# Patient Record
Sex: Female | Born: 1935
Health system: Southern US, Community
[De-identification: ages and names within clinical notes are randomized; demographics above are authoritative.]

## PROBLEM LIST (undated history)

## (undated) DIAGNOSIS — E079 Disorder of thyroid, unspecified: Secondary | ICD-10-CM

## (undated) DIAGNOSIS — I10 Essential (primary) hypertension: Secondary | ICD-10-CM

## (undated) HISTORY — PX: ABDOMINAL HYSTERECTOMY: SHX81

## (undated) HISTORY — PX: TONSILLECTOMY: SUR1361

## (undated) HISTORY — PX: CHOLECYSTECTOMY: SHX55

---

## 1998-05-02 ENCOUNTER — Emergency Department (HOSPITAL_COMMUNITY): Admission: EM | Admit: 1998-05-02 | Discharge: 1998-05-02 | Payer: Self-pay | Admitting: Emergency Medicine

## 2001-05-24 ENCOUNTER — Inpatient Hospital Stay (HOSPITAL_COMMUNITY): Admission: EM | Admit: 2001-05-24 | Discharge: 2001-05-25 | Payer: Self-pay | Admitting: *Deleted

## 2001-05-24 ENCOUNTER — Encounter: Payer: Self-pay | Admitting: *Deleted

## 2001-05-25 ENCOUNTER — Encounter: Payer: Self-pay | Admitting: Cardiology

## 2002-07-26 ENCOUNTER — Other Ambulatory Visit: Admission: RE | Admit: 2002-07-26 | Discharge: 2002-07-26 | Payer: Self-pay | Admitting: Dermatology

## 2002-07-31 ENCOUNTER — Ambulatory Visit (HOSPITAL_COMMUNITY): Admission: RE | Admit: 2002-07-31 | Discharge: 2002-07-31 | Payer: Self-pay | Admitting: Family Medicine

## 2002-07-31 ENCOUNTER — Encounter: Payer: Self-pay | Admitting: Family Medicine

## 2002-08-08 ENCOUNTER — Encounter: Payer: Self-pay | Admitting: Family Medicine

## 2002-08-08 ENCOUNTER — Ambulatory Visit (HOSPITAL_COMMUNITY): Admission: RE | Admit: 2002-08-08 | Discharge: 2002-08-08 | Payer: Self-pay | Admitting: Family Medicine

## 2002-11-08 ENCOUNTER — Encounter: Payer: Self-pay | Admitting: Family Medicine

## 2002-11-08 ENCOUNTER — Ambulatory Visit (HOSPITAL_COMMUNITY): Admission: RE | Admit: 2002-11-08 | Discharge: 2002-11-08 | Payer: Self-pay | Admitting: Family Medicine

## 2003-08-21 ENCOUNTER — Ambulatory Visit (HOSPITAL_COMMUNITY): Admission: RE | Admit: 2003-08-21 | Discharge: 2003-08-21 | Payer: Self-pay | Admitting: Family Medicine

## 2005-02-06 ENCOUNTER — Ambulatory Visit (HOSPITAL_COMMUNITY): Admission: RE | Admit: 2005-02-06 | Discharge: 2005-02-06 | Payer: Self-pay | Admitting: Orthopedic Surgery

## 2007-02-01 ENCOUNTER — Ambulatory Visit (HOSPITAL_COMMUNITY): Admission: RE | Admit: 2007-02-01 | Discharge: 2007-02-01 | Payer: Self-pay | Admitting: Family Medicine

## 2007-02-15 ENCOUNTER — Ambulatory Visit (HOSPITAL_COMMUNITY): Admission: RE | Admit: 2007-02-15 | Discharge: 2007-02-15 | Payer: Self-pay | Admitting: Family Medicine

## 2008-06-30 ENCOUNTER — Emergency Department (HOSPITAL_COMMUNITY): Admission: EM | Admit: 2008-06-30 | Discharge: 2008-06-30 | Payer: Self-pay | Admitting: Emergency Medicine

## 2009-05-08 ENCOUNTER — Ambulatory Visit (HOSPITAL_COMMUNITY): Admission: RE | Admit: 2009-05-08 | Discharge: 2009-05-08 | Payer: Self-pay | Admitting: Family Medicine

## 2009-05-15 ENCOUNTER — Ambulatory Visit (HOSPITAL_COMMUNITY): Admission: RE | Admit: 2009-05-15 | Discharge: 2009-05-15 | Payer: Self-pay | Admitting: Family Medicine

## 2009-05-17 ENCOUNTER — Encounter (INDEPENDENT_AMBULATORY_CARE_PROVIDER_SITE_OTHER): Payer: Self-pay | Admitting: General Surgery

## 2009-05-17 ENCOUNTER — Ambulatory Visit (HOSPITAL_COMMUNITY): Admission: RE | Admit: 2009-05-17 | Discharge: 2009-05-17 | Payer: Self-pay | Admitting: General Surgery

## 2010-03-04 ENCOUNTER — Ambulatory Visit (HOSPITAL_COMMUNITY): Admission: RE | Admit: 2010-03-04 | Discharge: 2010-03-04 | Payer: Self-pay | Admitting: Internal Medicine

## 2011-01-17 LAB — CBC
HCT: 39.7 % (ref 36.0–46.0)
Hemoglobin: 13.8 g/dL (ref 12.0–15.0)
MCHC: 34.7 g/dL (ref 30.0–36.0)
MCV: 88.7 fL (ref 78.0–100.0)
RBC: 4.47 MIL/uL (ref 3.87–5.11)
RDW: 13.7 % (ref 11.5–15.5)
WBC: 5.6 10*3/uL (ref 4.0–10.5)

## 2011-01-17 LAB — HEPATIC FUNCTION PANEL
ALT: 21 U/L (ref 0–35)
Alkaline Phosphatase: 67 U/L (ref 39–117)

## 2011-01-17 LAB — BASIC METABOLIC PANEL
BUN: 13 mg/dL (ref 6–23)
Calcium: 9.9 mg/dL (ref 8.4–10.5)
Chloride: 101 mEq/L (ref 96–112)
Glucose, Bld: 113 mg/dL — ABNORMAL HIGH (ref 70–99)

## 2011-02-24 NOTE — Op Note (Signed)
NAMESHADE, RIVENBARK               ACCOUNT NO.:  [id number]   MEDICAL RECORD NO.:  [id number]          PATIENT TYPE:  AMB   LOCATION:  DAY                           FACILITY:  [facility]   PHYSICIAN:  Dalia Heading, M.D.  DATE OF BIRTH:  [birth year]   DATE OF PROCEDURE:  05/17/2009  DATE OF DISCHARGE:                               OPERATIVE REPORT   PREOPERATIVE DIAGNOSES:  Cholecystitis and cholelithiasis.   POSTOPERATIVE DIAGNOSIS:  Cholecystitis and cholelithiasis.   PROCEDURE:  Laparoscopic cholecystectomy.   SURGEON:  Dalia Heading, MD   ANESTHESIA:  General endotracheal.   INDICATIONS:  The patient is a 75 year old white female who presents  with biliary colic secondary to cholelithiasis.  The risks and benefits  of the procedure including bleeding, infection, hepatobiliary injury,  the possibly of an open procedure were fully explained to the patient,  gave informed consent.   PROCEDURE NOTE:  The patient was placed in the supine position.  After  induction of general endotracheal anesthesia, the abdomen was prepped  and draped in the usual sterile technique with Betadine.  Surgical site  confirmation was performed.   An infraumbilical incision was made down to the fascia.  A Veress needle  was introduced into the abdominal cavity and confirmation of placement  was done using the saline drop test.  The abdomen was then insufflated  to 16 mmHg pressure.  An 11-mm trocar was introduced into the abdominal  cavity under direct visualization without difficulty.  The patient was  placed in reverse Trendelenburg position.  Additional 11-mm trocar was  placed in the epigastric region, and 5-mm trocar was placed in the right  upper quadrant and right flank regions.  Liver was inspected and noted  to be within normal limits.  The gallbladder was retracted superior and  laterally.  The dissection was begun around the infundibulum of the  gallbladder.  The cystic duct was first  identified.  Its juncture to the  infundibulum fully identified.  EndoClips were placed proximally and  distally on the cystic duct, and the cystic duct was divided.  This was  likewise done in the cystic artery.  The gallbladder was then freed away  from the gallbladder fossa using Bovie electrocautery.  The gallbladder  was delivered through the epigastric trocar site using Endo Catch bag.  The gallbladder fossa was inspected.  No abnormal bleeding or bile  leakage was noted.  Surgicel was placed in the gallbladder fossa.  All  fluid and air were then evacuated from the abdominal cavity prior to the  removal of the trocars.   All wounds were irrigated with normal saline.  All wounds were injected  with 0.5% Sensorcaine.  The infraumbilical fascia was reapproximated  using an 0 Vicryl interrupted suture.  All skin incisions were closed  using staples.  Betadine ointment and dry sterile dressings were  applied.   All tape and needle counts were correct at the end of the procedure.  The patient was extubated in the operating room and went back to  recovery room awake in stable condition.   COMPLICATIONS:  None.   SPECIMEN:  Gallbladder.   BLOOD LOSS:  Minimal.      Dalia Heading, M.D.  Electronically Signed     MAJ/MEDQ  D:  05/17/2009  T:  05/17/2009  Job:  [id number]   cc:   Kirk Ruths, M.D.  Fax: [phone]

## 2011-02-24 NOTE — H&P (Signed)
NAMENECHELLE, Sherry Waller               ACCOUNT NO.:  [id number]   MEDICAL RECORD NO.:  [id number]          PATIENT TYPE:  AMB   LOCATION:  DAY                           FACILITY:  [facility]   PHYSICIAN:  Dalia Heading, M.D.  DATE OF BIRTH:  1935/10/20   DATE OF ADMISSION:  DATE OF DISCHARGE:  LH                              HISTORY & PHYSICAL   CHIEF COMPLAINT:  Cholecystitis, cholelithiasis.   HISTORY OF PRESENT ILLNESS:  The patient is a 75 year old white female  who is referred for evaluation and treatment of biliary colic secondary  to cholelithiasis.  She has been having intermittent right upper  quadrant abdominal pain with radiation to the flank and right shoulder,  nausea, and indigestion for the past few months.  It is made worse with  fatty foods.  No fever, chills, jaundice have been noted.   PAST MEDICAL HISTORY:  Hypertension.   PAST SURGICAL HISTORY:  Hysterectomy.   CURRENT MEDICATIONS:  1. Lopressor 100 mg p.o. daily.  2. Lisinopril 20 mg p.o. daily.   ALLERGIES:  1. CODEINE.  2. SULFA.   REVIEW OF SYSTEMS:  Noncontributory.   PHYSICAL EXAMINATION:  GENERAL:  On physical examination, the patient is  a well-developed, well-nourished white female in no acute distress.  HEENT: Examination reveals no scleral icterus.  LUNGS:  Clear to auscultation with equal breath sounds bilaterally.  HEART:  Examination reveals regular rate and rhythm without S3, S4, or  murmurs.  ABDOMEN:  The abdomen is soft with tenderness on the right upper  quadrant to palpation.  No hepatosplenomegaly, masses, hernias are  identified.   Ultrasound of the gallbladder reveals cholelithiasis with a normal  common bile duct.   IMPRESSION:  Cholecystitis, cholelithiasis.   PLAN:  The patient is scheduled for laparoscopic cholecystectomy on  May 17, 2009.  The risks and benefits of the procedure including  bleeding, infection, hepatobiliary injury, the possibly of an open  procedure were  fully explained to the patient, who gave informed  consent.      Dalia Heading, M.D.  Electronically Signed     MAJ/MEDQ  D:  05/16/2009  T:  05/16/2009  Job:  [id number]   cc:   Kirk Ruths, M.D.  Fax: [phone]

## 2011-02-27 NOTE — Discharge Summary (Signed)
[place]. [facility]  Patient:    Sherry Waller, Sherry Waller Visit Number: [id number] MRN: [id number]          Service Type: Attending:  Alvester Morin, M.D. Dictated by:   Lendell Caprice, M.D. Adm. Date:  05/24/01 Disc. Date: 05/25/01   CC:         Rollene Rotunda, M.D. [facility]  Karleen Hampshire, M.D., [place], [place]   Discharge Summary   DISCHARGE DIAGNOSIS: 1. Atypical chest pain, current hospitalization, likely musculoskeletal in nature. 2. Hypertension - poorly controlled. 3. S/P Hysterectomy. 4. Hyperglycemia Hgb Alc 5.3. 5. Hyperlipidemia LDL 138, HDL 41. (current hospitalization)  DISCHARGE MEDICATIONS: 1. Altace 5 mg p.o. q.d. 2. Lopressor 25 mg 1 p.o. b.i.d. 3. Zantac 150 mg 1 p.o. b.i.d. 4. Aspirin 325 mg 1 p.o. q.d. 5. Tylenol 325 mg 2 tabs p.o. q.4-6h. p.r.n. pain.  FOLLOWUP:  The patient will follow up with her primary care physician, Dr. Karleen Hampshire, M.D. [place] on [place] 20 at 10 a.m. She was also given the number to our outpatient internal medicine clinic, [phone] and told to call that clinic if she should have any problems.  In the meantime, the patients glucose will need to be followed in an outpatient setting.  Her hemoglobin A1c was 5.3.  PROCEDURES AND DIAGNOSTIC STUDIES: 1. On May 24, 2001, a chest x-ray was performed which showed no acute    process, no acute active disease. 2. Cardiolite stress test was also performed on May 25, 2001, which showed    an ejection fraction of 67% and no evidence of ischemia.  There was some    evidence of attenuation secondary to breast tissue.  This was a normal    study per the cardiologist.  The exercise stress test was clinically and    electrically negative.  CONSULTANTS:  Dr. Antoine Poche in cardiology.  HISTORY OF PRESENT ILLNESS:  The patient is a 75 year old white female with a history of hypertension uncontrolled.  She presents with right-sided chest pain that  radiates to her back.  She rates it as a 6 out of 10 and describes it as sudden onset.  She has no associated diaphoresis, nausea, vomiting, jaw pain, or palpitations.  She first noticed her back pain last week.  On the night of May 22, 2001, she had chest pain as well and states that she could not sleep because of this pain.  She states that the pain is reproducible if she presses on her chest and she feels that she could stretch it out if she could.  She denies any shortness of breath or dyspnea on exertion.  She has never had any pain like this before in her past.  Her current risk factors include history of high cholesterol and uncontrolled hypertension.  ADMISSION LABORATORY DATA:  CBC, white blood count 6.0, hemoglobin 13.8 and hematocrit 42.3, MCV 84.2, RDW 11.8, platelets 239, CK 128, CK-MB 0.6, troponin less than 0.01, second and third sets of enzymes were also negative. PT 12.3, INR 0.9, PTT 29.  Lipid profile, cholesterol 204, triglycerides 125, HDL 41, LDL 138, D-dimer test less than 0.22, hepatic functions normal, total bili 0.5, direct bili 0, indirect 0.5, alk phos 93, SGOT 19, SGPT 13, total protein 6.4, albumin 3.5, hemoglobin A1c 5.3.  HOSPITAL COURSE: #1 - CARDIAC/CHEST PAIN:  The patient was evaluated in the emergency room and admitted to the medicine service for acute workup of chest pain and rule out of acute myocardial infarction.  Enzymes were all negative x 3 and her EKG showed no changes and was normal.  She was started on a heparin and nitroglycerin drip.  She was also given Morphine for pain and baby aspirin. The patient was seen and evaluated by cardiology and a Cardiolite study was order for May 25, 2001.  D-dimer was obtained, however, the suspicion for a pulmonary embolus was very low.  This was negative.  A lipid profile was also checked and the following LDL of 138 was obtained.  No medication was recommended at this time.  The patient agreed to  management with diet and exercise in the outpatient setting.  The results of the Cardiolite stress test were normal.  Her chest pain was likely musculoskeletal in nature.  She was given Tylenol p.r.n. for pain and instructed to follow up in the outpatient setting with her family physician.  #2 - UNCONTROLLED HYPERTENSION:  The patient states that in the past she has been on Plendil, prescribed by her physician, but she has actually never filled this medication.  She was instructed on the importance of taking a medication for her uncontrolled blood pressure.  She was started on Lopressor 25 mg p.o. b.i.d. during her hospitalization.  Her blood pressures ranged in the 130s to the 170s during her stay.  She was discharged on this medication and also started on Altace 5 mg p.o. q.d. for her hypertension.  This will need to be followed in the outpatient setting by Dr. Regino Schultze in [place]. He will need to check her electrolytes and renal function after staring the ACE inhibitor.  #3 - HYPERGLYCEMIA:  Patient has no history of diabetes and denies any polyuria or polydipsia or blurry vision.  Hemoglobin A1c was encouraging at the value of 5.3.  This will need to be followed in the outpatient setting as well.  #4 - HYPERLIPIDEMIA:  The patients LDL should be followed in the outpatient setting.  She will begin to watch her diet and eat low cholesterol foods.  She also states that she will continue to exercise daily.  This will need to be followed in the outpatient setting.  The patient was also started on aspirin 325 mg p.o. q.d.  We also prescribed Zantac 150 mg p.o. b.i.d. for probable dyspepsia.  DISCHARGE LABORATORY DATA:  White blood count 7.0, hemoglobin 13.1, hematocrit 37.9, MCV 83.7, RDW 12.1, platelet count 239, PTT 76. Dictated by:   Lendell Caprice, M.D. Attending:  Alvester Morin, M.D. DD:  05/25/01 TD:  05/25/01  Job: [id number]

## 2011-04-20 ENCOUNTER — Other Ambulatory Visit: Payer: Self-pay

## 2011-04-20 ENCOUNTER — Emergency Department (HOSPITAL_COMMUNITY): Payer: Medicare Other

## 2011-04-20 ENCOUNTER — Emergency Department (HOSPITAL_COMMUNITY)
Admission: EM | Admit: 2011-04-20 | Discharge: 2011-04-20 | Disposition: A | Discharge status: Home / Self Care | Payer: Medicare Other | Attending: Emergency Medicine | Admitting: Emergency Medicine

## 2011-04-20 DIAGNOSIS — T6391XA Toxic effect of contact with unspecified venomous animal, accidental (unintentional), initial encounter: Secondary | ICD-10-CM | POA: Insufficient documentation

## 2011-04-20 DIAGNOSIS — T63461A Toxic effect of venom of wasps, accidental (unintentional), initial encounter: Secondary | ICD-10-CM | POA: Insufficient documentation

## 2011-04-20 DIAGNOSIS — I10 Essential (primary) hypertension: Secondary | ICD-10-CM | POA: Insufficient documentation

## 2011-04-20 DIAGNOSIS — IMO0001 Reserved for inherently not codable concepts without codable children: Secondary | ICD-10-CM

## 2011-04-20 HISTORY — DX: Essential (primary) hypertension: I10

## 2011-04-20 MED ORDER — SODIUM CHLORIDE 0.9 % IV SOLN
INTRAVENOUS | Status: DC
  Administered 2011-04-20 (×2): via INTRAVENOUS

## 2011-04-20 MED ORDER — DEXAMETHASONE SODIUM PHOSPHATE 10 MG/ML IJ SOLN
10.0000 mg | Freq: Once | INTRAMUSCULAR | Status: DC
  Filled 2011-04-20: qty 1

## 2011-04-20 MED ORDER — DEXAMETHASONE SODIUM PHOSPHATE 10 MG/ML IJ SOLN: 10.0000 mg | Freq: Once | INTRAMUSCULAR | Status: DC

## 2011-04-20 MED ORDER — DIPHENHYDRAMINE HCL 25 MG PO CAPS: 25.0000 mg | ORAL_CAPSULE | Freq: Four times a day (QID) | ORAL | Status: AC | PRN

## 2011-04-20 MED ORDER — DEXAMETHASONE SODIUM PHOSPHATE 4 MG/ML IJ SOLN
10.0000 mg | Freq: Once | INTRAMUSCULAR | Status: AC
  Administered 2011-04-20: 10 mg via INTRAVENOUS
  Filled 2011-04-20: qty 3

## 2011-04-20 MED ORDER — PREDNISONE 10 MG PO TABS: ORAL_TABLET | ORAL | Status: DC

## 2011-04-20 MED ORDER — DIPHENHYDRAMINE HCL 50 MG/ML IJ SOLN
50.0000 mg | Freq: Once | INTRAMUSCULAR | Status: AC
  Administered 2011-04-20: 50 mg via INTRAVENOUS
  Filled 2011-04-20: qty 1

## 2011-04-20 MED ORDER — LORAZEPAM 1 MG PO TABS
1.0000 mg | ORAL_TABLET | Freq: Once | ORAL | Status: AC
  Administered 2011-04-20: 1 mg via ORAL
  Filled 2011-04-20: qty 1

## 2011-04-20 MED ORDER — FAMOTIDINE IN NACL 20-0.9 MG/50ML-% IV SOLN
20.0000 mg | Freq: Once | INTRAVENOUS | Status: AC
  Administered 2011-04-20: 20 mg via INTRAVENOUS
  Filled 2011-04-20: qty 50

## 2011-04-20 NOTE — ED Provider Notes (Addendum)
History     Chief Complaint  Patient presents with  . Insect Bite   HPI Comments: Patient was stung by a yellow jacket on her right index finger and hand about 30 minutes prior to arrival. She reports redness and swelling to hand and as generalized itching.  She also describes feeling a "knot" in her mid sternal area,  Describing it feels like she swallowed a piece of apple that "got stuck".  She denies shortness of breath,  Denies diaphoresis, no wheezing.  She reports having a history of "allergic reactions" to bee stings.  She denies history of anaphylaxis.  Patient is a 75 y.o. female presenting with allergic reaction. The history is provided by the patient.  Allergic Reaction The primary symptoms do not include wheezing, shortness of breath or cough. The current episode started less than 1 hour ago. The problem has not changed since onset. The onset of the reaction was associated with insect bite/sting.    Past Medical History  Diagnosis Date  . Hypertension     Past Surgical History  Procedure Date  . Cholecystectomy   . Abdominal hysterectomy     History reviewed. No pertinent family history.  History  Substance Use Topics  . Smoking status: Never Smoker   . Smokeless tobacco: Not on file  . Alcohol Use: No    OB History    Grav Para Term Preterm Abortions TAB SAB Ect Mult Living                  Review of Systems  Constitutional: Negative.   HENT: Negative for facial swelling.   Eyes: Negative.   Respiratory: Negative for apnea, cough, choking, shortness of breath, wheezing and stridor.   Gastrointestinal: Negative.   Genitourinary: Negative.   Neurological: Negative.   Hematological: Negative.   Psychiatric/Behavioral: Negative.     Physical Exam  BP 196/76  Pulse 88  Temp(Src) 97.6 F (36.4 C) (Oral)  Resp 24  SpO2 94%  Physical Exam  Constitutional: She is oriented to person, place, and time. She appears well-developed and well-nourished.    HENT:  Head: Normocephalic and atraumatic.  Neck: Normal range of motion. Neck supple.  Cardiovascular: Normal rate, regular rhythm, normal heart sounds and intact distal pulses.   Pulmonary/Chest: Effort normal and breath sounds normal.  Abdominal: Bowel sounds are normal.  Musculoskeletal: She exhibits edema and tenderness.       Right wrist: She exhibits tenderness and swelling.  Neurological: She is alert and oriented to person, place, and time.  Skin: Skin is warm and dry.  Psychiatric: Her mood appears anxious.    ED Course  Procedures  MDM Patient was given IV fluids,  Decadron 10 mg IV,  Benadryl 50 mg IV with no further sx and resolution of itching.  She was anxious, was given ativan 1 mg,  bp initially elevated,  Improved at dc.    Date: 04/20/2011  Rate: 79  Rhythm: normal sinus rhythm  QRS Axis: normal  Intervals: normal  ST/T Wave abnormalities: normal  Conduction Disutrbances:none  Narrative Interpretation:   Old EKG Reviewed: unchanged    Candis Musa, PA 04/20/11 1812  Candis Musa, PA 04/20/11 1830  Medical screening examination/treatment/procedure(s) were performed by non-physician practitioner and as supervising physician I was immediately available for consultation/collaboration.   Sunnie Nielsen, MD 05/21/11 [phone]

## 2011-04-20 NOTE — ED Notes (Signed)
Patient resting quietly in bed. Patient moved to room 6. Airway patent. Respirations even and nonlabored. No acute distress noted. No verbalized requests given.

## 2011-04-20 NOTE — ED Notes (Signed)
Pt presents with right hand swelling and redness, chest tightness, and SOB, itching "all over". Pt was stung by bee and pt allergic to bees.

## 2011-04-21 ENCOUNTER — Other Ambulatory Visit (HOSPITAL_COMMUNITY): Payer: Self-pay | Admitting: Family Medicine

## 2011-04-21 DIAGNOSIS — Z139 Encounter for screening, unspecified: Secondary | ICD-10-CM

## 2011-04-23 ENCOUNTER — Ambulatory Visit (HOSPITAL_COMMUNITY)
Admission: RE | Admit: 2011-04-23 | Discharge: 2011-04-23 | Disposition: A | Discharge status: Home / Self Care | Payer: Medicare Other | Source: Ambulatory Visit | Attending: Family Medicine | Admitting: Family Medicine

## 2011-04-23 DIAGNOSIS — Z1231 Encounter for screening mammogram for malignant neoplasm of breast: Secondary | ICD-10-CM | POA: Insufficient documentation

## 2011-04-23 DIAGNOSIS — Z139 Encounter for screening, unspecified: Secondary | ICD-10-CM

## 2011-05-27 ENCOUNTER — Encounter (INDEPENDENT_AMBULATORY_CARE_PROVIDER_SITE_OTHER): Payer: Self-pay | Admitting: *Deleted

## 2012-07-25 ENCOUNTER — Ambulatory Visit
Admission: RE | Admit: 2012-07-25 | Discharge: 2012-07-25 | Disposition: A | Discharge status: Home / Self Care | Payer: Medicare Other | Source: Ambulatory Visit | Attending: Chiropractor | Admitting: Chiropractor

## 2012-07-25 ENCOUNTER — Other Ambulatory Visit: Payer: Self-pay | Admitting: Chiropractic Medicine

## 2012-07-25 DIAGNOSIS — R52 Pain, unspecified: Secondary | ICD-10-CM

## 2013-04-19 ENCOUNTER — Other Ambulatory Visit (HOSPITAL_COMMUNITY): Payer: Self-pay | Admitting: Family Medicine

## 2013-04-19 DIAGNOSIS — Z139 Encounter for screening, unspecified: Secondary | ICD-10-CM

## 2013-04-24 ENCOUNTER — Ambulatory Visit (HOSPITAL_COMMUNITY)
Admission: RE | Admit: 2013-04-24 | Discharge: 2013-04-24 | Disposition: A | Discharge status: Home / Self Care | Payer: Medicare Other | Source: Ambulatory Visit | Attending: Family Medicine | Admitting: Family Medicine

## 2013-04-24 DIAGNOSIS — Z1231 Encounter for screening mammogram for malignant neoplasm of breast: Secondary | ICD-10-CM | POA: Insufficient documentation

## 2013-04-24 DIAGNOSIS — Z139 Encounter for screening, unspecified: Secondary | ICD-10-CM

## 2013-08-16 ENCOUNTER — Encounter (HOSPITAL_COMMUNITY): Payer: Self-pay | Admitting: Emergency Medicine

## 2013-08-16 ENCOUNTER — Emergency Department (HOSPITAL_COMMUNITY)
Admission: EM | Admit: 2013-08-16 | Discharge: 2013-08-16 | Disposition: A | Discharge status: Home / Self Care | Payer: Medicare Other | Attending: Emergency Medicine | Admitting: Emergency Medicine

## 2013-08-16 DIAGNOSIS — E079 Disorder of thyroid, unspecified: Secondary | ICD-10-CM | POA: Insufficient documentation

## 2013-08-16 DIAGNOSIS — Z79899 Other long term (current) drug therapy: Secondary | ICD-10-CM | POA: Insufficient documentation

## 2013-08-16 DIAGNOSIS — I10 Essential (primary) hypertension: Secondary | ICD-10-CM | POA: Insufficient documentation

## 2013-08-16 HISTORY — DX: Disorder of thyroid, unspecified: E07.9

## 2013-08-16 LAB — CBC WITH DIFFERENTIAL/PLATELET
Basophils Relative: 1 % (ref 0–1)
Eosinophils Absolute: 0.2 10*3/uL (ref 0.0–0.7)
Eosinophils Relative: 3 % (ref 0–5)
HCT: 40.8 % (ref 36.0–46.0)
Lymphs Abs: 1.4 10*3/uL (ref 0.7–4.0)
MCH: 29.8 pg (ref 26.0–34.0)
MCHC: 34.1 g/dL (ref 30.0–36.0)
MCV: 87.6 fL (ref 78.0–100.0)
Monocytes Relative: 9 % (ref 3–12)
Platelets: 239 10*3/uL (ref 150–400)
RBC: 4.66 MIL/uL (ref 3.87–5.11)
RDW: 12.8 % (ref 11.5–15.5)

## 2013-08-16 LAB — BASIC METABOLIC PANEL
BUN: 20 mg/dL (ref 6–23)
Calcium: 10.2 mg/dL (ref 8.4–10.5)
GFR calc non Af Amer: 45 mL/min — ABNORMAL LOW (ref >90)
Glucose, Bld: 126 mg/dL — ABNORMAL HIGH (ref 70–99)
Sodium: 136 mEq/L (ref 135–145)

## 2013-08-16 NOTE — ED Notes (Signed)
Pt reports that her potassium has been high in the past and she thinks may be related.

## 2013-08-16 NOTE — ED Provider Notes (Signed)
CSN: [id number]     Arrival date & time 08/16/13  1211 History   First MD Initiated Contact with Patient 08/16/13 1511    Scribed for No att. providers found, the patient was seen in room APA01/APA01. This chart was scribed by Lewanda Rife, ED scribe. Patient's care was started at 3:15 PM  Chief Complaint  Patient presents with  . Hypertension   (Consider location/radiation/quality/duration/timing/severity/associated sxs/prior Treatment) The history is provided by the patient and medical records. No language interpreter was used.   HPI Comments: Sherry Waller is a 77 y.o. female who presents to the Emergency Department with PMHx of HTN complaining of constant asymptomatic elevated blood pressure onset 13 days since annual physical with PCP 08/04/13. Reports systolic blood pressure has ranged from 160-180s since annual physical. Pt admits she did not check blood pressure regularly until appointment with PCP. Denies associated fever, chest pain, headaches, and shortness of breath. Denies any aggravating factors. Reports taking HTN medications as prescribed, but admits to having an extra dose of medications no relief of symptoms.  Past Medical History  Diagnosis Date  . Hypertension   . Thyroid disease    Past Surgical History  Procedure Laterality Date  . Cholecystectomy    . Abdominal hysterectomy     No family history on file. History  Substance Use Topics  . Smoking status: Never Smoker   . Smokeless tobacco: Not on file  . Alcohol Use: No   OB History   Grav Para Term Preterm Abortions TAB SAB Ect Mult Living                 Review of Systems  Constitutional: Negative for fever.  Respiratory: Negative for shortness of breath.   Cardiovascular: Negative for chest pain.  Neurological: Negative for weakness and headaches.  Psychiatric/Behavioral: Negative for confusion.  All other systems reviewed and are negative.   A complete 10 system review of systems was  obtained and all systems are negative except as noted in the HPI and PMHx.    Allergies  Sulfa antibiotics  Home Medications   Current Outpatient Rx  Name  Route  Sig  Dispense  Refill  . amLODipine-olmesartan (AZOR) 10-40 MG per tablet   Oral   Take 1 tablet by mouth daily.         Marland Kitchen levothyroxine (SYNTHROID, LEVOTHROID) 50 MCG tablet   Oral   Take 50 mcg by mouth daily before breakfast.         . metoprolol-hydrochlorothiazide (LOPRESSOR HCT) 100-25 MG per tablet   Oral   Take 1 tablet by mouth daily.          BP 187/68  Pulse 71  Temp(Src) 98.2 F (36.8 C) (Oral)  Resp 17  Ht 5\' 5"  (1.651 m)  Wt 165 lb (74.844 kg)  BMI 27.46 kg/m2  SpO2 98% Physical Exam  Nursing note and vitals reviewed. Constitutional: She is oriented to person, place, and time. She appears well-developed and well-nourished. No distress.  Awake, alert, nontoxic appearance with baseline speech for patient.  HENT:  Head: Normocephalic and atraumatic.  Mouth/Throat: No oropharyngeal exudate.  Eyes: EOM are normal. Pupils are equal, round, and reactive to light. Right eye exhibits no discharge. Left eye exhibits no discharge.  Neck: Neck supple. No tracheal deviation present.  Cardiovascular: Normal rate and regular rhythm.   No murmur heard. Pulmonary/Chest: Effort normal and breath sounds normal. No stridor. No respiratory distress. She has no wheezes. She has no rales. She  exhibits no tenderness.  Abdominal: Soft. Bowel sounds are normal. She exhibits no mass. There is no tenderness. There is no rebound.  Musculoskeletal: Normal range of motion. She exhibits no tenderness.  Baseline ROM, moves extremities with no obvious new focal weakness.  Lymphadenopathy:    She has no cervical adenopathy.  Neurological: She is alert and oriented to person, place, and time.  Awake, alert, cooperative and aware of situation; motor strength 5/5 bilaterally; sensation normal to light touch bilaterally;  peripheral visual fields full to confrontation; no facial asymmetry; tongue midline; major cranial nerves appear intact; no pronator drift, normal finger to nose bilaterally, baseline gait without new ataxia.  Skin: Skin is warm and dry. No rash noted.  Psychiatric: She has a normal mood and affect. Her behavior is normal.    ED Course  Procedures (including critical care time) 3:25 PM consult with PCP Dr. Phillips Odor to discuss pt's concerns about her blood pressure and possible change in dosage. Dr. Phillips Odor states he will f/u with pt tomorrow.  3:25 PM Nursing Notes Reviewed/ Care Coordinated  Reviewed Interpretation of Laboratory Data incorporated into ED treatment Discussed results and treatment plan with pt. Pt demonstrates understanding and agrees with plan.  Results for orders placed during the hospital encounter of 08/16/13  CBC WITH DIFFERENTIAL      Result Value Range   WBC 7.2  4.0 - 10.5 K/uL   RBC 4.66  3.87 - 5.11 MIL/uL   Hemoglobin 13.9  12.0 - 15.0 g/dL   HCT 04.5  40.9 - 81.1 %   MCV 87.6  78.0 - 100.0 fL   MCH 29.8  26.0 - 34.0 pg   MCHC 34.1  30.0 - 36.0 g/dL   RDW 91.4  78.2 - 95.6 %   Platelets 239  150 - 400 K/uL   Neutrophils Relative % 69  43 - 77 %   Neutro Abs 4.9  1.7 - 7.7 K/uL   Lymphocytes Relative 19  12 - 46 %   Lymphs Abs 1.4  0.7 - 4.0 K/uL   Monocytes Relative 9  3 - 12 %   Monocytes Absolute 0.6  0.1 - 1.0 K/uL   Eosinophils Relative 3  0 - 5 %   Eosinophils Absolute 0.2  0.0 - 0.7 K/uL   Basophils Relative 1  0 - 1 %   Basophils Absolute 0.1  0.0 - 0.1 K/uL  BASIC METABOLIC PANEL      Result Value Range   Sodium 136  135 - 145 mEq/L   Potassium 4.7  3.5 - 5.1 mEq/L   Chloride 99  96 - 112 mEq/L   CO2 28  19 - 32 mEq/L   Glucose, Bld 126 (*) 70 - 99 mg/dL   BUN 20  6 - 23 mg/dL   Creatinine, Ser 2.13 (*) 0.50 - 1.10 mg/dL   Calcium 08.6  8.4 - 57.8 mg/dL   GFR calc non Af Amer 45 (*) >90 mL/min   GFR calc Af Amer 53 (*) >90 mL/min     Imaging Review No results found.  EKG Interpretation   None       MDM   1. Hypertension    I doubt any other EMC precluding discharge at this time including, but not necessarily limited to the following:HTN crisis.  I personally performed the services described in this documentation, which was scribed in my presence. The recorded information has been reviewed and is accurate.    Hurman Horn, MD  08/22/13 2129 

## 2013-08-16 NOTE — ED Notes (Signed)
Pt reports her bp being high at home today. She thinks is has been high since oct. 24th.   Her machine would not read at home this am. She took extra dose of her bp med at home prior to coming to the ed.

## 2013-08-16 NOTE — ED Notes (Signed)
Discharge instructions reviewed with pt, questions answered. Pt verbalized understanding.  

## 2014-01-24 ENCOUNTER — Other Ambulatory Visit (HOSPITAL_COMMUNITY): Payer: Self-pay | Admitting: Family Medicine

## 2014-01-24 DIAGNOSIS — Z139 Encounter for screening, unspecified: Secondary | ICD-10-CM

## 2014-02-02 ENCOUNTER — Ambulatory Visit (HOSPITAL_COMMUNITY)
Admission: RE | Admit: 2014-02-02 | Discharge: 2014-02-02 | Disposition: A | Discharge status: Home / Self Care | Payer: Medicare Other | Source: Ambulatory Visit | Attending: Family Medicine | Admitting: Family Medicine

## 2014-02-02 DIAGNOSIS — Z139 Encounter for screening, unspecified: Secondary | ICD-10-CM

## 2014-11-29 ENCOUNTER — Other Ambulatory Visit (HOSPITAL_COMMUNITY): Payer: Self-pay | Admitting: Family Medicine

## 2014-11-29 DIAGNOSIS — Z1231 Encounter for screening mammogram for malignant neoplasm of breast: Secondary | ICD-10-CM

## 2014-12-05 ENCOUNTER — Ambulatory Visit (HOSPITAL_COMMUNITY)
Admission: RE | Admit: 2014-12-05 | Discharge: 2014-12-05 | Disposition: A | Discharge status: Home / Self Care | Payer: Medicare Other | Source: Ambulatory Visit | Attending: Family Medicine | Admitting: Family Medicine

## 2014-12-05 DIAGNOSIS — Z1231 Encounter for screening mammogram for malignant neoplasm of breast: Secondary | ICD-10-CM

## 2015-04-18 ENCOUNTER — Ambulatory Visit (INDEPENDENT_AMBULATORY_CARE_PROVIDER_SITE_OTHER): Payer: Medicare Other | Admitting: Podiatry

## 2015-04-18 ENCOUNTER — Encounter: Payer: Self-pay | Admitting: Podiatry

## 2015-04-18 ENCOUNTER — Ambulatory Visit (INDEPENDENT_AMBULATORY_CARE_PROVIDER_SITE_OTHER): Payer: Medicare Other

## 2015-04-18 VITALS — BP 172/66 | HR 70 | Resp 12

## 2015-04-18 DIAGNOSIS — M779 Enthesopathy, unspecified: Secondary | ICD-10-CM | POA: Diagnosis not present

## 2015-04-18 DIAGNOSIS — R52 Pain, unspecified: Secondary | ICD-10-CM | POA: Diagnosis not present

## 2015-04-18 DIAGNOSIS — G629 Polyneuropathy, unspecified: Secondary | ICD-10-CM | POA: Diagnosis not present

## 2015-04-18 NOTE — Progress Notes (Signed)
   Subjective:    Patient ID: Sherry Waller, female    DOB: [birth year], 79 y.o.   MRN: [id number]  HPI Pt stated b/l feet having numbness feeling and weakness on the ankle for 4 years. Feet are getting worse when sitting/standing. Tried ice but no help.    Review of Systems  Musculoskeletal: Positive for gait problem.  All other systems reviewed and are negative.      Objective:   Physical Exam        Assessment & Plan:

## 2015-04-18 NOTE — Progress Notes (Signed)
Subjective:     Patient ID: Sherry Waller, female   DOB: April 08, 1936, 79 y.o.   MRN: [id number]  HPI patient presents stating I have had numbness in my forefeet for about 4 years and I did go to a chiropractor who was not able to help me and it seems to be getting worse but I do not have at this time any balance issues   Review of Systems  All other systems reviewed and are negative.      Objective:   Physical Exam  Constitutional: She is oriented to person, place, and time.  Cardiovascular: Intact distal pulses.   Musculoskeletal: Normal range of motion.  Neurological: She is oriented to person, place, and time.  Skin: Skin is warm and dry.  Nursing note and vitals reviewed.  I noted neurological sensation to be severely diminished with diminished sharp Dole vibratory bilateral and sharp dull testing. Patient's noted to have moderate cavus foot type has good digital perfusion and is well oriented 3     Assessment:     Idiopathic neuropathy with failure so far to respond to conservative care    Plan:     H&P and x-rays reviewed and I do not recommend aggressive treatment as she only has numbness and no burning no tingling or balance issues. May require balance braces at one point in the future if she starts to lose her balance and I instructed her on proprioception exercises

## 2016-02-17 DIAGNOSIS — M13879 Other specified arthritis, unspecified ankle and foot: Secondary | ICD-10-CM | POA: Diagnosis not present

## 2016-02-17 DIAGNOSIS — G64 Other disorders of peripheral nervous system: Secondary | ICD-10-CM | POA: Diagnosis not present

## 2016-02-17 DIAGNOSIS — M25571 Pain in right ankle and joints of right foot: Secondary | ICD-10-CM | POA: Diagnosis not present

## 2016-02-26 DIAGNOSIS — M9902 Segmental and somatic dysfunction of thoracic region: Secondary | ICD-10-CM | POA: Diagnosis not present

## 2016-02-26 DIAGNOSIS — M5441 Lumbago with sciatica, right side: Secondary | ICD-10-CM | POA: Diagnosis not present

## 2016-02-26 DIAGNOSIS — M9905 Segmental and somatic dysfunction of pelvic region: Secondary | ICD-10-CM | POA: Diagnosis not present

## 2016-02-26 DIAGNOSIS — M9903 Segmental and somatic dysfunction of lumbar region: Secondary | ICD-10-CM | POA: Diagnosis not present

## 2016-03-02 DIAGNOSIS — M9903 Segmental and somatic dysfunction of lumbar region: Secondary | ICD-10-CM | POA: Diagnosis not present

## 2016-03-02 DIAGNOSIS — M9905 Segmental and somatic dysfunction of pelvic region: Secondary | ICD-10-CM | POA: Diagnosis not present

## 2016-03-02 DIAGNOSIS — M9902 Segmental and somatic dysfunction of thoracic region: Secondary | ICD-10-CM | POA: Diagnosis not present

## 2016-03-02 DIAGNOSIS — M5441 Lumbago with sciatica, right side: Secondary | ICD-10-CM | POA: Diagnosis not present

## 2016-03-10 DIAGNOSIS — M5441 Lumbago with sciatica, right side: Secondary | ICD-10-CM | POA: Diagnosis not present

## 2016-03-10 DIAGNOSIS — M9902 Segmental and somatic dysfunction of thoracic region: Secondary | ICD-10-CM | POA: Diagnosis not present

## 2016-03-10 DIAGNOSIS — M9903 Segmental and somatic dysfunction of lumbar region: Secondary | ICD-10-CM | POA: Diagnosis not present

## 2016-03-10 DIAGNOSIS — M9905 Segmental and somatic dysfunction of pelvic region: Secondary | ICD-10-CM | POA: Diagnosis not present

## 2016-03-18 DIAGNOSIS — L255 Unspecified contact dermatitis due to plants, except food: Secondary | ICD-10-CM | POA: Diagnosis not present

## 2016-03-18 DIAGNOSIS — Z1389 Encounter for screening for other disorder: Secondary | ICD-10-CM | POA: Diagnosis not present

## 2016-03-18 DIAGNOSIS — L299 Pruritus, unspecified: Secondary | ICD-10-CM | POA: Diagnosis not present

## 2016-03-18 DIAGNOSIS — E663 Overweight: Secondary | ICD-10-CM | POA: Diagnosis not present

## 2016-03-18 DIAGNOSIS — Z6827 Body mass index (BMI) 27.0-27.9, adult: Secondary | ICD-10-CM | POA: Diagnosis not present

## 2016-06-12 DIAGNOSIS — Z1389 Encounter for screening for other disorder: Secondary | ICD-10-CM | POA: Diagnosis not present

## 2016-06-12 DIAGNOSIS — R7309 Other abnormal glucose: Secondary | ICD-10-CM | POA: Diagnosis not present

## 2016-06-12 DIAGNOSIS — E039 Hypothyroidism, unspecified: Secondary | ICD-10-CM | POA: Diagnosis not present

## 2016-06-12 DIAGNOSIS — R200 Anesthesia of skin: Secondary | ICD-10-CM | POA: Diagnosis not present

## 2016-06-12 DIAGNOSIS — I10 Essential (primary) hypertension: Secondary | ICD-10-CM | POA: Diagnosis not present

## 2016-06-12 DIAGNOSIS — E782 Mixed hyperlipidemia: Secondary | ICD-10-CM | POA: Diagnosis not present

## 2016-06-12 DIAGNOSIS — Z6827 Body mass index (BMI) 27.0-27.9, adult: Secondary | ICD-10-CM | POA: Diagnosis not present

## 2016-07-09 ENCOUNTER — Ambulatory Visit (INDEPENDENT_AMBULATORY_CARE_PROVIDER_SITE_OTHER): Payer: Medicare HMO | Admitting: Neurology

## 2016-07-09 ENCOUNTER — Encounter: Payer: Self-pay | Admitting: Neurology

## 2016-07-09 VITALS — BP 170/72 | HR 72 | Resp 16 | Ht 65.0 in | Wt 161.0 lb

## 2016-07-09 DIAGNOSIS — M79604 Pain in right leg: Secondary | ICD-10-CM

## 2016-07-09 DIAGNOSIS — M79605 Pain in left leg: Secondary | ICD-10-CM

## 2016-07-09 DIAGNOSIS — G629 Polyneuropathy, unspecified: Secondary | ICD-10-CM

## 2016-07-09 MED ORDER — GABAPENTIN 100 MG PO CAPS: ORAL_CAPSULE | ORAL | 5 refills | Status: DC

## 2016-07-09 NOTE — Progress Notes (Signed)
Subjective:    Patient ID: Sherry Waller is a 80 y.o. female.  HPI     Star Age, MD, PhD [facility] [address] [place], [place] [address]  Dear Dr. Hilma Favors,   I saw your patient, Sherry Waller, upon your kind request in my neurologic clinic today for initial consultation of her neuropathy. The patient is accompanied by her husband today today. As you know, Sherry Waller is a 80 year old right-handed woman with an underlying medical history of hypertension, hyperlipidemia, elevated blood sugar levels and hypothyroidism, who reports numbness and intermittent pain in both distal lower extremities for the past 5 years. Patient has had a borderline A1c for years, currently 6.1, per patient.  Symptoms started inBoth feet, in the forefoot area and toes areas with numbness and gradually over the course of years she felt the numbness affected her up to the ankles the mid shin area and now possibly up to the knees. She does recall that her mom had nerve related issues and reported numbness in her feet and legs but typically no pain. Patient denies any restless leg symptoms and pain is usually intermittent but when it does hurt it hurts really badly including sharp stabbing and knifelike pain. This can last up to 3 days at a time. She does not have pain every day thankfully. She tries to stay active and likes to walk. She denies low back pain and major joint pain. 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 She does not drink alcohol and drinks caffeine very limited, nonsmoker, never a heavy drinker. She has 8 siblings, 2 passed away, no sibling with neuropathy as far as she can tell.  Her weight has been stable typically. No history of Lyme disease. No history of toxin exposure as far she can recall.  I reviewed your office note from 06/12/2016, which you kindly included. She had blood work in your office at the time  including CBC with differential, CMP, A1c, lipid panel, TSH, vitamin B12 and vitamin D, with results pending, we will request test results from your office. She had seen a chiropractor in the past. Of note, she had acupuncture recently for the pain and she felt it helped. She has seen a podiatrist recently in July as well.  Her Past Medical History Is Significant For: Past Medical History:  Diagnosis Date  . Hypertension   . Thyroid disease     Her Past Surgical History Is Significant For: Past Surgical History:  Procedure Laterality Date  . ABDOMINAL HYSTERECTOMY    . CHOLECYSTECTOMY    . TONSILLECTOMY      Her Family History Is Significant For: Family History  Problem Relation Age of Onset  . Brain cancer Father   . Ovarian cancer Sister   . Brain cancer Sister     Her Social History Is Significant For: Social History   Social History  . Marital status: Married    Spouse name: N/A  . Number of children: 3  . Years of education: 12+   Social History Main Topics  . Smoking status: Never Smoker  . Smokeless tobacco: Never Used  . Alcohol use No  . Drug use: No  . Sexual activity: Yes    Birth control/ protection: Surgical   Other Topics Concern  . None   Social History Narrative   Drinks 1 cup of coffee a day     Her Allergies Are:  Allergies  Allergen Reactions  . Fenofibrate Other (See Comments)    Aches  . Statins Itching  . Sulfa Antibiotics Other (See Comments)  :   Her Current Medications Are:  Outpatient Encounter Prescriptions as of 07/09/2016  Medication Sig  . Alpha-Lipoic Acid 100 MG CAPS Take by mouth.  Marland Kitchen amLODipine-olmesartan (AZOR) 10-40 MG per tablet Take 1 tablet by mouth daily.  . Fish Oil OIL by Does not apply route.  Marland Kitchen ibuprofen (ADVIL,MOTRIN) 200 MG tablet Take 200 mg by mouth as needed.  Marland Kitchen levothyroxine (SYNTHROID, LEVOTHROID) 50 MCG tablet Take 50 mcg by mouth daily before breakfast.  . metoprolol-hydrochlorothiazide (LOPRESSOR  HCT) 100-25 MG per tablet Take 1 tablet by mouth daily.  . traMADol-acetaminophen (ULTRACET) 37.5-325 MG tablet    No facility-administered encounter medications on file as of 07/09/2016.   :   Review of Systems:  Out of a complete 14 point review of systems, all are reviewed and negative with the exception of these symptoms as listed below:  Review of Systems  Neurological:       Patient reports that she has neuropathy in both of her feet and legs. She has numbness up to her knees. Sherry Waller states that this has been going on for about 5 years.     Objective:  Neurologic Exam  Physical Exam Physical Examination:   Vitals:   07/09/16 1310  BP: (!) 170/72  Pulse: 72  Resp: 16    General Examination: The patient is a very pleasant 80 y.o. female in no acute distress. She appears well-developed and well-nourished and well groomed.   HEENT: Normocephalic, atraumatic, pupils are equal, round and reactive to light and accommodation. Funduscopic exam is normal with sharp disc margins noted. Extraocular tracking is good without limitation to gaze excursion or nystagmus noted. Normal smooth pursuit is noted. Hearing is grossly intact. Tympanic membranes are clear bilaterally. Face is symmetric with normal facial animation and normal facial sensation. Speech is clear with no dysarthria noted. There is no hypophonia. There is no lip, neck/head, jaw or voice tremor. Neck is supple with full range of passive and active motion. There are no carotid bruits on auscultation. Oropharynx exam reveals: mild mouth dryness, adequate dental hygiene  with full dentures on top and her own teeth on the bottom. Tongue protrudes centrally and palate elevates symmetrically.   Chest: Clear to auscultation without wheezing, rhonchi or crackles noted.  Heart: S1+S2+0, regular and normal without murmurs, rubs or gallops noted.   Abdomen: Soft, non-tender and non-distended with normal bowel sounds appreciated on  auscultation.  Extremities: There is no pitting edema in the distal lower extremities bilaterally. Pedal pulses are intact.  Skin: Warm and dry without trophic changes noted. There are no varicose veins, but she has prominent spider veins.  Musculoskeletal: exam reveals no obvious joint deformities, tenderness or joint swelling or erythema.   Neurologically:  Mental status: The patient is awake, alert and oriented in all 4 spheres. Her immediate and remote memory, attention, language skills and fund of knowledge are appropriate. There is no evidence of aphasia, agnosia, apraxia or anomia. Speech is clear with normal prosody and enunciation. Thought process is linear. Mood is normal and affect is normal.  Cranial nerves II - XII are as described above under HEENT exam. In addition: shoulder shrug is normal with equal shoulder height noted. Motor exam: Normal bulk, strength and tone is noted. There is no drift, tremor or rebound. Romberg is negative. Reflexes are 1-2+ in the  upper extremities, 1+ in the knees and absent in the ankles. Babinski: Toes are equivocal bilaterally. Fine motor skills and coordination: intact with normal finger taps, normal hand movements, normal rapid alternating patting, normal foot taps and normal foot agility.  Cerebellar testing: No dysmetria or intention tremor on finger to nose testing. Heel to shin is unremarkable bilaterally. There is no truncal or gait ataxia.  Sensory exam: intact to light touch, pinprick, vibration, temperature sense in the upperextremities but decrease to all modalities up to the knees bilaterally.  Gait, station and balance: She stands easily. No veering to one side is noted. No leaning to one side is noted. Posture is age-appropriate and stance is narrow based. Gait shows normal stride length and normal pace. No problems turning are noted. Tandem walk is slightly challenging for her, but good for age.  Assessment and Plan:   In summary, Sherry Waller is a very pleasant 80 y.o.-year old female  with an underlying medical history of hypertension, hyperlipidemia, elevated blood sugar levelsWith borderline A1c, and hypothyroidism, whose  history and physical exam are in keeping with peripheral neuropathy. Etiology could be in her case prediabetes her borderline diabetes. She had several blood tests in your office and we will request test results, we may add additional testing down the road. Patient is not very keen on any invasive testing at this time. Nevertheless, I suggested we proceed with EMG and nerve conduction study to further delineate her underlying problem. I talked to the patient and her husband at length about her symptoms, my findings and the diagnosis of peripheral neuropathy, I also pointed out to them that sometimes be do not find an etiology for this. We may consider additional blood work down the [place], skin biopsy, and lumbar puncture, however, she would like to avoid any procedures at this time but is agreeable to proceeding with EMG and nerve conduction testing. We will call her with her test results. She does not have pain every day which is fortunate. Nevertheless, she does have intermittent pain in this can be stabbing and quite severe. For this, I suggested as needed use of low-dose gabapentin 100 mg strength up to 3 pills a day. She was advised about potential side effects and limitations and expectations of the medication and provided a new prescription for this and written instructions. I would like to see her back in about 3 months, sooner as needed. We will be in touch as to her test results. I answered all their questions today and the patient and her husband were in agreement.  Thank you very much for allowing me to participate in the care of this nice patient. If I can be of any further assistance to you please do not hesitate to call me at [phone].  Sincerely,   Star Age, MD, PhD

## 2016-07-09 NOTE — Patient Instructions (Addendum)
You may have a condition called peripheral neuropathy, i. e. nerve damage. There is no specific treatment for most neuropathies. The most common cause for neuropathy is diabetes in this country, in which case, tight glucose control is key. Other causes include thyroid disease, and some vitamin deficiencies. Certain medications such as chemotherapy agents and other chemicals or toxins including alcohol can cause neuropathy. There are some genetic conditions or hereditary neuropathies. Typically patients will report a family history of neuropathy in those conditions. There are cases associated with cancers and autoimmune conditions. Most neuropathies are progressive unless a root cause can be found and treated. For most neuropathies there is no actual cure or reversing of symptoms. Painful neuropathy can be difficult to treat symptomatically, but there are some medications available to ease the symptoms. Electrophysiologic testing with nerve conduction velocity studies and EMG (muscle testing) do not always pick up neuropathies that affect the smallest fibers. Other common tests include different type of blood work, and rarely, spinal fluid testing, and sometimes we do nerve and muscle biopsy.  We will start as needed Neurontin (gabapentin) 100 mg strength: Take 1 pill up to 3 times a day as needed. The most common side effects reported are sedation or sleepiness. Rare side effects include balance problems, confusion.

## 2016-07-13 DIAGNOSIS — H5200 Hypermetropia, unspecified eye: Secondary | ICD-10-CM | POA: Diagnosis not present

## 2016-07-13 DIAGNOSIS — Z0100 Encounter for examination of eyes and vision without abnormal findings: Secondary | ICD-10-CM | POA: Diagnosis not present

## 2016-08-13 ENCOUNTER — Encounter (INDEPENDENT_AMBULATORY_CARE_PROVIDER_SITE_OTHER): Payer: Self-pay | Admitting: Diagnostic Neuroimaging

## 2016-08-13 ENCOUNTER — Ambulatory Visit (INDEPENDENT_AMBULATORY_CARE_PROVIDER_SITE_OTHER): Payer: Medicare HMO | Admitting: Diagnostic Neuroimaging

## 2016-08-13 DIAGNOSIS — G629 Polyneuropathy, unspecified: Secondary | ICD-10-CM | POA: Diagnosis not present

## 2016-08-13 DIAGNOSIS — Z0289 Encounter for other administrative examinations: Secondary | ICD-10-CM

## 2016-08-13 DIAGNOSIS — M79605 Pain in left leg: Secondary | ICD-10-CM

## 2016-08-13 DIAGNOSIS — M79604 Pain in right leg: Secondary | ICD-10-CM

## 2016-08-13 NOTE — Procedures (Signed)
   [facility]  NCS (NERVE CONDUCTION STUDY) WITH EMG (ELECTROMYOGRAPHY) REPORT   STUDY DATE: 08/13/16 PATIENT NAME: Sherry Waller DOB: December 20, 1935 MRN: [id number]  ORDERING CLINICIAN: Star Age, MD PhD   TECHNOLOGIST: Laretta Alstrom  ELECTROMYOGRAPHER: Earlean Polka. Isak Sotomayor, MD  CLINICAL INFORMATION: 80 year old female with bilateral foot numbness and pain for past 5 years. Patient denies low back pain or radicular symptoms.   FINDINGS: NERVE CONDUCTION STUDY: Bilateral peroneal and tibial motor responses and F wave latencies are normal. Bilateral H reflex responses are normal. Bilateral peroneal sensory responses are normal.   NEEDLE ELECTROMYOGRAPHY: Needle examination of right lower extremity vastus medialis, tibialis anterior, gastrocnemius muscles is normal.   IMPRESSION:  This is a normal study. No electrodiagnostic evidence of large fiber neuropathy at this time.    INTERPRETING PHYSICIAN:  Penni Bombard, MD Certified in Neurology, Neurophysiology and Neuroimaging  [facility] [address], [place], [place] [address] [phone]

## 2016-08-13 NOTE — Progress Notes (Signed)
Please call and advise the patient that the recent EMG and nerve conduction velocity test, which is the electrical nerve and muscle test we we performed, was reported as within normal limits. We checked for abnormal electrical discharges in the muscles or nerves and the report suggested normal findings. No further action is required on this test at this time. Please remind patient to keep any upcoming appointments or tests and to call us with any interim questions, concerns, problems or updates. Thanks,  Klaire Court, MD, PhD 

## 2016-08-17 ENCOUNTER — Telehealth: Payer: Self-pay

## 2016-08-17 NOTE — Telephone Encounter (Signed)
I spoke to patient and she is aware of results and recommendations.  

## 2016-08-17 NOTE — Telephone Encounter (Signed)
-----   Message from Star Age, MD sent at 08/13/2016  6:11 PM EDT ----- Please call and advise the patient that the recent EMG and nerve conduction velocity test, which is the electrical nerve and muscle test we we performed, was reported as within normal limits. We checked for abnormal electrical discharges in the muscles or nerves and the report suggested normal findings. No further action is required on this test at this time. Please remind patient to keep any upcoming appointments or tests and to call us with any interim questions, concerns, problems or updates. Thanks,  Star Age, MD, PhD

## 2016-08-26 ENCOUNTER — Encounter: Payer: Medicare HMO | Admitting: Neurology

## 2016-10-15 DIAGNOSIS — D3709 Neoplasm of uncertain behavior of other specified sites of the oral cavity: Secondary | ICD-10-CM | POA: Diagnosis not present

## 2016-10-22 ENCOUNTER — Ambulatory Visit: Payer: Self-pay | Admitting: Adult Health

## 2016-10-22 ENCOUNTER — Telehealth: Payer: Self-pay

## 2016-10-22 ENCOUNTER — Ambulatory Visit: Payer: Medicare HMO | Admitting: Neurology

## 2016-10-22 DIAGNOSIS — K091 Developmental (nonodontogenic) cysts of oral region: Secondary | ICD-10-CM | POA: Diagnosis not present

## 2016-10-22 NOTE — Telephone Encounter (Signed)
I called pt, spoke to pt's husband, Marshell Levan, per DPR. Dr. Rexene Alberts is not feeling well and will not be here for pt's 1:30pm appt. I offered pt an appt with the NP at 2:30pm, which pt's husband accepted. Pt's husband verbalized understanding to arrive at 2:15pm for a 2:30pm appt.

## 2017-05-13 ENCOUNTER — Other Ambulatory Visit (HOSPITAL_COMMUNITY): Payer: Self-pay | Admitting: Family Medicine

## 2017-05-13 ENCOUNTER — Ambulatory Visit (HOSPITAL_COMMUNITY)
Admission: RE | Admit: 2017-05-13 | Discharge: 2017-05-13 | Disposition: A | Discharge status: Home / Self Care | Payer: Medicare HMO | Source: Ambulatory Visit | Attending: Family Medicine | Admitting: Family Medicine

## 2017-05-13 DIAGNOSIS — E663 Overweight: Secondary | ICD-10-CM | POA: Diagnosis not present

## 2017-05-13 DIAGNOSIS — I10 Essential (primary) hypertension: Secondary | ICD-10-CM | POA: Diagnosis not present

## 2017-05-13 DIAGNOSIS — Z0001 Encounter for general adult medical examination with abnormal findings: Secondary | ICD-10-CM | POA: Diagnosis not present

## 2017-05-13 DIAGNOSIS — Z6826 Body mass index (BMI) 26.0-26.9, adult: Secondary | ICD-10-CM | POA: Diagnosis not present

## 2017-05-13 DIAGNOSIS — N183 Chronic kidney disease, stage 3 (moderate): Secondary | ICD-10-CM | POA: Diagnosis not present

## 2017-05-13 DIAGNOSIS — E039 Hypothyroidism, unspecified: Secondary | ICD-10-CM | POA: Diagnosis not present

## 2017-05-13 DIAGNOSIS — Z1231 Encounter for screening mammogram for malignant neoplasm of breast: Secondary | ICD-10-CM | POA: Insufficient documentation

## 2017-05-13 DIAGNOSIS — E782 Mixed hyperlipidemia: Secondary | ICD-10-CM | POA: Diagnosis not present

## 2017-05-13 DIAGNOSIS — Z1389 Encounter for screening for other disorder: Secondary | ICD-10-CM | POA: Diagnosis not present

## 2017-05-13 DIAGNOSIS — G64 Other disorders of peripheral nervous system: Secondary | ICD-10-CM | POA: Diagnosis not present

## 2017-05-13 DIAGNOSIS — R7309 Other abnormal glucose: Secondary | ICD-10-CM | POA: Diagnosis not present

## 2017-07-21 DIAGNOSIS — M109 Gout, unspecified: Secondary | ICD-10-CM | POA: Diagnosis not present

## 2017-07-21 DIAGNOSIS — M5412 Radiculopathy, cervical region: Secondary | ICD-10-CM | POA: Diagnosis not present

## 2017-07-21 DIAGNOSIS — M1000 Idiopathic gout, unspecified site: Secondary | ICD-10-CM | POA: Diagnosis not present

## 2017-07-28 DIAGNOSIS — M9903 Segmental and somatic dysfunction of lumbar region: Secondary | ICD-10-CM | POA: Diagnosis not present

## 2017-07-28 DIAGNOSIS — M9902 Segmental and somatic dysfunction of thoracic region: Secondary | ICD-10-CM | POA: Diagnosis not present

## 2017-07-28 DIAGNOSIS — M546 Pain in thoracic spine: Secondary | ICD-10-CM | POA: Diagnosis not present

## 2017-07-28 DIAGNOSIS — I10 Essential (primary) hypertension: Secondary | ICD-10-CM | POA: Diagnosis not present

## 2017-07-28 DIAGNOSIS — M5412 Radiculopathy, cervical region: Secondary | ICD-10-CM | POA: Diagnosis not present

## 2017-07-28 DIAGNOSIS — M1000 Idiopathic gout, unspecified site: Secondary | ICD-10-CM | POA: Diagnosis not present

## 2017-07-28 DIAGNOSIS — M9901 Segmental and somatic dysfunction of cervical region: Secondary | ICD-10-CM | POA: Diagnosis not present

## 2017-07-28 DIAGNOSIS — M5442 Lumbago with sciatica, left side: Secondary | ICD-10-CM | POA: Diagnosis not present

## 2017-07-28 DIAGNOSIS — M9905 Segmental and somatic dysfunction of pelvic region: Secondary | ICD-10-CM | POA: Diagnosis not present

## 2017-07-28 DIAGNOSIS — M9906 Segmental and somatic dysfunction of lower extremity: Secondary | ICD-10-CM | POA: Diagnosis not present

## 2017-08-02 DIAGNOSIS — M9903 Segmental and somatic dysfunction of lumbar region: Secondary | ICD-10-CM | POA: Diagnosis not present

## 2017-08-02 DIAGNOSIS — M5442 Lumbago with sciatica, left side: Secondary | ICD-10-CM | POA: Diagnosis not present

## 2017-08-02 DIAGNOSIS — M9905 Segmental and somatic dysfunction of pelvic region: Secondary | ICD-10-CM | POA: Diagnosis not present

## 2017-08-02 DIAGNOSIS — M9906 Segmental and somatic dysfunction of lower extremity: Secondary | ICD-10-CM | POA: Diagnosis not present

## 2017-08-02 DIAGNOSIS — M9902 Segmental and somatic dysfunction of thoracic region: Secondary | ICD-10-CM | POA: Diagnosis not present

## 2017-08-02 DIAGNOSIS — M546 Pain in thoracic spine: Secondary | ICD-10-CM | POA: Diagnosis not present

## 2017-08-12 DIAGNOSIS — M546 Pain in thoracic spine: Secondary | ICD-10-CM | POA: Diagnosis not present

## 2017-08-12 DIAGNOSIS — M9901 Segmental and somatic dysfunction of cervical region: Secondary | ICD-10-CM | POA: Diagnosis not present

## 2017-08-12 DIAGNOSIS — M9902 Segmental and somatic dysfunction of thoracic region: Secondary | ICD-10-CM | POA: Diagnosis not present

## 2017-08-12 DIAGNOSIS — M9903 Segmental and somatic dysfunction of lumbar region: Secondary | ICD-10-CM | POA: Diagnosis not present

## 2017-08-12 DIAGNOSIS — M9905 Segmental and somatic dysfunction of pelvic region: Secondary | ICD-10-CM | POA: Diagnosis not present

## 2017-08-12 DIAGNOSIS — M9906 Segmental and somatic dysfunction of lower extremity: Secondary | ICD-10-CM | POA: Diagnosis not present

## 2017-08-12 DIAGNOSIS — M5442 Lumbago with sciatica, left side: Secondary | ICD-10-CM | POA: Diagnosis not present

## 2017-08-12 DIAGNOSIS — M5412 Radiculopathy, cervical region: Secondary | ICD-10-CM | POA: Diagnosis not present

## 2017-11-16 DIAGNOSIS — Z6826 Body mass index (BMI) 26.0-26.9, adult: Secondary | ICD-10-CM | POA: Diagnosis not present

## 2017-11-16 DIAGNOSIS — E782 Mixed hyperlipidemia: Secondary | ICD-10-CM | POA: Diagnosis not present

## 2017-11-16 DIAGNOSIS — Z1389 Encounter for screening for other disorder: Secondary | ICD-10-CM | POA: Diagnosis not present

## 2017-11-16 DIAGNOSIS — G609 Hereditary and idiopathic neuropathy, unspecified: Secondary | ICD-10-CM | POA: Diagnosis not present

## 2017-11-16 DIAGNOSIS — E063 Autoimmune thyroiditis: Secondary | ICD-10-CM | POA: Diagnosis not present

## 2017-11-16 DIAGNOSIS — E663 Overweight: Secondary | ICD-10-CM | POA: Diagnosis not present

## 2017-11-16 DIAGNOSIS — M1991 Primary osteoarthritis, unspecified site: Secondary | ICD-10-CM | POA: Diagnosis not present

## 2017-11-16 DIAGNOSIS — M2550 Pain in unspecified joint: Secondary | ICD-10-CM | POA: Diagnosis not present

## 2017-11-16 DIAGNOSIS — N183 Chronic kidney disease, stage 3 (moderate): Secondary | ICD-10-CM | POA: Diagnosis not present

## 2018-05-09 DIAGNOSIS — E663 Overweight: Secondary | ICD-10-CM | POA: Diagnosis not present

## 2018-05-09 DIAGNOSIS — I10 Essential (primary) hypertension: Secondary | ICD-10-CM | POA: Diagnosis not present

## 2018-05-09 DIAGNOSIS — Z1389 Encounter for screening for other disorder: Secondary | ICD-10-CM | POA: Diagnosis not present

## 2018-05-09 DIAGNOSIS — Z6826 Body mass index (BMI) 26.0-26.9, adult: Secondary | ICD-10-CM | POA: Diagnosis not present

## 2018-05-16 DIAGNOSIS — Z6826 Body mass index (BMI) 26.0-26.9, adult: Secondary | ICD-10-CM | POA: Diagnosis not present

## 2018-05-16 DIAGNOSIS — E663 Overweight: Secondary | ICD-10-CM | POA: Diagnosis not present

## 2018-05-16 DIAGNOSIS — R7309 Other abnormal glucose: Secondary | ICD-10-CM | POA: Diagnosis not present

## 2018-05-16 DIAGNOSIS — G64 Other disorders of peripheral nervous system: Secondary | ICD-10-CM | POA: Diagnosis not present

## 2018-05-16 DIAGNOSIS — Z0001 Encounter for general adult medical examination with abnormal findings: Secondary | ICD-10-CM | POA: Diagnosis not present

## 2018-05-16 DIAGNOSIS — Z1389 Encounter for screening for other disorder: Secondary | ICD-10-CM | POA: Diagnosis not present

## 2018-05-16 DIAGNOSIS — I10 Essential (primary) hypertension: Secondary | ICD-10-CM | POA: Diagnosis not present

## 2018-05-16 DIAGNOSIS — E782 Mixed hyperlipidemia: Secondary | ICD-10-CM | POA: Diagnosis not present

## 2018-05-16 DIAGNOSIS — E039 Hypothyroidism, unspecified: Secondary | ICD-10-CM | POA: Diagnosis not present

## 2018-05-16 DIAGNOSIS — Z13220 Encounter for screening for lipoid disorders: Secondary | ICD-10-CM | POA: Diagnosis not present

## 2018-05-18 ENCOUNTER — Other Ambulatory Visit (HOSPITAL_COMMUNITY): Payer: Self-pay | Admitting: Family Medicine

## 2018-05-18 DIAGNOSIS — Z1231 Encounter for screening mammogram for malignant neoplasm of breast: Secondary | ICD-10-CM

## 2018-05-23 ENCOUNTER — Emergency Department (HOSPITAL_COMMUNITY)
Admission: EM | Admit: 2018-05-23 | Discharge: 2018-05-23 | Disposition: A | Discharge status: Home / Self Care | Payer: Medicare HMO | Attending: Emergency Medicine | Admitting: Emergency Medicine

## 2018-05-23 ENCOUNTER — Encounter (HOSPITAL_COMMUNITY): Payer: Self-pay | Admitting: Emergency Medicine

## 2018-05-23 DIAGNOSIS — Z79899 Other long term (current) drug therapy: Secondary | ICD-10-CM | POA: Insufficient documentation

## 2018-05-23 DIAGNOSIS — R030 Elevated blood-pressure reading, without diagnosis of hypertension: Secondary | ICD-10-CM | POA: Diagnosis present

## 2018-05-23 DIAGNOSIS — F419 Anxiety disorder, unspecified: Secondary | ICD-10-CM | POA: Insufficient documentation

## 2018-05-23 DIAGNOSIS — I10 Essential (primary) hypertension: Secondary | ICD-10-CM

## 2018-05-23 DIAGNOSIS — R69 Illness, unspecified: Secondary | ICD-10-CM | POA: Diagnosis not present

## 2018-05-23 LAB — BASIC METABOLIC PANEL
Anion gap: 11 (ref 5–15)
BUN: 12 mg/dL (ref 8–23)
CO2: 24 mmol/L (ref 22–32)
Calcium: 9.8 mg/dL (ref 8.9–10.3)
Chloride: 101 mmol/L (ref 98–111)
Creatinine, Ser: 1.15 mg/dL — ABNORMAL HIGH (ref 0.44–1.00)
GFR calc Af Amer: 51 mL/min — ABNORMAL LOW (ref >60)
GFR calc non Af Amer: 44 mL/min — ABNORMAL LOW (ref >60)
Glucose, Bld: 126 mg/dL — ABNORMAL HIGH (ref 70–99)
Potassium: 4.3 mmol/L (ref 3.5–5.1)
Sodium: 136 mmol/L (ref 135–145)

## 2018-05-23 LAB — CBC WITH DIFFERENTIAL/PLATELET
Abs Immature Granulocytes: 0 10*3/uL (ref 0.0–0.1)
Basophils Absolute: 0.1 10*3/uL (ref 0.0–0.1)
Basophils Relative: 1 %
Eosinophils Absolute: 0.2 10*3/uL (ref 0.0–0.7)
Eosinophils Relative: 4 %
HCT: 43 % (ref 36.0–46.0)
Hemoglobin: 13.9 g/dL (ref 12.0–15.0)
Immature Granulocytes: 0 %
Lymphocytes Relative: 22 %
Lymphs Abs: 1.2 10*3/uL (ref 0.7–4.0)
MCH: 29.1 pg (ref 26.0–34.0)
MCHC: 32.3 g/dL (ref 30.0–36.0)
MCV: 90.1 fL (ref 78.0–100.0)
Monocytes Absolute: 0.6 10*3/uL (ref 0.1–1.0)
Monocytes Relative: 10 %
Neutro Abs: 3.4 10*3/uL (ref 1.7–7.7)
Neutrophils Relative %: 63 %
Platelets: 214 10*3/uL (ref 150–400)
RBC: 4.77 MIL/uL (ref 3.87–5.11)
RDW: 12.6 % (ref 11.5–15.5)
WBC: 5.4 10*3/uL (ref 4.0–10.5)

## 2018-05-23 LAB — URINALYSIS, ROUTINE W REFLEX MICROSCOPIC
Bilirubin Urine: NEGATIVE
Glucose, UA: NEGATIVE mg/dL
Hgb urine dipstick: NEGATIVE
Ketones, ur: NEGATIVE mg/dL
Leukocytes, UA: NEGATIVE
Nitrite: NEGATIVE
Protein, ur: NEGATIVE mg/dL
Specific Gravity, Urine: 1.004 — ABNORMAL LOW (ref 1.005–1.030)
pH: 7 (ref 5.0–8.0)

## 2018-05-23 LAB — TSH: TSH: 2.183 u[IU]/mL (ref 0.350–4.500)

## 2018-05-23 MED ORDER — LORAZEPAM 0.5 MG PO TABS: 1.0000 mg | ORAL_TABLET | Freq: Three times a day (TID) | ORAL | 0 refills | Status: DC | PRN

## 2018-05-23 NOTE — ED Triage Notes (Signed)
Patient complains of hypertension x3 weeks. States she has gone to PCP recently and changed antihypertensive prescription but systolic still over 890. Patient denies headache, dizziness, or any other complaint. States she feels okay, but is concerned that her blood pressure continues to measure so high. Patient alert, oriented, and ambulating indecently with steady gait.

## 2018-05-23 NOTE — ED Provider Notes (Signed)
[facility]PART[facility] Provider Note   CSN: [id number] Arrival date & time: 05/23/18  7782     History   Chief Complaint Chief Complaint  Patient presents with  . Hypertension    HPI Sherry Waller is a 82 y.o. female.  HPI Patient presents to the emergency department with recent elevation of her blood pressure over the last 3 weeks.  The patient states that she saw her doctor twice who initially adjusted some medications and this did not seem to help to stop this medication started new medications for the patient.  The patient states that nothing seems to make the condition better or worse.  She states she has been under a lot of stress and has lots of anxiety.  Patient states that she did not take any other medications prior to arrival.  The patient denies chest pain, shortness of breath, headache,blurred vision, neck pain, fever, cough, weakness, numbness, dizziness, anorexia, edema, abdominal pain, nausea, vomiting, diarrhea, rash, back pain, dysuria, hematemesis, bloody stool, near syncope, or syncope. Past Medical History:  Diagnosis Date  . Hypertension   . Thyroid disease     There are no active problems to display for this patient.   Past Surgical History:  Procedure Laterality Date  . ABDOMINAL HYSTERECTOMY    . CHOLECYSTECTOMY    . TONSILLECTOMY       OB History   None      Home Medications    Prior to Admission medications   Medication Sig Start Date End Date Taking? Authorizing Provider  Alpha-Lipoic Acid 100 MG CAPS Take by mouth.    [provider]  amLODipine-olmesartan (AZOR) 10-40 MG per tablet Take 1 tablet by mouth daily.    [provider]  Fish Oil OIL by Does not apply route.    [provider]  gabapentin (NEURONTIN) 100 MG capsule 1 pill up to 3 times a day as needed for nerve pain. 07/09/16   Star Age, MD  ibuprofen (ADVIL,MOTRIN) 200 MG tablet Take 200 mg by mouth as needed.     [provider]  levothyroxine (SYNTHROID, LEVOTHROID) 50 MCG tablet Take 50 mcg by mouth daily before breakfast.    [provider]  metoprolol-hydrochlorothiazide (LOPRESSOR HCT) 100-25 MG per tablet Take 1 tablet by mouth daily.    [provider]  traMADol-acetaminophen Caroline Sauger) 37.5-325 MG tablet  06/12/16   [provider]    Family History Family History  Problem Relation Age of Onset  . Brain cancer Father   . Ovarian cancer Sister   . Brain cancer Sister     Social History Social History   Tobacco Use  . Smoking status: Never Smoker  . Smokeless tobacco: Never Used  Substance Use Topics  . Alcohol use: No  . Drug use: No     Allergies   Fenofibrate; Statins; and Sulfa antibiotics   Review of Systems Review of Systems All other systems negative except as documented in the HPI. All pertinent positives and negatives as reviewed in the HPI.  Physical Exam Updated Vital Signs BP (!) 157/66   Pulse (!) 58   Temp 98.2 F (36.8 C) (Oral)   Resp 14   SpO2 96%   Physical Exam  Constitutional: She is oriented to person, place, and time. She appears well-developed and well-nourished. No distress.  HENT:  Head: Normocephalic and atraumatic.  Mouth/Throat: Oropharynx is clear and moist.  Eyes: Pupils are equal, round, and reactive to light.  Neck: Normal range of motion. Neck supple.  Cardiovascular: Normal rate, regular rhythm and normal heart sounds. Exam reveals no gallop and no friction rub.  No murmur heard. Pulmonary/Chest: Effort normal and breath sounds normal. No respiratory distress. She has no wheezes.  Abdominal: Soft. Bowel sounds are normal. She exhibits no distension. There is no tenderness.  Neurological: She is alert and oriented to person, place, and time. She exhibits normal muscle tone. Coordination normal.  Skin: Skin is warm and dry. Capillary refill takes less than 2 seconds. No rash noted. No erythema.    Psychiatric: She has a normal mood and affect. Her behavior is normal.  Nursing note and vitals reviewed.    ED Treatments / Results  Labs (all labs ordered are listed, but only abnormal results are displayed) Labs Reviewed  BASIC METABOLIC PANEL - Abnormal; Notable for the following components:      Result Value   Glucose, Bld 126 (*)    Creatinine, Ser 1.15 (*)    GFR calc non Af Amer 44 (*)    GFR calc Af Amer 51 (*)    All other components within normal limits  URINALYSIS, ROUTINE W REFLEX MICROSCOPIC - Abnormal; Notable for the following components:   Color, Urine STRAW (*)    Specific Gravity, Urine 1.004 (*)    All other components within normal limits  CBC WITH DIFFERENTIAL/PLATELET  TSH    EKG EKG Interpretation  Date/Time:  Monday May 23 2018 08:32:53 EDT Ventricular Rate:  57 PR Interval:    QRS Duration: 87 QT Interval:  448 QTC Calculation: 437 R Axis:   0 Text Interpretation:  Sinus rhythm Prolonged PR interval Anterior infarct, old similar to prior 7/02 Confirmed by Aletta Edouard [phone]) on 05/23/2018 9:03:17 AM   Radiology No results found.  Procedures Procedures (including critical care time)  Medications Ordered in ED Medications - No data to display   Initial Impression / Assessment and Plan / ED Course  I have reviewed the triage vital signs and the nursing notes.  Pertinent labs & imaging results that were available during my care of the patient were reviewed by me and considered in my medical decision making (see chart for details).  Clinical Course as of May 23 1106  Mon May 24, 4367  3622 82 year old female here with elevated blood pressure over the past 3 or so weeks.  She seen her PCP for it had her med adjusted and then changed and she still is having elevated blood pressures.  She says is causing some pressure in the back of her neck but no significant symptoms.  She attributes this to her stress level being high with a lot of  family issues.  Her work appears been unremarkable and ultimately I think we refer her back to her PCP for continued management of this.  Consider starting on a little bit of anxiety medicine.   [MB]    Clinical Course User Index [MB] Hayden Rasmussen, MD    Patient has no signs of hypertensive emergency or crisis.  Patient will be referred back to her primary doctor.  She did request something for anxiety she has been under a lot of stress over the last 2 weeks due to some family issues.  Patient is advised to follow-up with her doctor and return here as needed.  Patient agrees the plan and all questions were answered.  Final Clinical Impressions(s) / ED Diagnoses   Final diagnoses:  None    ED  Discharge Orders    None       Dalia Heading, Hershal Coria 05/23/18 1630    Hayden Rasmussen, MD 05/23/18 [phone]

## 2018-05-23 NOTE — Discharge Instructions (Signed)
Return here as needed. Follow up with your doctor as soon as possible.

## 2018-06-16 DIAGNOSIS — I10 Essential (primary) hypertension: Secondary | ICD-10-CM | POA: Diagnosis not present

## 2018-06-16 DIAGNOSIS — Z1389 Encounter for screening for other disorder: Secondary | ICD-10-CM | POA: Diagnosis not present

## 2018-06-16 DIAGNOSIS — E663 Overweight: Secondary | ICD-10-CM | POA: Diagnosis not present

## 2018-06-16 DIAGNOSIS — Z6826 Body mass index (BMI) 26.0-26.9, adult: Secondary | ICD-10-CM | POA: Diagnosis not present

## 2018-07-08 DIAGNOSIS — R69 Illness, unspecified: Secondary | ICD-10-CM | POA: Diagnosis not present

## 2018-08-08 ENCOUNTER — Other Ambulatory Visit: Payer: Self-pay | Admitting: Neurology

## 2018-08-08 DIAGNOSIS — G629 Polyneuropathy, unspecified: Secondary | ICD-10-CM

## 2018-08-08 DIAGNOSIS — M79604 Pain in right leg: Secondary | ICD-10-CM

## 2018-08-08 DIAGNOSIS — M79605 Pain in left leg: Secondary | ICD-10-CM

## 2018-08-19 DIAGNOSIS — M9905 Segmental and somatic dysfunction of pelvic region: Secondary | ICD-10-CM | POA: Diagnosis not present

## 2018-08-19 DIAGNOSIS — M546 Pain in thoracic spine: Secondary | ICD-10-CM | POA: Diagnosis not present

## 2018-08-19 DIAGNOSIS — M9903 Segmental and somatic dysfunction of lumbar region: Secondary | ICD-10-CM | POA: Diagnosis not present

## 2018-08-19 DIAGNOSIS — M9906 Segmental and somatic dysfunction of lower extremity: Secondary | ICD-10-CM | POA: Diagnosis not present

## 2018-08-19 DIAGNOSIS — M9902 Segmental and somatic dysfunction of thoracic region: Secondary | ICD-10-CM | POA: Diagnosis not present

## 2018-08-19 DIAGNOSIS — M5442 Lumbago with sciatica, left side: Secondary | ICD-10-CM | POA: Diagnosis not present

## 2018-08-19 DIAGNOSIS — I10 Essential (primary) hypertension: Secondary | ICD-10-CM | POA: Diagnosis not present

## 2018-08-22 DIAGNOSIS — M9905 Segmental and somatic dysfunction of pelvic region: Secondary | ICD-10-CM | POA: Diagnosis not present

## 2018-08-22 DIAGNOSIS — M9903 Segmental and somatic dysfunction of lumbar region: Secondary | ICD-10-CM | POA: Diagnosis not present

## 2018-08-22 DIAGNOSIS — M5442 Lumbago with sciatica, left side: Secondary | ICD-10-CM | POA: Diagnosis not present

## 2018-08-22 DIAGNOSIS — M9902 Segmental and somatic dysfunction of thoracic region: Secondary | ICD-10-CM | POA: Diagnosis not present

## 2018-08-22 DIAGNOSIS — M546 Pain in thoracic spine: Secondary | ICD-10-CM | POA: Diagnosis not present

## 2018-08-22 DIAGNOSIS — M9906 Segmental and somatic dysfunction of lower extremity: Secondary | ICD-10-CM | POA: Diagnosis not present

## 2018-09-06 ENCOUNTER — Other Ambulatory Visit (HOSPITAL_COMMUNITY): Payer: Self-pay | Admitting: Family Medicine

## 2018-09-06 DIAGNOSIS — Z6826 Body mass index (BMI) 26.0-26.9, adult: Secondary | ICD-10-CM | POA: Diagnosis not present

## 2018-09-06 DIAGNOSIS — E663 Overweight: Secondary | ICD-10-CM | POA: Diagnosis not present

## 2018-09-06 DIAGNOSIS — I10 Essential (primary) hypertension: Secondary | ICD-10-CM | POA: Diagnosis not present

## 2018-09-20 ENCOUNTER — Ambulatory Visit (HOSPITAL_COMMUNITY)
Admission: RE | Admit: 2018-09-20 | Discharge: 2018-09-20 | Disposition: A | Discharge status: Home / Self Care | Payer: Medicare HMO | Source: Ambulatory Visit | Attending: Family Medicine | Admitting: Family Medicine

## 2018-09-20 ENCOUNTER — Encounter (HOSPITAL_COMMUNITY): Payer: Self-pay

## 2018-09-20 DIAGNOSIS — I10 Essential (primary) hypertension: Secondary | ICD-10-CM | POA: Insufficient documentation

## 2018-09-30 ENCOUNTER — Ambulatory Visit (HOSPITAL_COMMUNITY)
Admission: RE | Admit: 2018-09-30 | Discharge: 2018-09-30 | Disposition: A | Discharge status: Home / Self Care | Payer: Medicare HMO | Source: Ambulatory Visit | Attending: Family Medicine | Admitting: Family Medicine

## 2018-09-30 DIAGNOSIS — N1330 Unspecified hydronephrosis: Secondary | ICD-10-CM | POA: Insufficient documentation

## 2018-09-30 DIAGNOSIS — I10 Essential (primary) hypertension: Secondary | ICD-10-CM | POA: Insufficient documentation

## 2018-09-30 DIAGNOSIS — I701 Atherosclerosis of renal artery: Secondary | ICD-10-CM | POA: Diagnosis not present

## 2018-10-07 DIAGNOSIS — I739 Peripheral vascular disease, unspecified: Secondary | ICD-10-CM | POA: Diagnosis not present

## 2018-10-07 DIAGNOSIS — E663 Overweight: Secondary | ICD-10-CM | POA: Diagnosis not present

## 2018-10-07 DIAGNOSIS — I701 Atherosclerosis of renal artery: Secondary | ICD-10-CM | POA: Diagnosis not present

## 2018-10-07 DIAGNOSIS — Z1389 Encounter for screening for other disorder: Secondary | ICD-10-CM | POA: Diagnosis not present

## 2018-10-07 DIAGNOSIS — Z6827 Body mass index (BMI) 27.0-27.9, adult: Secondary | ICD-10-CM | POA: Diagnosis not present

## 2018-10-10 ENCOUNTER — Other Ambulatory Visit (HOSPITAL_COMMUNITY): Payer: Self-pay | Admitting: Family Medicine

## 2018-10-10 DIAGNOSIS — I739 Peripheral vascular disease, unspecified: Secondary | ICD-10-CM

## 2018-10-10 DIAGNOSIS — I701 Atherosclerosis of renal artery: Secondary | ICD-10-CM

## 2018-10-17 ENCOUNTER — Ambulatory Visit (HOSPITAL_COMMUNITY)
Admission: RE | Admit: 2018-10-17 | Discharge: 2018-10-17 | Disposition: A | Discharge status: Home / Self Care | Payer: Medicare HMO | Source: Ambulatory Visit | Attending: Family Medicine | Admitting: Family Medicine

## 2018-10-17 DIAGNOSIS — R200 Anesthesia of skin: Secondary | ICD-10-CM | POA: Diagnosis not present

## 2018-10-17 DIAGNOSIS — I739 Peripheral vascular disease, unspecified: Secondary | ICD-10-CM | POA: Insufficient documentation

## 2018-10-17 DIAGNOSIS — I701 Atherosclerosis of renal artery: Secondary | ICD-10-CM | POA: Insufficient documentation

## 2018-10-24 ENCOUNTER — Encounter: Payer: Self-pay | Admitting: Vascular Surgery

## 2018-10-24 ENCOUNTER — Ambulatory Visit: Payer: Medicare HMO | Admitting: Vascular Surgery

## 2018-10-24 VITALS — BP 175/59 | HR 50 | Temp 97.1°F | Resp 18 | Ht 65.0 in | Wt 154.2 lb

## 2018-10-24 DIAGNOSIS — I739 Peripheral vascular disease, unspecified: Secondary | ICD-10-CM

## 2018-10-24 DIAGNOSIS — I10 Essential (primary) hypertension: Secondary | ICD-10-CM

## 2018-10-24 NOTE — Progress Notes (Signed)
Vascular and Vein Specialist of [facility]  Patient name: Sherry Waller MRN: [id number] DOB: June 05, 1936 Sex: female   Seen today in our [place] office  REASON FOR CONSULT: Discuss ultrasound results of mesenteric and renal artery stenosis.  Also evaluation of lower extremity symptoms to rule out peripheral vascular disease  HPI: Sherry Waller is a 83 y.o. female, who is here today for discussion of recent duplex.  She has had increasingly difficult to control hypertension.  She underwent noninvasive ultrasound suggesting celiac superior mesenteric artery occlusive disease.  Also left renal artery occlusion.  She specifically denies any symptoms of mesenteric ischemia.  No past postprandial pain or weight loss.  Also is concern regarding potential peripheral vascular disease for her lower extremity symptoms of tingling and numbness.  She has no history of tissue loss.  Numbness and tingling and heavy sensation in both feet.  Also numbness sensation across her buttocks and groin area.  This is not specifically related to exercise.  Has tried gabapentin with some relief.  Past Medical History:  Diagnosis Date  . Hypertension   . Thyroid disease     Family History  Problem Relation Age of Onset  . Brain cancer Father   . Ovarian cancer Sister   . Brain cancer Sister     SOCIAL HISTORY: Social History   Socioeconomic History  . Marital status: Married    Spouse name: Not on file  . Number of children: 3  . Years of education: 12+  . Highest education level: Not on file  Occupational History  . Not on file  Social Needs  . Financial resource strain: Not on file  . Food insecurity:    Worry: Not on file    Inability: Not on file  . Transportation needs:    Medical: Not on file    Non-medical: Not on file  Tobacco Use  . Smoking status: Never Smoker  . Smokeless tobacco: Never Used  Substance and Sexual Activity  . Alcohol use: No   . Drug use: No  . Sexual activity: Yes    Birth control/protection: Surgical  Lifestyle  . Physical activity:    Days per week: Not on file    Minutes per session: Not on file  . Stress: Not on file  Relationships  . Social connections:    Talks on phone: Not on file    Gets together: Not on file    Attends religious service: Not on file    Active member of club or organization: Not on file    Attends meetings of clubs or organizations: Not on file    Relationship status: Not on file  . Intimate partner violence:    Fear of current or ex partner: Not on file    Emotionally abused: Not on file    Physically abused: Not on file    Forced sexual activity: Not on file  Other Topics Concern  . Not on file  Social History Narrative   Drinks 1 cup of coffee a day     Allergies  Allergen Reactions  . Fenofibrate Other (See Comments)    Aches  . Statins Itching  . Sulfa Antibiotics Other (See Comments)    Current Outpatient Medications  Medication Sig Dispense Refill  . Alpha-Lipoic Acid 100 MG CAPS Take by mouth.    Marland Kitchen amLODipine-olmesartan (AZOR) 10-40 MG per tablet Take 1 tablet by mouth daily.    . cloNIDine (CATAPRES - DOSED IN MG/24 HR) 0.1  mg/24hr patch Place 0.1 mg onto the skin once a week.    . diltiazem (TIAZAC) 360 MG 24 hr capsule Take 360 mg by mouth daily.    Marland Kitchen escitalopram (LEXAPRO) 10 MG tablet Take 10 mg by mouth daily. Take 1/2 a tablet by mouth once a day    . Fish Oil OIL by Does not apply route.    Marland Kitchen ibuprofen (ADVIL,MOTRIN) 200 MG tablet Take 200 mg by mouth as needed.    Marland Kitchen levothyroxine (SYNTHROID, LEVOTHROID) 50 MCG tablet Take 50 mcg by mouth daily before breakfast.    . LORazepam (ATIVAN) 0.5 MG tablet Take 2 tablets (1 mg total) by mouth every 8 (eight) hours as needed for anxiety. 12 tablet 0  . metoprolol-hydrochlorothiazide (LOPRESSOR HCT) 100-25 MG per tablet Take 1 tablet by mouth daily.     No current facility-administered medications for this  visit.     REVIEW OF SYSTEMS:  [X]  denotes positive finding, [ ]  denotes negative finding Cardiac  Comments:  Chest pain or chest pressure:    Shortness of breath upon exertion:    Short of breath when lying flat:    Irregular heart rhythm:        Vascular    Pain in calf, thigh, or hip brought on by ambulation:    Pain in feet at night that wakes you up from your sleep:     Blood clot in your veins:    Leg swelling:         Pulmonary    Oxygen at home:    Productive cough:     Wheezing:         Neurologic    Sudden weakness in arms or legs:     Sudden numbness in arms or legs:  x   Sudden onset of difficulty speaking or slurred speech:    Temporary loss of vision in one eye:     Problems with dizziness:         Gastrointestinal    Blood in stool:     Vomited blood:         Genitourinary    Burning when urinating:     Blood in urine:        Psychiatric    Major depression:         Hematologic    Bleeding problems:    Problems with blood clotting too easily:        Skin    Rashes or ulcers:        Constitutional    Fever or chills:      PHYSICAL EXAM: Vitals:   10/24/18 1131  BP: (!) 175/59  Pulse: (!) 50  Resp: 18  Temp: (!) 97.1 F (36.2 C)  TempSrc: Temporal  Weight: 154 lb 3.2 oz (69.9 kg)  Height: 5\' 5"  (1.651 m)    GENERAL: The patient is a well-nourished female, in no acute distress. The vital signs are documented above. CARDIOVASCULAR: Carotid arteries without bruits bilaterally.  2+ radial and 2+ dorsalis pedis pulses bilaterally. PULMONARY: There is good air exchange  ABDOMEN: Soft and non-tender.  No abdominal bruit noted MUSCULOSKELETAL: There are no major deformities or cyanosis. NEUROLOGIC: No focal weakness or paresthesias are detected. SKIN: There are no ulcers or rashes noted. PSYCHIATRIC: The patient has a normal affect.  DATA:  I reviewed her duplex on 10/01/2018.  This shows probable occlusion of her left renal artery with a  sclerotic nonfunctional left kidney.  She does have  elevated velocities in her superior mesenteric and celiac artery suggesting stenosis.  Her right renal artery does not have markedly elevated velocities  Review a CT scan from 2011 in our system.  The images do show stenosis of her celiac and superior mesenteric artery at that time.  Did have equal kidney size in 2011  Discussed noninvasive studies from 10/17/2018 lower extremities.  This shows essentially normal ankle arm index bilaterally at 0.91.  She has normal waveforms.  MEDICAL ISSUES: Long discussion with the patient regarding these multiple different areas of narrowing.  She is asymptomatic from her celiac and Spear mesenteric artery stenosis.  I did discuss symptoms of mesenteric ischemia and she knows to report immediately should this occur.  I explained that her left kidney is nonfunctional and she has no significant stenosis in her right renal artery and therefore would not recommend any further evaluation or treatment  Lower extremity symptoms, I explained that she has essentially normal flow and would not have arterial insufficiency as a cause of this.  She was reassured with this discussion will see [place] again on an as needed basis   Rosetta Posner, MD [facility] Vascular and Vein Specialists of [facility] Tel [phone] Pager [phone]

## 2018-10-25 ENCOUNTER — Encounter: Payer: Self-pay | Admitting: Cardiovascular Disease

## 2018-10-25 ENCOUNTER — Ambulatory Visit: Payer: Medicare HMO | Admitting: Cardiovascular Disease

## 2018-10-25 VITALS — BP 150/60 | HR 48 | Ht 65.0 in | Wt 153.0 lb

## 2018-10-25 DIAGNOSIS — I10 Essential (primary) hypertension: Secondary | ICD-10-CM | POA: Diagnosis not present

## 2018-10-25 DIAGNOSIS — N183 Chronic kidney disease, stage 3 unspecified: Secondary | ICD-10-CM

## 2018-10-25 DIAGNOSIS — I739 Peripheral vascular disease, unspecified: Secondary | ICD-10-CM | POA: Diagnosis not present

## 2018-10-25 MED ORDER — HYDRALAZINE HCL 25 MG PO TABS: 25.0000 mg | ORAL_TABLET | Freq: Three times a day (TID) | ORAL | 3 refills | Status: DC

## 2018-10-25 NOTE — Progress Notes (Signed)
CARDIOLOGY CONSULT NOTE  Patient ID: Sherry Waller MRN: [id number] DOB/AGE: [birth year] 83 y.o.  Admit date: (Not on file) Primary Physician: Sherry Sites, MD Referring Physician: Sharilyn Sites, MD  Reason for Consultation: Accelerated hypertension  HPI: Sherry Waller is a 83 y.o. female who is being seen today for the evaluation of accelerated hypertension at the request of Sherry Sites, MD.   She was evaluated in the ED for hypertension on 05/23/2018 when blood pressure was 157/66.  I reviewed PCP notes which indicate blood pressure was 210/80 at office visit on 09/06/2018.  She was started on Catapres patch 0.1 mg daily which has been increased to 0.2 mg daily since that time.  She saw vascular surgery as an outpatient yesterday and blood pressure was 175/59.  I reviewed labs dated 05/23/2018 which showed BUN 12 and creatinine 1.15.  CBC and TSH were normal.  I reviewed renal artery Dopplers performed on 10/01/2018 which showed an occluded left renal artery with 1 to 59% stenosis of the right renal artery.  There was 79 9% stenosis in the celiac artery and superior mesenteric artery.  I personally reviewed the ECG performed on 05/23/2018 which showed sinus bradycardia with late R wave transition.  The patient denies any symptoms of chest pain, palpitations, shortness of breath, lightheadedness, dizziness, leg swelling, orthopnea, PND, and syncope.  Her primary complaints relate to bilateral tingling and numbness of both legs and feet primarily from the calves down.  She has had numbness of the buttock and bilateral groin region as well.  She denies headaches and visual disturbances.  Social history: Married with 3 adult sons, all of whom are Customer service manager.  Her husband has a history of CABG.  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  Family history: Mother died at age 63.  Father died in his 76s of a brain tumor.  Her sister died of  a brain tumor as well.  A brother died of pulmonary embolism after knee surgery.   Allergies  Allergen Reactions  . Fenofibrate Other (See Comments)    Aches  . Statins Itching  . Sulfa Antibiotics Other (See Comments)    Current Outpatient Medications  Medication Sig Dispense Refill  . Alpha-Lipoic Acid 100 MG CAPS Take by mouth.    . cloNIDine (CATAPRES - DOSED IN MG/24 HR) 0.2 mg/24hr patch Place 0.2 mg onto the skin once a week.    . diltiazem (TIAZAC) 360 MG 24 hr capsule Take 360 mg by mouth daily.    Marland Kitchen escitalopram (LEXAPRO) 10 MG tablet Take 10 mg by mouth daily. Take 1/2 a tablet by mouth once a day    . Fish Oil OIL by Does not apply route.    Marland Kitchen ibuprofen (ADVIL,MOTRIN) 200 MG tablet Take 200 mg by mouth as needed.    Marland Kitchen levothyroxine (SYNTHROID, LEVOTHROID) 50 MCG tablet Take 50 mcg by mouth daily before breakfast.    . metoprolol-hydrochlorothiazide (LOPRESSOR HCT) 100-25 MG per tablet Take 1 tablet by mouth daily. Takes 1/2 tablet BID    . olmesartan (BENICAR) 40 MG tablet Take 40 mg by mouth daily.     No current facility-administered medications for this visit.     Past Medical History:  Diagnosis Date  . Hypertension   . Thyroid disease     Past Surgical History:  Procedure Laterality Date  . ABDOMINAL HYSTERECTOMY    . CHOLECYSTECTOMY    .  TONSILLECTOMY      Social History   Socioeconomic History  . Marital status: Married    Spouse name: Not on file  . Number of children: 3  . Years of education: 12+  . Highest education level: Not on file  Occupational History  . Not on file  Social Needs  . Financial resource strain: Not on file  . Food insecurity:    Worry: Not on file    Inability: Not on file  . Transportation needs:    Medical: Not on file    Non-medical: Not on file  Tobacco Use  . Smoking status: Never Smoker  . Smokeless tobacco: Never Used  Substance and Sexual Activity  . Alcohol use: No  . Drug use: No  . Sexual activity: Yes     Birth control/protection: Surgical  Lifestyle  . Physical activity:    Days per week: Not on file    Minutes per session: Not on file  . Stress: Not on file  Relationships  . Social connections:    Talks on phone: Not on file    Gets together: Not on file    Attends religious service: Not on file    Active member of club or organization: Not on file    Attends meetings of clubs or organizations: Not on file    Relationship status: Not on file  . Intimate partner violence:    Fear of current or ex partner: Not on file    Emotionally abused: Not on file    Physically abused: Not on file    Forced sexual activity: Not on file  Other Topics Concern  . Not on file  Social History Narrative   Drinks 1 cup of coffee a day      Current Meds  Medication Sig  . Alpha-Lipoic Acid 100 MG CAPS Take by mouth.  . cloNIDine (CATAPRES - DOSED IN MG/24 HR) 0.2 mg/24hr patch Place 0.2 mg onto the skin once a week.  . diltiazem (TIAZAC) 360 MG 24 hr capsule Take 360 mg by mouth daily.  Marland Kitchen escitalopram (LEXAPRO) 10 MG tablet Take 10 mg by mouth daily. Take 1/2 a tablet by mouth once a day  . Fish Oil OIL by Does not apply route.  Marland Kitchen ibuprofen (ADVIL,MOTRIN) 200 MG tablet Take 200 mg by mouth as needed.  Marland Kitchen levothyroxine (SYNTHROID, LEVOTHROID) 50 MCG tablet Take 50 mcg by mouth daily before breakfast.  . metoprolol-hydrochlorothiazide (LOPRESSOR HCT) 100-25 MG per tablet Take 1 tablet by mouth daily. Takes 1/2 tablet BID  . olmesartan (BENICAR) 40 MG tablet Take 40 mg by mouth daily.      Review of systems complete and found to be negative unless listed above in HPI    Physical exam Blood pressure (!) 148/70, pulse (!) 48, height 5\' 5"  (1.651 m), weight 153 lb (69.4 kg), SpO2 97 %. General: NAD, appears younger than stated age. Neck: No JVD, no thyromegaly or thyroid nodule.  Lungs: Clear to auscultation bilaterally with normal respiratory effort. CV: Nondisplaced PMI.  Bradycardic,  regular rhythm, normal S1/S2, no S3/S4, no murmur.  No peripheral edema.  No carotid bruit.    Abdomen: Soft, nontender, no distention.  Skin: Intact without lesions or rashes.  Neurologic: Alert and oriented x 3.  Psych: Normal affect. Extremities: No clubbing or cyanosis.  HEENT: Normal.   ECG: Most recent ECG reviewed.   Labs: Lab Results  Component Value Date/Time   K 4.3 05/23/2018 08:23 AM  BUN 12 05/23/2018 08:23 AM   CREATININE 1.15 (H) 05/23/2018 08:23 AM   ALT 21 05/16/2009 11:16 AM   TSH 2.183 05/23/2018 08:23 AM   HGB 13.9 05/23/2018 08:23 AM     Lipids: No results found for: LDLCALC, LDLDIRECT, CHOL, TRIG, HDL      ASSESSMENT AND PLAN:  1.  Accelerated hypertension: She is on multiple antihypertensive agents as detailed above which include clonidine, long-acting diltiazem, metoprolol, hydrochlorothiazide, and olmesartan.  Blood pressure is elevated today.  She denies anginal symptoms.  I suspect left renal artery occlusion is contributing to this.  She was evaluated by vascular surgery.  No interventions planned.  I will add hydralazine 25 mg 3 times daily.  I may consider spironolactone in the future.  2.  Peripheral vascular disease: Occluded left renal artery with mild stenosis of the right renal artery and 70 to 99% stenosis of the celiac and superior mesenteric arteries.  She was evaluated by vascular surgery.  No interventions planned.  3.  Chronic kidney disease stage III: BUN 12 and creatinine 1.15 on 05/23/2018.  She has a nonfunctional left kidney.   Disposition: Follow up in 3 months   Signed: Kate Sable, M.D., F.A.C.C.  10/25/2018, 1:43 PM

## 2018-10-25 NOTE — Patient Instructions (Signed)
Medication Instructions:  Start Hydralazine 25 mg three times a day If you need a refill on your cardiac medications before your next appointment, please call your pharmacy.   Lab work: None today If you have labs (blood work) drawn today and your tests are completely normal, you will receive your results only by: Marland Kitchen MyChart Message (if you have MyChart) OR . A paper copy in the mail If you have any lab test that is abnormal or we need to change your treatment, we will call you to review the results.  Testing/Procedures: None today  Follow-Up: At [facility], you and your health needs are our priority.  As part of our continuing mission to provide you with exceptional heart care, we have created designated Provider Care Teams.  These Care Teams include your primary Cardiologist (physician) and Advanced Practice Providers (APPs -  Physician Assistants and Nurse Practitioners) who all work together to provide you with the care you need, when you need it. You will need a follow up appointment in 4 months.  Please call our office 2 months in advance to schedule this appointment.  You may see Kate Sable, MD or one of the following Advanced Practice Providers on your designated Care Team:   Bernerd Pho, PA-C [facility]) . Ermalinda Barrios, PA-C ([place])  Any Other Special Instructions Will Be Listed Below (If Applicable). None

## 2018-11-11 DIAGNOSIS — E7849 Other hyperlipidemia: Secondary | ICD-10-CM | POA: Diagnosis not present

## 2018-11-11 DIAGNOSIS — E663 Overweight: Secondary | ICD-10-CM | POA: Diagnosis not present

## 2018-11-11 DIAGNOSIS — I10 Essential (primary) hypertension: Secondary | ICD-10-CM | POA: Diagnosis not present

## 2018-11-11 DIAGNOSIS — Z6825 Body mass index (BMI) 25.0-25.9, adult: Secondary | ICD-10-CM | POA: Diagnosis not present

## 2019-02-07 ENCOUNTER — Telehealth: Payer: Medicare HMO | Admitting: Cardiovascular Disease

## 2019-04-18 DIAGNOSIS — Z1389 Encounter for screening for other disorder: Secondary | ICD-10-CM | POA: Diagnosis not present

## 2019-04-18 DIAGNOSIS — R7309 Other abnormal glucose: Secondary | ICD-10-CM | POA: Diagnosis not present

## 2019-04-18 DIAGNOSIS — I10 Essential (primary) hypertension: Secondary | ICD-10-CM | POA: Diagnosis not present

## 2019-04-18 DIAGNOSIS — E663 Overweight: Secondary | ICD-10-CM | POA: Diagnosis not present

## 2019-04-18 DIAGNOSIS — E7849 Other hyperlipidemia: Secondary | ICD-10-CM | POA: Diagnosis not present

## 2019-04-18 DIAGNOSIS — E785 Hyperlipidemia, unspecified: Secondary | ICD-10-CM | POA: Diagnosis not present

## 2019-04-18 DIAGNOSIS — I739 Peripheral vascular disease, unspecified: Secondary | ICD-10-CM | POA: Diagnosis not present

## 2019-04-18 DIAGNOSIS — E039 Hypothyroidism, unspecified: Secondary | ICD-10-CM | POA: Diagnosis not present

## 2019-04-18 DIAGNOSIS — Z6826 Body mass index (BMI) 26.0-26.9, adult: Secondary | ICD-10-CM | POA: Diagnosis not present

## 2019-05-02 ENCOUNTER — Ambulatory Visit (INDEPENDENT_AMBULATORY_CARE_PROVIDER_SITE_OTHER): Payer: Medicare HMO

## 2019-05-02 ENCOUNTER — Ambulatory Visit (INDEPENDENT_AMBULATORY_CARE_PROVIDER_SITE_OTHER): Payer: Medicare HMO | Admitting: Orthopedic Surgery

## 2019-05-02 ENCOUNTER — Encounter: Payer: Self-pay | Admitting: Orthopedic Surgery

## 2019-05-02 DIAGNOSIS — M545 Low back pain, unspecified: Secondary | ICD-10-CM

## 2019-05-02 DIAGNOSIS — R197 Diarrhea, unspecified: Secondary | ICD-10-CM | POA: Diagnosis not present

## 2019-05-02 DIAGNOSIS — R58 Hemorrhage, not elsewhere classified: Secondary | ICD-10-CM | POA: Diagnosis not present

## 2019-05-02 DIAGNOSIS — M79671 Pain in right foot: Secondary | ICD-10-CM

## 2019-05-02 DIAGNOSIS — M25571 Pain in right ankle and joints of right foot: Secondary | ICD-10-CM | POA: Diagnosis not present

## 2019-05-02 DIAGNOSIS — R29810 Facial weakness: Secondary | ICD-10-CM | POA: Diagnosis not present

## 2019-05-02 DIAGNOSIS — Z743 Need for continuous supervision: Secondary | ICD-10-CM | POA: Diagnosis not present

## 2019-05-02 MED ORDER — PREDNISONE 10 MG PO TABS: 10.0000 mg | ORAL_TABLET | Freq: Every day | ORAL | 1 refills | Status: DC

## 2019-05-02 NOTE — Progress Notes (Signed)
Office Visit Note   Patient: Sherry Waller           Date of Birth: 1936-05-01           MRN: [id number] Visit Date: 05/02/2019              Requested by: Sharilyn Sites, [address],  [place] [address] PCP: Sharilyn Sites, MD  Chief Complaint  Patient presents with  . Right Foot - Pain  . Left Foot - Pain      HPI: Patient is an 83 year old woman who presents with an 8-year history of numbness over the lateral aspect of the right ankle which she states radiates proximally.  Patient states that after prolonged walking she has numbness pain around both hips that radiates distally.  Assessment & Plan: Visit Diagnoses:  1. Pain in right ankle and joints of right foot   2. Pain in right foot   3. Low back pain, unspecified back pain laterality, unspecified chronicity, unspecified whether sciatica present     Plan: Plan: With her advanced degenerative disc disease of her lumbar spine we will start her on 10 mg of prednisone with breakfast follow-up in 2 weeks.  Discussed that if we are showing progress with the oral prednisone we could continue this treatment otherwise would recommend proceeding with an MRI scan for the possibility of a epidural steroid injection.  Follow-Up Instructions: Return in about 2 weeks (around 05/16/2019).   Ortho Exam  Patient is alert, oriented, no adenopathy, well-dressed, normal affect, normal respiratory effort. Examination of both lower extremities patient has no focal motor weakness.  She has a negative straight leg raise bilaterally.  Her maximum area of pain is over the peroneal tendons however these are nontender to palpation.  Hip flexion knee flexion extension ankle dorsiflexion plantarflexion and EHL strength is essentially equal.  Imaging: Xr Ankle 2 Views Right  Result Date: 05/02/2019 2 view radiographs of the right ankle shows a congruent mortise no osteochondral defects.  Xr Foot 2 Views Right  Result Date: 05/02/2019  2 view radiographs of the right foot shows a cavovarus foot with a plantar spur on the calcaneus without fractures.  Xr Lumbar Spine 2-3 Views  Result Date: 05/02/2019 2 view radiographs of the lumbar spine shows calcification of the aorta but no aneurysm.  In the lateral view patient has essentially complete collapse of all the joint spaces with large osteophytic bone spurs on the PA view she has a degenerative scoliosis with complete collapse at L4-5 with osteophytic bone spurs throughout the lumbar spine and  degenerative scoliosis.  No images are attached to the encounter.  Labs: No results found for: HGBA1C, ESRSEDRATE, CRP, LABURIC, REPTSTATUS, GRAMSTAIN, CULT, LABORGA   Lab Results  Component Value Date   ALBUMIN 4.2 05/16/2009    No results found for: MG No results found for: VD25OH  No results found for: PREALBUMIN CBC EXTENDED Latest Ref Rng & Units 05/23/2018 08/16/2013 05/16/2009  WBC 4.0 - 10.5 K/uL 5.4 7.2 5.6  RBC 3.87 - 5.11 MIL/uL 4.77 4.66 4.47  HGB 12.0 - 15.0 g/dL 13.9 13.9 13.8  HCT 36.0 - 46.0 % 43.0 40.8 39.7  PLT 150 - 400 K/uL 214 239 227  NEUTROABS 1.7 - 7.7 K/uL 3.4 4.9 -  LYMPHSABS 0.7 - 4.0 K/uL 1.2 1.4 -     There is no height or weight on file to calculate BMI.  Orders:  Orders Placed This Encounter  Procedures  . XR  Foot 2 Views Right  . XR Ankle 2 Views Right  . XR Lumbar Spine 2-3 Views   No orders of the defined types were placed in this encounter.    Procedures: No procedures performed  Clinical Data: No additional findings.  ROS:  All other systems negative, except as noted in the HPI. Review of Systems  Objective: Vital Signs: There were no vitals taken for this visit.  Specialty Comments:  No specialty comments available.  PMFS History: There are no active problems to display for this patient.  Past Medical History:  Diagnosis Date  . Hypertension   . Thyroid disease     Family History  Problem Relation Age of  Onset  . Brain cancer Father   . Ovarian cancer Sister   . Brain cancer Sister     Past Surgical History:  Procedure Laterality Date  . ABDOMINAL HYSTERECTOMY    . CHOLECYSTECTOMY    . TONSILLECTOMY     Social History   Occupational History  . Not on file  Tobacco Use  . Smoking status: Never Smoker  . Smokeless tobacco: Never Used  Substance and Sexual Activity  . Alcohol use: No  . Drug use: No  . Sexual activity: Yes    Birth control/protection: Surgical

## 2019-05-03 DIAGNOSIS — N183 Chronic kidney disease, stage 3 (moderate): Secondary | ICD-10-CM | POA: Diagnosis not present

## 2019-05-16 ENCOUNTER — Ambulatory Visit (INDEPENDENT_AMBULATORY_CARE_PROVIDER_SITE_OTHER): Payer: Medicare HMO | Admitting: Orthopedic Surgery

## 2019-05-16 ENCOUNTER — Encounter: Payer: Self-pay | Admitting: Orthopedic Surgery

## 2019-05-16 DIAGNOSIS — M5416 Radiculopathy, lumbar region: Secondary | ICD-10-CM | POA: Diagnosis not present

## 2019-05-16 NOTE — Progress Notes (Signed)
Office Visit Note   Patient: Sherry Waller           Date of Birth: [birth year]           MRN: [id number] Visit Date: 05/16/2019              Requested by: Sharilyn Sites, [employer] [place],  [place] [address] PCP: Sharilyn Sites, MD  No chief complaint on file.     HPI: Patient is an 83 year old woman who presents in follow-up for numbness over her bilateral feet this radiates across the dorsum of her foot and up to the lateral aspect of bilateral ankles.  Right worse than left.  Also complains of some plantar numbness.  She states that after prolonged walking and standing the numbness and pain worsens.  She also can have some shooting pain into her hips and groins that radiates distally after prolonged standing.    She has been having difficulty with this for the last 8 years.  States that she has been told by a neurologist that she does not have neuropathy in her feet however she has no feeling in the bottoms of her feet often feels unstable especially on uneven terrain   Completed a course of prednisone for the last 10 days states this did improve her symptoms in her lower extremities has not not had any issues with pain in her groin hips or dorsums of her feet since starting the prednisone.  Would like to hold off on epidural steroid injection at this time.  Assessment & Plan: Visit Diagnoses:  1. Radiculopathy, lumbar region     Plan: As we had good relief with the prednisone discussed that this could be used again in the future for recurrence of symptoms.  However we will proceed with MRI to better characterize her degenerative disc disease     Follow-Up Instructions: Return if symptoms worsen or fail to improve.   Ortho Exam  Patient is alert, oriented, no adenopathy, well-dressed, normal affect, normal respiratory effort.  Examination of both lower extremities patient has no focal motor weakness.  She has a negative straight leg raise bilaterally.  Her  maximum area of pain is over the peroneal tendons however these are nontender to palpation.  Hip flexion knee flexion extension ankle dorsiflexion plantarflexion and EHL strength is essentially equal.  Imaging: No results found. No images are attached to the encounter.  Labs: No results found for: HGBA1C, ESRSEDRATE, CRP, LABURIC, REPTSTATUS, GRAMSTAIN, CULT, LABORGA   Lab Results  Component Value Date   ALBUMIN 4.2 05/16/2009    No results found for: MG No results found for: VD25OH  No results found for: PREALBUMIN CBC EXTENDED Latest Ref Rng & Units 05/23/2018 08/16/2013 05/16/2009  WBC 4.0 - 10.5 K/uL 5.4 7.2 5.6  RBC 3.87 - 5.11 MIL/uL 4.77 4.66 4.47  HGB 12.0 - 15.0 g/dL 13.9 13.9 13.8  HCT 36.0 - 46.0 % 43.0 40.8 39.7  PLT 150 - 400 K/uL 214 239 227  NEUTROABS 1.7 - 7.7 K/uL 3.4 4.9 -  LYMPHSABS 0.7 - 4.0 K/uL 1.2 1.4 -     There is no height or weight on file to calculate BMI.  Orders:  Orders Placed This Encounter  Procedures  . MR LUMBAR SPINE WO CONTRAST   No orders of the defined types were placed in this encounter.    Procedures: No procedures performed  Clinical Data: No additional findings.  ROS:  All other systems negative, except as noted in  the HPI. Review of Systems  Constitutional: Negative for chills and fever.  Musculoskeletal: Positive for arthralgias and myalgias. Negative for back pain.  Neurological: Positive for numbness. Negative for weakness.    Objective: Vital Signs: There were no vitals taken for this visit.  Specialty Comments:  No specialty comments available.  PMFS History: There are no active problems to display for this patient.  Past Medical History:  Diagnosis Date  . Hypertension   . Thyroid disease     Family History  Problem Relation Age of Onset  . Brain cancer Father   . Ovarian cancer Sister   . Brain cancer Sister     Past Surgical History:  Procedure Laterality Date  . ABDOMINAL HYSTERECTOMY    .  CHOLECYSTECTOMY    . TONSILLECTOMY     Social History   Occupational History  . Not on file  Tobacco Use  . Smoking status: Never Smoker  . Smokeless tobacco: Never Used  Substance and Sexual Activity  . Alcohol use: No  . Drug use: No  . Sexual activity: Yes    Birth control/protection: Surgical

## 2019-05-26 ENCOUNTER — Ambulatory Visit (HOSPITAL_COMMUNITY)
Admission: RE | Admit: 2019-05-26 | Discharge: 2019-05-26 | Disposition: A | Discharge status: Home / Self Care | Payer: Medicare HMO | Source: Ambulatory Visit | Attending: Family | Admitting: Family

## 2019-05-26 ENCOUNTER — Other Ambulatory Visit: Payer: Self-pay

## 2019-05-26 DIAGNOSIS — M5126 Other intervertebral disc displacement, lumbar region: Secondary | ICD-10-CM | POA: Diagnosis not present

## 2019-05-26 DIAGNOSIS — M48061 Spinal stenosis, lumbar region without neurogenic claudication: Secondary | ICD-10-CM | POA: Diagnosis not present

## 2019-05-26 DIAGNOSIS — M5416 Radiculopathy, lumbar region: Secondary | ICD-10-CM | POA: Insufficient documentation

## 2019-05-26 DIAGNOSIS — M4316 Spondylolisthesis, lumbar region: Secondary | ICD-10-CM | POA: Diagnosis not present

## 2019-06-20 ENCOUNTER — Telehealth: Payer: Self-pay | Admitting: Orthopedic Surgery

## 2019-06-20 NOTE — Telephone Encounter (Signed)
Called patient got recording mailbox have not been set up yet. Patient need to schedule an MRI review with Dr Sharol Given

## 2019-06-26 ENCOUNTER — Telehealth: Payer: Self-pay | Admitting: Orthopedic Surgery

## 2019-06-26 NOTE — Telephone Encounter (Signed)
Pt had an MRI L spine on 05/26/19 and is calling for the results. Can you please advise.

## 2019-06-26 NOTE — Telephone Encounter (Signed)
Patient called. Says she would like to know the results of her MRI. Says she does not have a computer to look at it on her My Chart. Her call back number is [phone]. Thanks

## 2019-08-07 DIAGNOSIS — R69 Illness, unspecified: Secondary | ICD-10-CM | POA: Diagnosis not present

## 2019-08-25 DIAGNOSIS — Z0001 Encounter for general adult medical examination with abnormal findings: Secondary | ICD-10-CM | POA: Diagnosis not present

## 2019-08-25 DIAGNOSIS — M6530 Trigger finger, unspecified finger: Secondary | ICD-10-CM | POA: Diagnosis not present

## 2019-08-25 DIAGNOSIS — Z681 Body mass index (BMI) 19 or less, adult: Secondary | ICD-10-CM | POA: Diagnosis not present

## 2019-11-26 ENCOUNTER — Ambulatory Visit: Payer: Medicare HMO | Attending: Internal Medicine

## 2019-11-26 DIAGNOSIS — Z23 Encounter for immunization: Secondary | ICD-10-CM | POA: Insufficient documentation

## 2019-11-26 NOTE — Progress Notes (Signed)
   Covid-19 Vaccination Clinic  Name:  ALYSABETH HIRSCH    MRN: [id number] DOB: [birth year]  11/26/2019  Ms. Huschka was observed post Covid-19 immunization for 15 minutes without incidence. She was provided with Vaccine Information Sheet and instruction to access the V-Safe system.   Ms. Perrotti was instructed to call 911 with any severe reactions post vaccine: Marland Kitchen Difficulty breathing  . Swelling of your face and throat  . A fast heartbeat  . A bad rash all over your body  . Dizziness and weakness    Immunizations Administered    Name Date Dose VIS Date Route   Pfizer COVID-19 Vaccine 11/26/2019  2:47 PM 0.3 mL 09/22/2019 Intramuscular   Manufacturer: [place]   Lot: [id number]   [place]: [id number]

## 2019-12-19 ENCOUNTER — Ambulatory Visit: Payer: Medicare HMO | Attending: Internal Medicine

## 2019-12-19 DIAGNOSIS — Z23 Encounter for immunization: Secondary | ICD-10-CM

## 2019-12-19 NOTE — Progress Notes (Signed)
   Covid-19 Vaccination Clinic  Name:  Sherry Waller    MRN: [id number] DOB: 1936-01-27  12/19/2019  [name] was observed post Covid-19 immunization for 15 minutes without incident. She was provided with Vaccine Information Sheet and instruction to access the V-Safe system.   [name] was instructed to call 911 with any severe reactions post vaccine: Marland Kitchen Difficulty breathing  . Swelling of face and throat  . A fast heartbeat  . A bad rash all over body  . Dizziness and weakness   Immunizations Administered    Name Date Dose VIS Date Route   Pfizer COVID-19 Vaccine 12/19/2019  1:22 PM 0.3 mL 09/22/2019 Intramuscular   Manufacturer: [place]   Lot: [id number]   [place]: [id number]

## 2019-12-20 ENCOUNTER — Ambulatory Visit: Payer: Medicare HMO

## 2020-05-30 ENCOUNTER — Ambulatory Visit: Payer: Medicare HMO | Admitting: Orthopedic Surgery

## 2020-05-30 ENCOUNTER — Encounter: Payer: Self-pay | Admitting: Orthopedic Surgery

## 2020-05-30 VITALS — Ht 65.0 in | Wt 153.0 lb

## 2020-05-30 DIAGNOSIS — M5416 Radiculopathy, lumbar region: Secondary | ICD-10-CM

## 2020-05-30 NOTE — Progress Notes (Signed)
Office Visit Note   Patient: Sherry Waller           Date of Birth: [birth year]           MRN: [id number] Visit Date: 05/30/2020              Requested by: Sharilyn Sites, [address],  [place] [address] PCP: Sharilyn Sites, MD  Chief Complaint  Patient presents with  . Lower Back - Pain  . Right Foot - Pain  . Left Foot - Pain      HPI: Patient is an 84 year old woman who presents in follow-up status post MRI scan of the lumbar spine.  Patient states she has pain in the back essentially all the time with pain radiating from the right buttocks down to the right foot.  She states she cannot stand for prolonged periods of time difficulty with activities she states it is getting worse.  Assessment & Plan: Visit Diagnoses:  1. Radiculopathy, lumbar region     Plan: We will set up an appoint with Dr. Ernestina Patches for transforaminal injection most likely at L4-5 on the right.  Follow-Up Instructions: Return if symptoms worsen or fail to improve.   Ortho Exam  Patient is alert, oriented, no adenopathy, well-dressed, normal affect, normal respiratory effort. Examination patient has a positive straight leg raise on the right no focal motor weakness.  Review of the MRI scan shows progressive degenerative disc disease and facet hypertrophy at L4-5 resulting in stenosis of the right foramen.  Imaging: No results found. No images are attached to the encounter.  Labs: No results found for: HGBA1C, ESRSEDRATE, CRP, LABURIC, REPTSTATUS, GRAMSTAIN, CULT, LABORGA   Lab Results  Component Value Date   ALBUMIN 4.2 05/16/2009    No results found for: MG No results found for: VD25OH  No results found for: PREALBUMIN CBC EXTENDED Latest Ref Rng & Units 05/23/2018 08/16/2013 05/16/2009  WBC 4.0 - 10.5 K/uL 5.4 7.2 5.6  RBC 3.87 - 5.11 MIL/uL 4.77 4.66 4.47  HGB 12.0 - 15.0 g/dL 13.9 13.9 13.8  HCT 36 - 46 % 43.0 40.8 39.7  PLT 150 - 400 K/uL 214 239 227  NEUTROABS 1.7 - 7.7  K/uL 3.4 4.9 -  LYMPHSABS 0.7 - 4.0 K/uL 1.2 1.4 -     Body mass index is 25.46 kg/m.  Orders:  Orders Placed This Encounter  Procedures  . Ambulatory referral to Physical Medicine Rehab   No orders of the defined types were placed in this encounter.    Procedures: No procedures performed  Clinical Data: No additional findings.  ROS:  All other systems negative, except as noted in the HPI. Review of Systems  Objective: Vital Signs: Ht 5\' 5"  (1.651 m)   Wt 153 lb (69.4 kg)   BMI 25.46 kg/m   Specialty Comments:  No specialty comments available.  PMFS History: There are no problems to display for this patient.  Past Medical History:  Diagnosis Date  . Hypertension   . Thyroid disease     Family History  Problem Relation Age of Onset  . Brain cancer Father   . Ovarian cancer Sister   . Brain cancer Sister     Past Surgical History:  Procedure Laterality Date  . ABDOMINAL HYSTERECTOMY    . CHOLECYSTECTOMY    . TONSILLECTOMY     Social History   Occupational History  . Not on file  Tobacco Use  . Smoking status: Never Smoker  .  Smokeless tobacco: Never Used  Vaping Use  . Vaping Use: Never used  Substance and Sexual Activity  . Alcohol use: No  . Drug use: No  . Sexual activity: Yes    Birth control/protection: Surgical

## 2020-06-04 ENCOUNTER — Telehealth: Payer: Self-pay | Admitting: Orthopedic Surgery

## 2020-06-04 ENCOUNTER — Other Ambulatory Visit: Payer: Self-pay | Admitting: Orthopedic Surgery

## 2020-06-04 MED ORDER — PREDNISONE 10 MG PO TABS
10.0000 mg | ORAL_TABLET | Freq: Every day | ORAL | 1 refills | Status: DC
Start: 2020-06-04 — End: 2023-10-03

## 2020-06-04 NOTE — Telephone Encounter (Signed)
Prednisone rx reordered

## 2020-06-04 NOTE — Telephone Encounter (Signed)
Pts son Marya Amsler called stating they were supposed to get a F/U call after the pts last visit on 05/30/20 but never heard anything back and would like to speak with autumn.   [phone] * If you call the pts number the dad may pick up but he isn't complicit.

## 2020-06-04 NOTE — Telephone Encounter (Signed)
Pt is being referred for ESI with [facility] and son wants to know if she can have something for the discomfort while pending auth for procedure like a pred taper?

## 2020-06-05 NOTE — Telephone Encounter (Signed)
Made in error

## 2020-06-05 NOTE — Telephone Encounter (Signed)
I called and sw pt and she states that it is ok to speak with her son Marya Amsler. Advised that the appt that she is waiting for is with [facility] for ESI and right now it is pending approval from insurance. We did call in rx for pred and pt states that she is aware and has picked that up. She iwll just await sch with [facility] and call with any other questions.

## 2020-06-07 ENCOUNTER — Telehealth: Payer: Self-pay | Admitting: Physical Medicine and Rehabilitation

## 2020-06-07 NOTE — Telephone Encounter (Signed)
Patient's son Marya Amsler called requesting a call back about update of approval for patient's injections. Marya Amsler also has a couple of question for [employer] about Dr. Romona Curls soonest appt. Greg phone number is [phone].

## 2020-06-07 NOTE — Telephone Encounter (Signed)
Returned call and advised that this is still pending. He will call the insurance company.

## 2020-06-07 NOTE — Telephone Encounter (Signed)
Patient's son Marya Amsler returned call asked for a call back concerning his mother. The number to contact Marya Amsler is [phone]

## 2020-06-10 NOTE — Telephone Encounter (Signed)
Needs to advise on status of auth.

## 2020-06-12 ENCOUNTER — Telehealth: Payer: Self-pay

## 2020-06-12 NOTE — Telephone Encounter (Signed)
Denial was send to Dr. Sharol Given.

## 2020-06-12 NOTE — Telephone Encounter (Signed)
Patient son called ,Sherry Waller  Patient son stated Dr.Duda told them to make a appointment with Dr.Newton for am injection he is still waiting for a call back for appointment to be made.  Call [phone]

## 2020-06-12 NOTE — Telephone Encounter (Signed)
Called patient's son to advise that this was denied by insurance and we are awaiting a peer to peer.

## 2020-06-12 NOTE — Telephone Encounter (Signed)
Pt denial has been send to Dr Sharol Given.

## 2020-06-14 ENCOUNTER — Other Ambulatory Visit: Payer: Self-pay

## 2020-06-14 ENCOUNTER — Telehealth: Payer: Self-pay

## 2020-06-14 DIAGNOSIS — M5416 Radiculopathy, lumbar region: Secondary | ICD-10-CM

## 2020-06-14 NOTE — Telephone Encounter (Signed)
Call pt to advise insurance denied ESI and must have 6 weeks of PT before we can try to obtain auth again.

## 2020-06-19 NOTE — Telephone Encounter (Signed)
I called pt to advise of the denial of her ESI by insurance and the referral for physical therapy. She states that at the moment she is having other medical issues and she does not want to participate in therapy at this time but will call us if that changes. Just an [place]

## 2020-07-09 ENCOUNTER — Other Ambulatory Visit (HOSPITAL_COMMUNITY): Payer: Self-pay | Admitting: Nephrology

## 2020-07-09 ENCOUNTER — Other Ambulatory Visit: Payer: Self-pay | Admitting: Nephrology

## 2020-07-09 DIAGNOSIS — R809 Proteinuria, unspecified: Secondary | ICD-10-CM

## 2020-07-09 DIAGNOSIS — N170 Acute kidney failure with tubular necrosis: Secondary | ICD-10-CM

## 2020-07-19 ENCOUNTER — Other Ambulatory Visit: Payer: Self-pay

## 2020-07-19 ENCOUNTER — Ambulatory Visit (HOSPITAL_COMMUNITY)
Admission: RE | Admit: 2020-07-19 | Discharge: 2020-07-19 | Disposition: A | Discharge status: Home / Self Care | Payer: Medicare HMO | Source: Ambulatory Visit | Attending: Nephrology | Admitting: Nephrology

## 2020-07-19 DIAGNOSIS — R809 Proteinuria, unspecified: Secondary | ICD-10-CM | POA: Diagnosis not present

## 2020-07-19 DIAGNOSIS — N170 Acute kidney failure with tubular necrosis: Secondary | ICD-10-CM | POA: Diagnosis present

## 2022-08-11 IMAGING — US US RENAL
1 series · 14 of 25 positions shown · non-contrast
Comparison: None.

CLINICAL DATA: 84-year-old female with acute renal failure and
peritoneal area.

EXAM:
RENAL / URINARY TRACT ULTRASOUND COMPLETE

[Series 1: us renal · 14 of 43 slices shown]
[im 1/43]
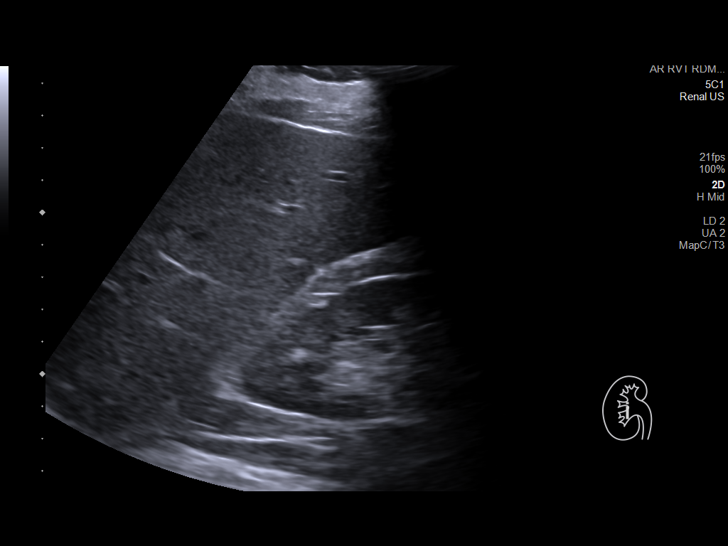
[im 4/43]
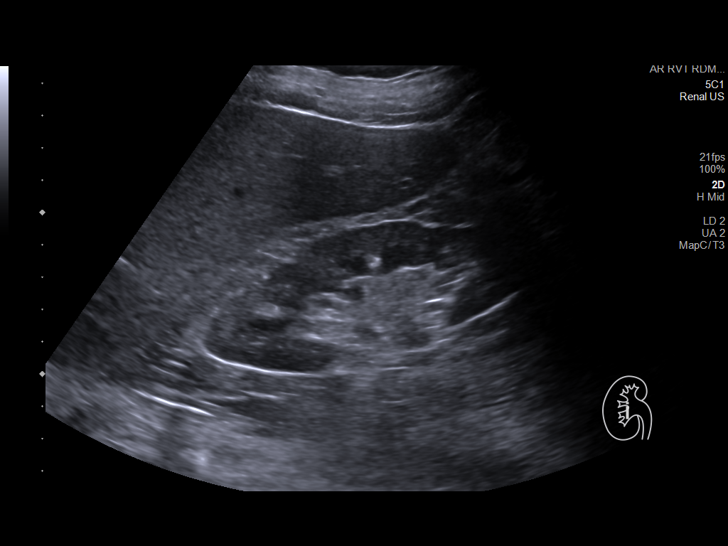
[im 8/43]
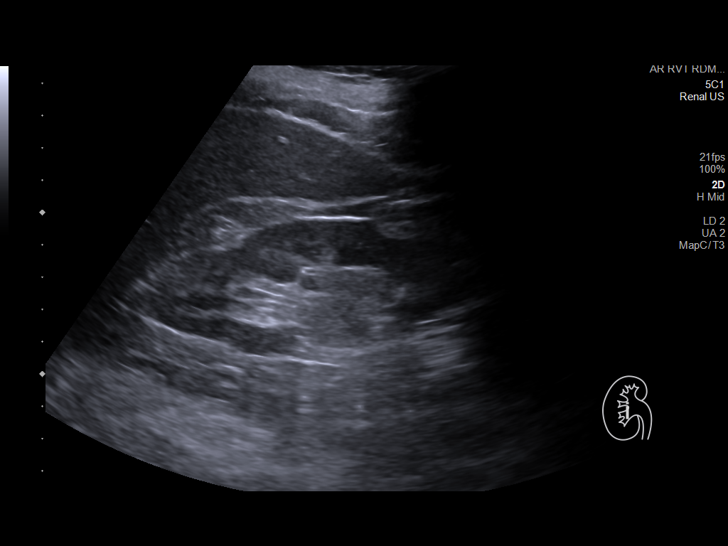
[im 11/43]
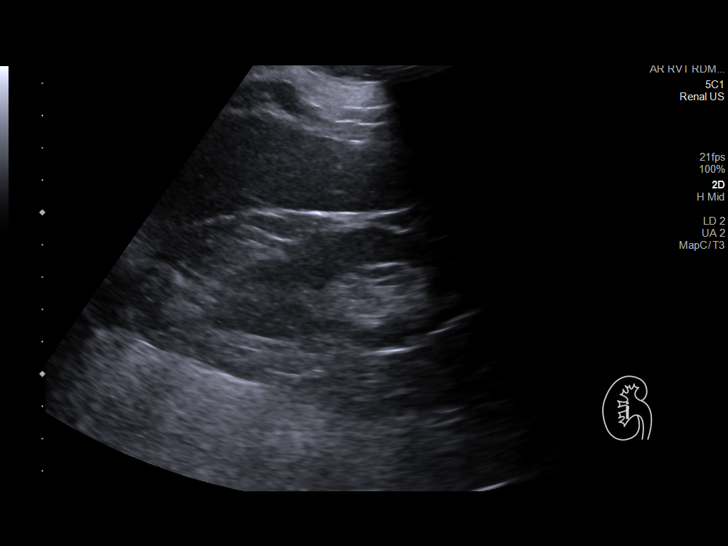
[im 15/43]
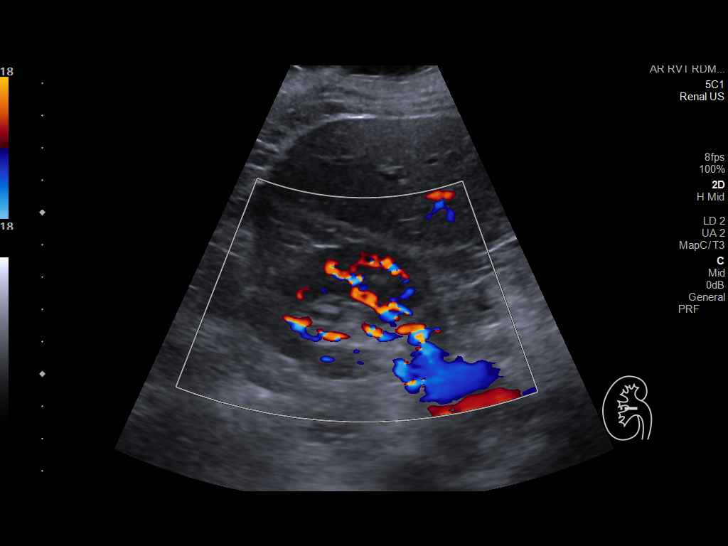
[im 16/43]
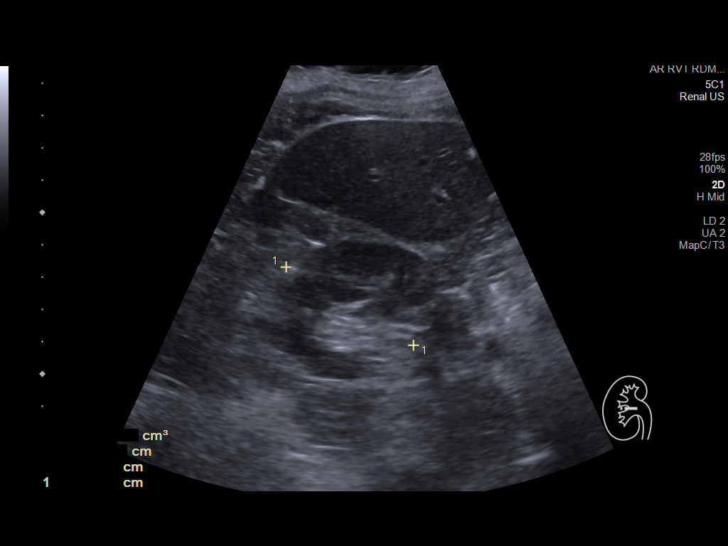
[im 20/43]
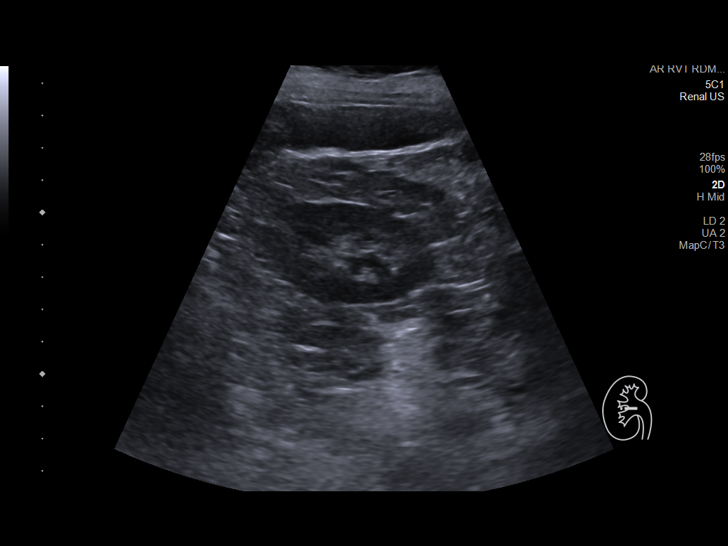
[im 23/43]
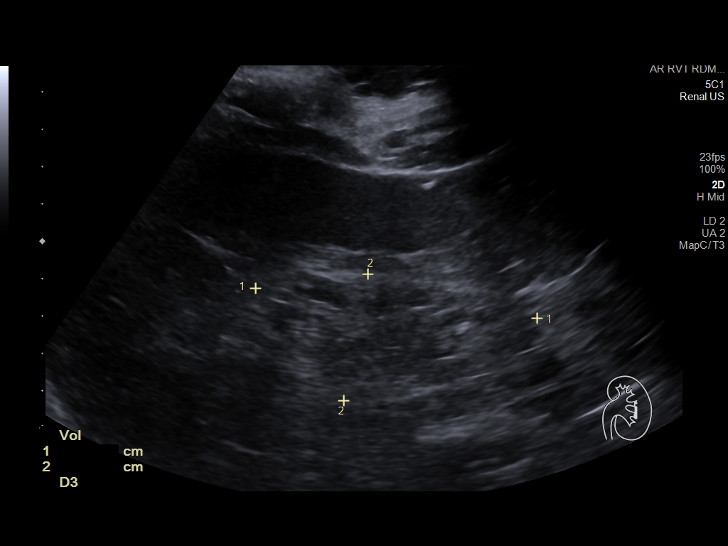
[im 27/43]
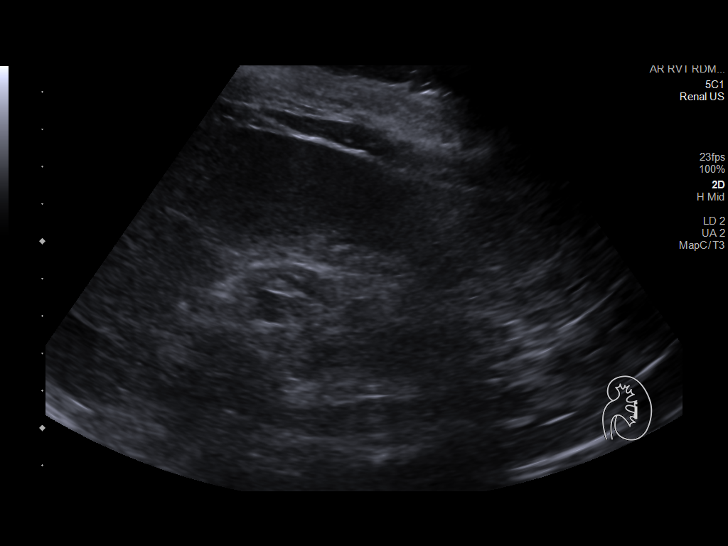
[im 29/43]
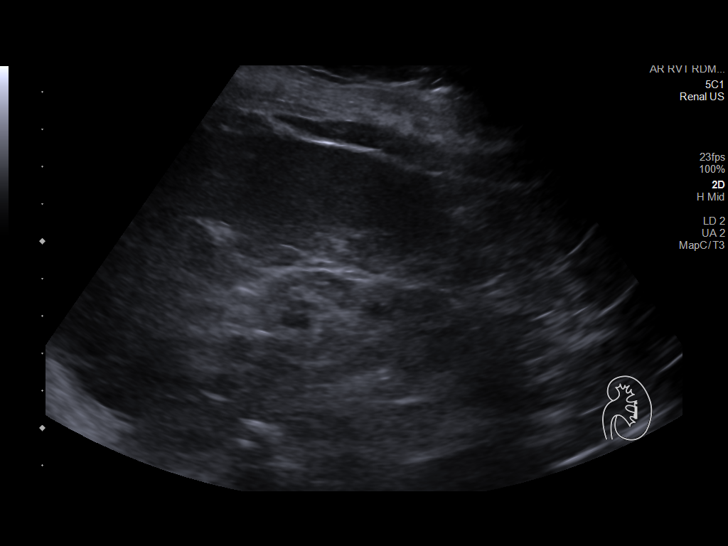
[im 32/43]
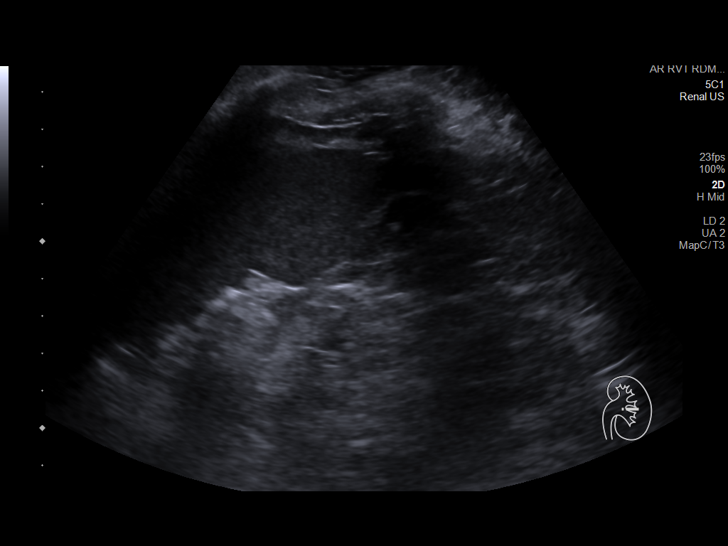
[im 36/43]
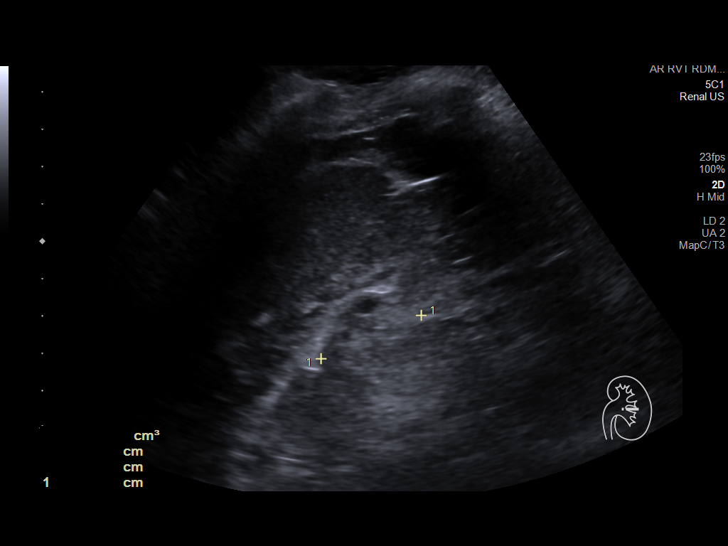
[im 39/43]
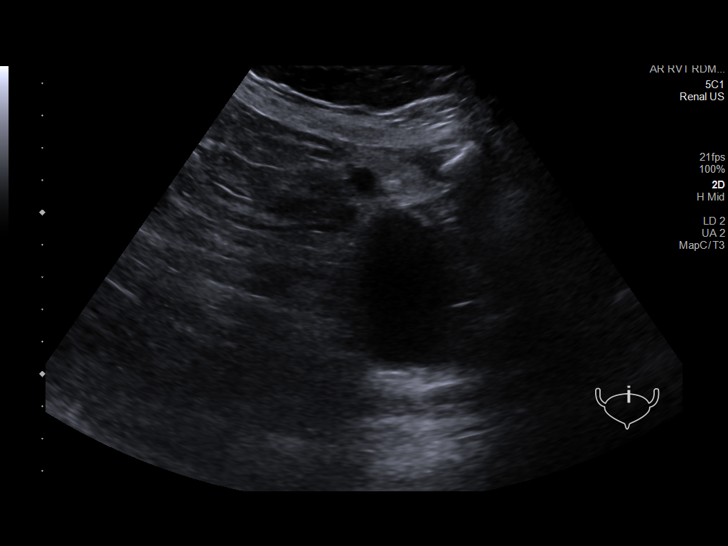
[im 43/43]
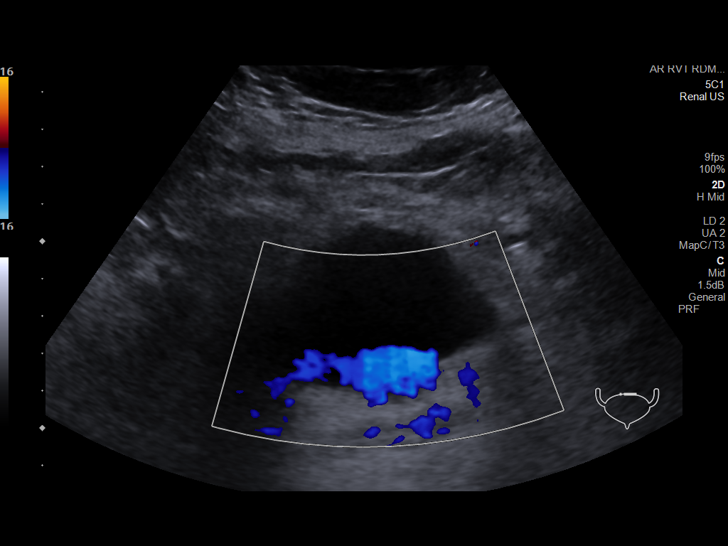

[14 of 25 positions shown; findings below may reference images not displayed]

FINDINGS: Right Kidney:

Renal measurements: 10.9 x 4.8 x 4.6 cm = volume: 126 mL. Normal
echogenicity. No hydronephrosis or shadowing stone.

Left Kidney:

Renal measurements: 7.6 x 3.5 x 2.9 cm = volume: 40 mL. The left
kidney is atrophic and echogenic and poorly visualized. No
hydronephrosis or shadowing stone.

Bladder:

Appears normal for degree of bladder distention. Bilateral ureteral
jets noted.

Other:

None.
IMPRESSION: Atrophic and echogenic left kidney. No hydronephrosis or shadowing
stone.

## 2022-08-13 ENCOUNTER — Ambulatory Visit (HOSPITAL_COMMUNITY)
Admission: RE | Admit: 2022-08-13 | Discharge: 2022-08-13 | Disposition: A | Discharge status: Home / Self Care | Payer: Medicare HMO | Source: Ambulatory Visit | Attending: Nephrology | Admitting: Nephrology

## 2022-08-13 ENCOUNTER — Other Ambulatory Visit (HOSPITAL_COMMUNITY): Payer: Self-pay | Admitting: Nephrology

## 2022-08-13 DIAGNOSIS — N189 Chronic kidney disease, unspecified: Secondary | ICD-10-CM | POA: Diagnosis not present

## 2022-10-08 ENCOUNTER — Ambulatory Visit: Payer: Medicare HMO | Admitting: Internal Medicine

## 2022-10-08 ENCOUNTER — Encounter: Payer: Self-pay | Admitting: Internal Medicine

## 2022-10-08 VITALS — BP 132/70 | HR 80 | Temp 97.7°F | Ht 65.0 in | Wt 143.6 lb

## 2022-10-08 DIAGNOSIS — R0609 Other forms of dyspnea: Secondary | ICD-10-CM

## 2022-10-08 NOTE — Progress Notes (Signed)
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  referred to pulmonary clinic in [place]  10/08/2022 by Theador Hawthorne for post covid doe with no baseline resp problems    History of Present Illness  10/08/2022  Pulmonary/ 1st office eval/ Sherry Waller / [facility]  Chief Complaint  Patient presents with   Consult    SOB post covid   Dyspnea:  limited by neuropathy not doe / mb is 100 ft down from house and does so s stopping and housework tol as before covid though recovery took sev months Cough: none  Sleep: able to lie flat/ one pillow  SABA use: none  02: none   No obvious day to day or daytime pattern/variability or assoc excess/ purulent sputum or mucus plugs or hemoptysis or cp or chest tightness, subjective wheeze or overt sinus or hb symptoms.   Sleeping  without nocturnal  or early am exacerbation  of respiratory  c/o's or need for noct saba. Also denies any obvious fluctuation of symptoms with weather or environmental changes or other aggravating or alleviating factors except as outlined above   No unusual exposure hx or h/o childhood pna/ asthma or knowledge of premature birth.  Current Allergies, Complete Past Medical History, Past Surgical History, Family History, and Social History were reviewed in [employer] record.  ROS  The following are not active complaints unless bolded Hoarseness, sore throat, dysphagia, dental problems, itching, sneezing,  nasal congestion or discharge of excess mucus or purulent secretions, ear ache,   fever, chills, sweats, unintended wt loss or wt gain, classically pleuritic or exertional cp,  orthopnea pnd or arm/hand swelling  or leg swelling, presyncope, palpitations, abdominal pain, anorexia, nausea, vomiting, diarrhea  or change in bowel habits or change in bladder habits, change in stools or change in urine, dysuria, hematuria,  rash,  arthralgias, visual complaints, headache, numbness, weakness or ataxia or problems with walking or coordination,  change in mood or  memory.             Past Medical History:  Diagnosis Date   Hypertension    Thyroid disease     Outpatient Medications Prior to Visit - - NOTE:   Unable to verify as accurately reflecting what pt takes    Medication Sig Dispense Refill   Alpha-Lipoic Acid 100 MG CAPS Take by mouth.     cloNIDine (CATAPRES - DOSED IN MG/24 HR) 0.2 mg/24hr patch Place 0.2 mg onto the skin once a week.     diltiazem (TIAZAC) 360 MG 24 hr capsule Take 360 mg by mouth daily.     escitalopram (LEXAPRO) 10 MG tablet Take 10 mg by mouth daily. Take 1/2 a tablet by mouth once a day     Fish Oil OIL by Does not apply route.            levothyroxine (SYNTHROID, LEVOTHROID) 50 MCG tablet Take 50 mcg by mouth daily before breakfast.     metoprolol-hydrochlorothiazide (LOPRESSOR HCT) 100-25 MG per tablet Take 1 tablet by mouth daily. Takes 1/2 tablet BID     olmesartan (BENICAR) 40 MG tablet Take 40 mg by mouth daily.        30 tablet 1   hydrALAZINE (APRESOLINE) 25 MG tablet Take 1 tablet (25 mg total) by mouth 3 (three) times daily. 270 tablet 3      Objective:  BP 132/70   Pulse 80   Temp 97.7 F (36.5 C)   Ht '5\' 5"'$  (1.651 m)   Wt 143 lb 9.6 oz (65.1 kg)   SpO2 97% Comment: ra  BMI 23.90 kg/m   SpO2: 97 % (ra)  Amb pleasant  wf 3 pronged suction cup due to balance    HEENT : Oropharynx  clear         NECK :  without  apparent JVD/ palpable Nodes/TM    LUNGS: no acc muscle use,  Nl contour chest which is clear to A and P bilaterally without cough on insp or exp maneuvers   CV:  RRR  no s3 or murmur or increase in P2, and no edema   ABD:  soft and nontender with nl inspiratory excursion in the supine position. No bruits or organomegaly appreciated   MS:  Nl gait/ ext warm without deformities Or obvious joint restrictions  calf tenderness, cyanosis or  clubbing    SKIN: warm and dry without lesions    NEURO:  alert, approp, nl sensorium with  no motor or cerebellar deficits apparent.    I personally reviewed images and agree with radiology impression as follows:  CXR:   pa and lateral 08/13/22 Wnl     Assessment   DOE p covid 19 Onset with Covid 19 fall 2023 - 10/08/2022   Walked on RA  x  3  lap(s) =  approx 450  ft  @ mod pace, stopped due to end of study s sob  with lowest 02 sats 97%  - not a bit unsteady gait/ using walker   I suspect her problem was conditioning and not sigificant residual lung damage from covid but in any case she has recovered back to baseline and f/u can be prn   Each maintenance medication was reviewed in detail including emphasizing most importantly the difference between maintenance and prns and under what circumstances the prns are to be triggered using an action plan format where appropriate.  Total time for H and P, chart review, counseling,  directly observing portions of ambulatory 02 saturation study/ and generating customized AVS unique to this office visit / same day charting = 30 min with pt new to me                   [name], MD 10/08/2022

## 2022-10-09 ENCOUNTER — Encounter: Payer: Self-pay | Admitting: Internal Medicine

## 2022-10-09 ENCOUNTER — Telehealth: Payer: Self-pay | Admitting: Internal Medicine

## 2022-10-09 DIAGNOSIS — R0609 Other forms of dyspnea: Secondary | ICD-10-CM | POA: Insufficient documentation

## 2022-10-09 NOTE — Telephone Encounter (Signed)
I don't have written down when she actually developed covid - says on her sheet 2 weeks but that's not right

## 2022-10-09 NOTE — Assessment & Plan Note (Addendum)
Onset with Covid 19 fall 2023 - 10/08/2022   Walked on RA  x  3  lap(s) =  approx 450  ft  @ mod pace, stopped due to end of study s sob  with lowest 02 sats 97%  - not a bit unsteady gait/ using walker   I suspect her problem was conditioning and not sigificant residual lung damage from covid but in any case she has recovered back to baseline and f/u can be prn   Each maintenance medication was reviewed in detail including emphasizing most importantly the difference between maintenance and prns and under what circumstances the prns are to be triggered using an action plan format where appropriate.  Total time for H and P, chart review, counseling,  directly observing portions of ambulatory 02 saturation study/ and generating customized AVS unique to this office visit / same day charting = 30 min with pt new to me

## 2022-10-09 NOTE — Telephone Encounter (Signed)
Pt called back and states she tested positive for covid on 07/20/2022

## 2022-10-09 NOTE — Patient Instructions (Signed)
Follow up is as needed

## 2022-10-09 NOTE — Telephone Encounter (Signed)
ATC patient and number is not in service [phone].  ATC patient at [phone] and lvm asking patient to call back and let us know when she had tested positive for covid. Will update encounter once patient returns call.

## 2023-08-21 ENCOUNTER — Ambulatory Visit
Admission: EM | Admit: 2023-08-21 | Discharge: 2023-08-21 | Disposition: A | Discharge status: Home / Self Care | Payer: Medicare HMO

## 2023-08-21 DIAGNOSIS — K0889 Other specified disorders of teeth and supporting structures: Secondary | ICD-10-CM | POA: Diagnosis not present

## 2023-08-21 MED ORDER — AMOXICILLIN-POT CLAVULANATE 875-125 MG PO TABS: 1.0000 | ORAL_TABLET | Freq: Two times a day (BID) | ORAL | 0 refills | Status: DC

## 2023-08-21 MED ORDER — TRAMADOL HCL 50 MG PO TABS: 50.0000 mg | ORAL_TABLET | Freq: Two times a day (BID) | ORAL | 0 refills | Status: DC | PRN

## 2023-08-21 NOTE — ED Triage Notes (Signed)
Patient c/o dental pain to left lower side.   Home interventions: tylenol about 30 min ago   Started: a week ago

## 2023-08-21 NOTE — Discharge Instructions (Addendum)
Take medication as prescribed. Take over-the-counter Tylenol Arthritis Strength as needed for pain. Warm salt water gargles 3-4 times daily until symptoms improve. Apply warm compresses to the affected area to help with pain and discomfort. Recommend follow-up with your dentist within the next 7 to 10 days for reevaluation. Follow-up as needed.

## 2023-08-21 NOTE — ED Provider Notes (Signed)
[facility]    CSN: [id number] Arrival date & time: 08/21/23  1414      History   Chief Complaint Chief Complaint  Patient presents with   Dental Pain    HPI Sherry Waller is a 87 y.o. female.   The history is provided by the patient.   Patient presents for complaints of dental pain that started over the past several days.  Patient reports that she had a dental procedure approximately 1 week ago.  She states since that time, she has experienced pain to the left lower side of her mouth.  She reports that she had cavities filled, and there was some manipulation of her face to raise her jawline.  Patient states that she has not had fever, chills, drainage from the mouth, chest pain, abdominal pain, nausea, vomiting, or diarrhea.  Patient also endorses swelling to the left side of her face.  She reports she has not contacted the dentist who performed the procedure because she is out of town.  Past Medical History:  Diagnosis Date   Hypertension    Thyroid disease     Patient Active Problem List   Diagnosis Date Noted   DOE p covid 19 10/09/2022    Past Surgical History:  Procedure Laterality Date   ABDOMINAL HYSTERECTOMY     CHOLECYSTECTOMY     TONSILLECTOMY      OB History   No obstetric history on file.      Home Medications    Prior to Admission medications   Medication Sig Start Date End Date Taking? Authorizing Provider  amoxicillin-clavulanate (AUGMENTIN) 875-125 MG tablet Take 1 tablet by mouth every 12 (twelve) hours. 08/21/23  Yes Leath-Warren, Sadie Haber, NP  NIFEdipine (ADALAT CC) 90 MG 24 hr tablet Take 90 mg by mouth daily. 07/21/23  Yes [provider]  traMADol (ULTRAM) 50 MG tablet Take 1 tablet (50 mg total) by mouth every 12 (twelve) hours as needed. 08/21/23  Yes Leath-Warren, Sadie Haber, NP  Alpha-Lipoic Acid 100 MG CAPS Take by mouth.    [provider]  carvedilol (COREG) 6.25 MG tablet Take 6.25 mg by mouth 2 (two)  times daily.    [provider]  cloNIDine (CATAPRES - DOSED IN MG/24 HR) 0.2 mg/24hr patch Place 0.2 mg onto the skin once a week.    [provider]  cloNIDine (CATAPRES) 0.3 MG tablet Take 0.3 mg by mouth 3 (three) times daily.    [provider]  diltiazem (TIAZAC) 360 MG 24 hr capsule Take 360 mg by mouth daily.    [provider]  escitalopram (LEXAPRO) 10 MG tablet Take 10 mg by mouth daily. Take 1/2 a tablet by mouth once a day    [provider]  Fish Oil OIL by Does not apply route.    [provider]  furosemide (LASIX) 20 MG tablet SMARTSIG:0.5 Tablet(s) By Mouth Morning-Evening    [provider]  gabapentin (NEURONTIN) 100 MG capsule Take 100 mg by mouth daily.    [provider]  hydrALAZINE (APRESOLINE) 25 MG tablet Take 1 tablet (25 mg total) by mouth 3 (three) times daily. 10/25/18 01/23/19  Laqueta Linden, MD  ibuprofen (ADVIL,MOTRIN) 200 MG tablet Take 200 mg by mouth as needed.    [provider]  levothyroxine (SYNTHROID, LEVOTHROID) 50 MCG tablet Take 50 mcg by mouth daily before breakfast.    [provider]  losartan (COZAAR) 100 MG tablet Take 100 mg by  mouth daily.    [provider]  metoprolol-hydrochlorothiazide (LOPRESSOR HCT) 100-25 MG per tablet Take 1 tablet by mouth daily. Takes 1/2 tablet BID    [provider]  olmesartan (BENICAR) 40 MG tablet Take 40 mg by mouth daily.    [provider]  predniSONE (DELTASONE) 10 MG tablet Take 1 tablet (10 mg total) by mouth daily with breakfast. 06/04/20   Nadara Mustard, MD    Family History Family History  Problem Relation Age of Onset   Brain cancer Father    Ovarian cancer Sister    Brain cancer Sister     Social History Social History   Tobacco Use   Smoking status: Never   Smokeless tobacco: Never  Vaping Use   Vaping status: Never Used  Substance Use Topics   Alcohol use: No   Drug  use: No     Allergies   Fenofibrate, Statins, and Sulfa antibiotics   Review of Systems Review of Systems Per HPI  Physical Exam Triage Vital Signs ED Triage Vitals  Encounter Vitals Group     BP 08/21/23 1557 (!) 200/78     Systolic BP Percentile --      Diastolic BP Percentile --      Pulse Rate 08/21/23 1557 85     Resp 08/21/23 1557 16     Temp 08/21/23 1557 98 F (36.7 C)     Temp Source 08/21/23 1557 Oral     SpO2 08/21/23 1557 94 %     Weight --      Height --      Head Circumference --      Peak Flow --      Pain Score 08/21/23 1600 10     Pain Loc --      Pain Education --      Exclude from Growth Chart --    No data found.  Updated Vital Signs BP (!) 198/85 (BP Location: Right Arm) Comment: patient states her BP usually runs this high  Pulse 85   Temp 98 F (36.7 C) (Oral)   Resp 16   SpO2 94%   Visual Acuity Right Eye Distance:   Left Eye Distance:   Bilateral Distance:    Right Eye Near:   Left Eye Near:    Bilateral Near:     Physical Exam Vitals and nursing note reviewed.  Constitutional:      General: She is not in acute distress.    Appearance: Normal appearance.  HENT:     Head: Normocephalic.     Mouth/Throat:     Mouth: Mucous membranes are moist.     Dentition: Abnormal dentition. Dental tenderness and gingival swelling present. No dental caries.     Pharynx: No posterior oropharyngeal erythema.     Comments: Gingival swelling noted from teeth  #18-21.  Area is tender to palpation. Eyes:     Pupils: Pupils are equal, round, and reactive to light.  Musculoskeletal:     Cervical back: Normal range of motion.  Skin:    General: Skin is warm and dry.  Neurological:     General: No focal deficit present.     Mental Status: She is alert and oriented to person, place, and time.  Psychiatric:        Mood and Affect: Mood normal.        Behavior: Behavior normal.      UC Treatments / Results  Labs (all labs ordered are  listed,  but only abnormal results are displayed) Labs Reviewed - No data to display  EKG   Radiology No results found.  Procedures Procedures (including critical care time)  Medications Ordered in UC Medications - No data to display  Initial Impression / Assessment and Plan / UC Course  I have reviewed the triage vital signs and the nursing notes.  Pertinent labs & imaging results that were available during my care of the patient were reviewed by me and considered in my medical decision making (see chart for details).  Patient with swelling around teeth numbers 18 through 21.  The area is also tender to palpation.  Given patient's current symptoms, will treat for possible dental infection postprocedure with Augmentin 875/125 mg tablets.  Patient has been taking Tylenol for pain, unable to take NSAIDs, tramadol 50 mg prescribed for more severe pain.  Supportive care recommendations were provided and discussed with the patient to include warm salt water gargles, and warm compresses.  Patient was advised to follow-up with a dentist within the next 7 to 10 days for reevaluation or sooner if symptoms worsen.  Patient is in agreement with this plan of care and verbalizes understanding.  All questions were answered.  Patient stable for discharge.  Final Clinical Impressions(s) / UC Diagnoses   Final diagnoses:  Pain, dental     Discharge Instructions      Take medication as prescribed. Take over-the-counter Tylenol Arthritis Strength as needed for pain. Warm salt water gargles 3-4 times daily until symptoms improve. Apply warm compresses to the affected area to help with pain and discomfort. Recommend follow-up with your dentist within the next 7 to 10 days for reevaluation. Follow-up as needed.     ED Prescriptions     Medication Sig Dispense Auth. Provider   amoxicillin-clavulanate (AUGMENTIN) 875-125 MG tablet Take 1 tablet by mouth every 12 (twelve) hours. 14 tablet  Leath-Warren, Sadie Haber, NP   traMADol (ULTRAM) 50 MG tablet Take 1 tablet (50 mg total) by mouth every 12 (twelve) hours as needed. 10 tablet Leath-Warren, Sadie Haber, NP      I have reviewed the PDMP during this encounter.   Abran Cantor, NP 08/21/23 [phone]

## 2023-10-01 ENCOUNTER — Other Ambulatory Visit: Payer: Self-pay

## 2023-10-01 ENCOUNTER — Encounter (HOSPITAL_COMMUNITY): Payer: Self-pay

## 2023-10-01 ENCOUNTER — Inpatient Hospital Stay (HOSPITAL_COMMUNITY)
Admission: EM | Admit: 2023-10-01 | Discharge: 2023-10-03 | DRG: 305 | Disposition: A | Discharge status: Home / Self Care | Payer: Medicare HMO | Attending: Internal Medicine | Admitting: Internal Medicine

## 2023-10-01 ENCOUNTER — Emergency Department (HOSPITAL_COMMUNITY): Payer: Medicare HMO

## 2023-10-01 DIAGNOSIS — Z1611 Resistance to penicillins: Secondary | ICD-10-CM | POA: Diagnosis present

## 2023-10-01 DIAGNOSIS — Z79890 Hormone replacement therapy (postmenopausal): Secondary | ICD-10-CM

## 2023-10-01 DIAGNOSIS — I169 Hypertensive crisis, unspecified: Principal | ICD-10-CM

## 2023-10-01 DIAGNOSIS — F32A Depression, unspecified: Secondary | ICD-10-CM | POA: Diagnosis present

## 2023-10-01 DIAGNOSIS — E1122 Type 2 diabetes mellitus with diabetic chronic kidney disease: Secondary | ICD-10-CM | POA: Diagnosis present

## 2023-10-01 DIAGNOSIS — N390 Urinary tract infection, site not specified: Secondary | ICD-10-CM | POA: Diagnosis present

## 2023-10-01 DIAGNOSIS — N1832 Chronic kidney disease, stage 3b: Secondary | ICD-10-CM | POA: Diagnosis not present

## 2023-10-01 DIAGNOSIS — E1121 Type 2 diabetes mellitus with diabetic nephropathy: Secondary | ICD-10-CM | POA: Diagnosis not present

## 2023-10-01 DIAGNOSIS — Z888 Allergy status to other drugs, medicaments and biological substances status: Secondary | ICD-10-CM

## 2023-10-01 DIAGNOSIS — G629 Polyneuropathy, unspecified: Secondary | ICD-10-CM | POA: Diagnosis present

## 2023-10-01 DIAGNOSIS — Z882 Allergy status to sulfonamides status: Secondary | ICD-10-CM

## 2023-10-01 DIAGNOSIS — D72829 Elevated white blood cell count, unspecified: Secondary | ICD-10-CM | POA: Diagnosis present

## 2023-10-01 DIAGNOSIS — I160 Hypertensive urgency: Principal | ICD-10-CM | POA: Diagnosis present

## 2023-10-01 DIAGNOSIS — I2489 Other forms of acute ischemic heart disease: Secondary | ICD-10-CM | POA: Diagnosis present

## 2023-10-01 DIAGNOSIS — Z79899 Other long term (current) drug therapy: Secondary | ICD-10-CM

## 2023-10-01 DIAGNOSIS — E039 Hypothyroidism, unspecified: Secondary | ICD-10-CM | POA: Diagnosis present

## 2023-10-01 DIAGNOSIS — I129 Hypertensive chronic kidney disease with stage 1 through stage 4 chronic kidney disease, or unspecified chronic kidney disease: Secondary | ICD-10-CM | POA: Diagnosis present

## 2023-10-01 DIAGNOSIS — Z7952 Long term (current) use of systemic steroids: Secondary | ICD-10-CM

## 2023-10-01 DIAGNOSIS — Z90710 Acquired absence of both cervix and uterus: Secondary | ICD-10-CM

## 2023-10-01 LAB — URINALYSIS, ROUTINE W REFLEX MICROSCOPIC
Bilirubin Urine: NEGATIVE
Glucose, UA: NEGATIVE mg/dL
Ketones, ur: NEGATIVE mg/dL
Nitrite: NEGATIVE
Protein, ur: >=300 mg/dL — AB
Specific Gravity, Urine: 1.012 (ref 1.005–1.030)
WBC, UA: >50 WBC/hpf (ref 0–5)
pH: 6 (ref 5.0–8.0)

## 2023-10-01 LAB — GLUCOSE, CAPILLARY
Glucose-Capillary: 171 mg/dL — ABNORMAL HIGH (ref 70–99)
Glucose-Capillary: 139 mg/dL — ABNORMAL HIGH (ref 70–99)

## 2023-10-01 LAB — TROPONIN I (HIGH SENSITIVITY)
Troponin I (High Sensitivity): 130 ng/L (ref <18)
Troponin I (High Sensitivity): 210 ng/L (ref <18)

## 2023-10-01 LAB — COMPREHENSIVE METABOLIC PANEL
ALT: 20 U/L (ref 0–44)
AST: 32 U/L (ref 15–41)
Albumin: 4.1 g/dL (ref 3.5–5.0)
Alkaline Phosphatase: 88 U/L (ref 38–126)
Anion gap: 15 (ref 5–15)
BUN: 17 mg/dL (ref 8–23)
CO2: 16 mmol/L — ABNORMAL LOW (ref 22–32)
Calcium: 9.9 mg/dL (ref 8.9–10.3)
Chloride: 103 mmol/L (ref 98–111)
Creatinine, Ser: 1.1 mg/dL — ABNORMAL HIGH (ref 0.44–1.00)
GFR, Estimated: 49 mL/min — ABNORMAL LOW (ref >60)
Glucose, Bld: 257 mg/dL — ABNORMAL HIGH (ref 70–99)
Potassium: 3.6 mmol/L (ref 3.5–5.1)
Sodium: 134 mmol/L — ABNORMAL LOW (ref 135–145)
Total Bilirubin: 0.8 mg/dL (ref <1.2)
Total Protein: 7.6 g/dL (ref 6.5–8.1)

## 2023-10-01 LAB — CBC
HCT: 43.6 % (ref 36.0–46.0)
Hemoglobin: 14.9 g/dL (ref 12.0–15.0)
MCH: 29.3 pg (ref 26.0–34.0)
MCHC: 34.2 g/dL (ref 30.0–36.0)
MCV: 85.7 fL (ref 80.0–100.0)
Platelets: 328 10*3/uL (ref 150–400)
RBC: 5.09 MIL/uL (ref 3.87–5.11)
RDW: 12.8 % (ref 11.5–15.5)
WBC: 16.3 10*3/uL — ABNORMAL HIGH (ref 4.0–10.5)
nRBC: 0 % (ref 0.0–0.2)

## 2023-10-01 LAB — HEMOGLOBIN A1C
Hgb A1c MFr Bld: 6.6 % — ABNORMAL HIGH (ref 4.8–5.6)
Mean Plasma Glucose: 142.72 mg/dL

## 2023-10-01 LAB — MAGNESIUM
Magnesium: 1.9 mg/dL (ref 1.7–2.4)
Magnesium: 1.8 mg/dL (ref 1.7–2.4)

## 2023-10-01 LAB — CBG MONITORING, ED: Glucose-Capillary: 182 mg/dL — ABNORMAL HIGH (ref 70–99)

## 2023-10-01 LAB — PHOSPHORUS: Phosphorus: 1.7 mg/dL — ABNORMAL LOW (ref 2.5–4.6)

## 2023-10-01 LAB — PROTIME-INR
INR: 1 (ref 0.8–1.2)
Prothrombin Time: 13.3 s (ref 11.4–15.2)

## 2023-10-01 LAB — TSH: TSH: 1.963 u[IU]/mL (ref 0.350–4.500)

## 2023-10-01 LAB — LIPASE, BLOOD: Lipase: 34 U/L (ref 11–51)

## 2023-10-01 LAB — MRSA NEXT GEN BY PCR, NASAL: MRSA by PCR Next Gen: NOT DETECTED

## 2023-10-01 MED ORDER — LEVOTHYROXINE SODIUM 25 MCG PO TABS
50.0000 ug | ORAL_TABLET | Freq: Every day | ORAL | Status: DC
  Administered 2023-10-01 – 2023-10-03 (×3): 50 ug via ORAL
  Filled 2023-10-01: qty 2
  Filled 2023-10-01: qty 1
  Filled 2023-10-01: qty 2

## 2023-10-01 MED ORDER — PANTOPRAZOLE SODIUM 40 MG PO TBEC
40.0000 mg | DELAYED_RELEASE_TABLET | Freq: Every day | ORAL | Status: DC
  Administered 2023-10-01 – 2023-10-03 (×3): 40 mg via ORAL
  Filled 2023-10-01 (×3): qty 1

## 2023-10-01 MED ORDER — CARVEDILOL 3.125 MG PO TABS
6.2500 mg | ORAL_TABLET | Freq: Two times a day (BID) | ORAL | Status: DC
  Administered 2023-10-01 – 2023-10-03 (×5): 6.25 mg via ORAL
  Filled 2023-10-01 (×5): qty 2

## 2023-10-01 MED ORDER — CHLORHEXIDINE GLUCONATE CLOTH 2 % EX PADS
6.0000 | MEDICATED_PAD | Freq: Every day | CUTANEOUS | Status: DC
  Administered 2023-10-01 – 2023-10-03 (×3): 6 via TOPICAL

## 2023-10-01 MED ORDER — CLONIDINE HCL 0.2 MG PO TABS
0.2000 mg | ORAL_TABLET | Freq: Three times a day (TID) | ORAL | Status: DC
Start: 2023-10-01 — End: 2023-10-03
  Administered 2023-10-01 – 2023-10-03 (×8): 0.2 mg via ORAL
  Filled 2023-10-01 (×8): qty 1

## 2023-10-01 MED ORDER — ENOXAPARIN SODIUM 40 MG/0.4ML IJ SOSY
40.0000 mg | PREFILLED_SYRINGE | INTRAMUSCULAR | Status: DC
  Administered 2023-10-01 – 2023-10-03 (×3): 40 mg via SUBCUTANEOUS
  Filled 2023-10-01 (×3): qty 0.4

## 2023-10-01 MED ORDER — INSULIN ASPART 100 UNIT/ML IJ SOLN: 0.0000 [IU] | Freq: Every day | INTRAMUSCULAR | Status: DC

## 2023-10-01 MED ORDER — HYDROMORPHONE HCL 1 MG/ML IJ SOLN: 0.5000 mg | INTRAMUSCULAR | Status: DC | PRN

## 2023-10-01 MED ORDER — ESCITALOPRAM OXALATE 10 MG PO TABS
5.0000 mg | ORAL_TABLET | Freq: Every day | ORAL | Status: DC
  Administered 2023-10-01 – 2023-10-03 (×3): 5 mg via ORAL
  Filled 2023-10-01 (×3): qty 1

## 2023-10-01 MED ORDER — ACETAMINOPHEN 650 MG RE SUPP: 650.0000 mg | Freq: Four times a day (QID) | RECTAL | Status: DC | PRN

## 2023-10-01 MED ORDER — SODIUM CHLORIDE 0.9 % IV SOLN
2.0000 g | Freq: Once | INTRAVENOUS | Status: AC
  Administered 2023-10-01: 2 g via INTRAVENOUS
  Filled 2023-10-01: qty 20

## 2023-10-01 MED ORDER — NICARDIPINE HCL IN NACL 20-0.86 MG/200ML-% IV SOLN
3.0000 mg/h | INTRAVENOUS | Status: DC
  Administered 2023-10-01: 5 mg/h via INTRAVENOUS
  Filled 2023-10-01: qty 200

## 2023-10-01 MED ORDER — LABETALOL HCL 5 MG/ML IV SOLN
10.0000 mg | Freq: Once | INTRAVENOUS | Status: AC
  Administered 2023-10-01: 10 mg via INTRAVENOUS
  Filled 2023-10-01: qty 4

## 2023-10-01 MED ORDER — LORAZEPAM 2 MG/ML IJ SOLN
1.0000 mg | Freq: Once | INTRAMUSCULAR | Status: AC
  Administered 2023-10-01: 1 mg via INTRAVENOUS
  Filled 2023-10-01: qty 1

## 2023-10-01 MED ORDER — ACETAMINOPHEN 325 MG PO TABS
650.0000 mg | ORAL_TABLET | Freq: Four times a day (QID) | ORAL | Status: DC | PRN
  Administered 2023-10-03: 650 mg via ORAL
  Filled 2023-10-01: qty 2

## 2023-10-01 MED ORDER — INSULIN ASPART 100 UNIT/ML IJ SOLN
0.0000 [IU] | Freq: Three times a day (TID) | INTRAMUSCULAR | Status: DC
  Administered 2023-10-01: 2 [IU] via SUBCUTANEOUS
  Administered 2023-10-02: 1 [IU] via SUBCUTANEOUS
  Administered 2023-10-03: 2 [IU] via SUBCUTANEOUS

## 2023-10-01 MED ORDER — GABAPENTIN 100 MG PO CAPS
100.0000 mg | ORAL_CAPSULE | Freq: Every day | ORAL | Status: DC
  Administered 2023-10-01 – 2023-10-03 (×3): 100 mg via ORAL
  Filled 2023-10-01 (×3): qty 1

## 2023-10-01 MED ORDER — PROCHLORPERAZINE EDISYLATE 10 MG/2ML IJ SOLN
10.0000 mg | Freq: Four times a day (QID) | INTRAMUSCULAR | Status: DC | PRN
  Administered 2023-10-01: 10 mg via INTRAVENOUS
  Filled 2023-10-01: qty 2

## 2023-10-01 MED ORDER — LABETALOL HCL 5 MG/ML IV SOLN
10.0000 mg | Freq: Once | INTRAVENOUS | Status: AC
  Administered 2023-10-01: 10 mg via INTRAVENOUS

## 2023-10-01 NOTE — H&P (Signed)
History and Physical    Patient: Sherry Waller XBJ:[id number] DOB: Mar 12, 1936 DOA: 10/01/2023 DOS: the patient was seen and examined on 10/01/2023 PCP: Assunta Found, MD  Patient coming from: Home  Chief Complaint:  Chief Complaint  Patient presents with   Hypertension   HPI: Sherry Waller is a 87 y.o. female with medical history significant of hypothyroidism, depression, hypertension, type 2 diabetes with chronic kidney disease stage III b and GERD; presented to the emergency department secondary to nausea/vomiting, increased frequency and not feeling well.  Symptoms present for the last 1-2 days and worsening.  On the day of admission patient reported being unable to take her medications for the last 36 hours and was found to be significantly hypertensive.  Patient reports no chest pain, no shortness of breath, no hematuria, no hematochezia/melena, no focal weaknesses or any other complaints.  Workup in the ED demonstrating the presence of UTI and hypertensive urgency.  As needed medications have been provided to help with stabilizing blood pressure and a Cardene drip has been initiated.  Mild troponin elevation has been seen; cultures taken and antibiotics started; TR[facility] contacted to place patient in the hospital for further evaluation and management.   Review of Systems: As mentioned in the history of present illness. All other systems reviewed and are negative.  Past Medical History:  Diagnosis Date   Hypertension    Thyroid disease    Past Surgical History:  Procedure Laterality Date   ABDOMINAL HYSTERECTOMY     CHOLECYSTECTOMY     TONSILLECTOMY     Social History:  reports that she has never smoked. She has never used smokeless tobacco. She reports that she does not drink alcohol and does not use drugs.  Allergies  Allergen Reactions   Fenofibrate Other (See Comments)    Aches   Statins Itching   Sulfa Antibiotics Other (See Comments)    headahces    Family  History  Problem Relation Age of Onset   Brain cancer Father    Ovarian cancer Sister    Brain cancer Sister     Prior to Admission medications   Medication Sig Start Date End Date Taking? Authorizing Provider  Alpha-Lipoic Acid 100 MG CAPS Take by mouth.    [provider]  carvedilol (COREG) 6.25 MG tablet Take 6.25 mg by mouth 2 (two) times daily.    [provider]  cloNIDine (CATAPRES - DOSED IN MG/24 HR) 0.2 mg/24hr patch Place 0.2 mg onto the skin once a week.    [provider]  cloNIDine (CATAPRES) 0.3 MG tablet Take 0.3 mg by mouth 3 (three) times daily.    [provider]  diltiazem (TIAZAC) 360 MG 24 hr capsule Take 360 mg by mouth daily.    [provider]  escitalopram (LEXAPRO) 10 MG tablet Take 10 mg by mouth daily. Take 1/2 a tablet by mouth once a day    [provider]  Fish Oil OIL by Does not apply route.    [provider]  furosemide (LASIX) 20 MG tablet SMARTSIG:0.5 Tablet(s) By Mouth Morning-Evening    [provider]  gabapentin (NEURONTIN) 100 MG capsule Take 100 mg by mouth daily.    [provider]  hydrALAZINE (APRESOLINE) 25 MG tablet Take 1 tablet (25 mg total) by mouth 3 (three) times daily. 10/25/18 01/23/19  Laqueta Linden, MD  ibuprofen (ADVIL,MOTRIN) 200 MG tablet Take 200 mg by mouth as needed.    [provider]  levothyroxine (SYNTHROID, LEVOTHROID) 50 MCG tablet Take 50 mcg by mouth daily before breakfast.    [provider]  losartan (COZAAR) 100 MG tablet Take 100 mg by mouth daily.    [provider]  metoprolol-hydrochlorothiazide (LOPRESSOR HCT) 100-25 MG per tablet Take 1 tablet by mouth daily. Takes 1/2 tablet BID    [provider]  NIFEdipine (ADALAT CC) 90 MG 24 hr tablet Take 90 mg by mouth daily. 07/21/23   [provider]  olmesartan (BENICAR) 40 MG tablet Take 40 mg by mouth daily.    [provider]   predniSONE (DELTASONE) 10 MG tablet Take 1 tablet (10 mg total) by mouth daily with breakfast. 06/04/20   Nadara Mustard, MD  traMADol (ULTRAM) 50 MG tablet Take 1 tablet (50 mg total) by mouth every 12 (twelve) hours as needed. 08/21/23   Abran Cantor, NP    Physical Exam: Vitals:   10/01/23 0530 10/01/23 0553 10/01/23 0600 10/01/23 0610  BP: (!) 221/97 (!) 197/92 (!) 189/74 (!) 199/89  Pulse: 100 (!) 110 89 90  Resp: (!) 24 20 (!) 25 (!) 27  Temp:      TempSrc:      SpO2: (!) 87% 93% 93% 92%  Weight:      Height:       General exam: Alert, awake, oriented x 3, no chest pain, no shortness of breath, still feeling nauseous but no further vomiting.  Patient is afebrile. Respiratory system: Clear to auscultation. Respiratory effort normal.  Good saturation on room air. Cardiovascular system:RRR.  No rubs, no gallops, Gastrointestinal system: Abdomen is nondistended, soft and mildly tender to palpation in her lower abdomen.  No guarding; positive bowel sounds. Central nervous system: No focal neurological deficits. Extremities: No cyanosis, clubbing or edema. Skin: No petechiae. Psychiatry: Judgement and insight appear normal. Mood & affect appropriate.   Data Reviewed: CBC: WBC 16.3, Hemoglobin 14.9 and platelet count 328K Comprehensive metabolic panel: Sodium 134, potassium 3.6, chloride 103 BUN 17, creatinine 1.10 LFTs and GFR 49. Lipase 34 Magnesium: 1.9 Urinalysis: Cloudy appearance, small hemoglobin urine dipstick, > 300 protein, large leukocyte esterase and many bacteria.   Assessment and Plan: 1-hypertensive urgency -Patient has been started on nicardipine drip -Will resume patient carvedilol, clonidine and use also as needed hydralazine. -Follow vital signs and assess clinical response.  2-UTI -Maintain adequate hydration -Follow culture results -Continue IV antibiotics.  3-elevated troponin -No chest pain or shortness of breath -EKG not demonstrating  acute ischemic changes -Case discussed with cardiology who recommended 2D echo. -Patient mild troponin elevation most likely demand ischemia due to hypertensive urgency.  4-nausea/vomiting -Most likely associated with UTI -Slowly Advance diet -Continue as needed antiemetics and the use of PPI.  5-chronic kidney disease stage IIIb -For the most part appears to be at baseline -Will minimize nephrotoxic agents -Treat UTI -Maintain adequate hydration -Follow renal function trend.  6-history of depression/neuropathy -Continue treatment with Lexapro and Neurontin  7-hypothyroidism -Continue Synthroid  8-type 2 diabetes with nephropathy/neuropathy -Holding oral hypoglycemic agents while inpatient -Continue treatment with a sliding scale insulin -Follow CBGs fluctuation and adjust management as required     Advance Care Planning:   Code Status: Full Code   Consults: Cardiology service curbside.  Family Communication: Son at bedside.  Severity of Illness: The appropriate patient status for this patient is OBSERVATION. Observation status is judged to be reasonable and necessary in order to provide the required intensity of service to ensure the patient's safety.  The patient's presenting symptoms, physical exam findings, and initial radiographic and laboratory data in the context of their medical condition is felt to place them at decreased risk for further clinical deterioration. Furthermore, it is anticipated that the patient will be medically stable for discharge from the hospital within 2 midnights of admission.   Author: Vassie Loll, MD 10/01/2023 8:19 AM  For on call review [email or web address].

## 2023-10-01 NOTE — ED Provider Notes (Signed)
[facility] Emergency Department Provider Note MRN:  [id number]  Arrival date & time: 10/01/23     Chief Complaint   Hypertension   History of Present Illness   Sherry Waller is a 87 y.o. year-old female with a history of hypertension presenting to the ED with chief complaint of hypertension.  General malaise, just feels really bad since yesterday.  Associated with nausea vomiting.  Denies headache, no chest pain or shortness of breath, no abdominal pain, no numbness or weakness to the arms or legs.  Review of Systems  A thorough review of systems was obtained and all systems are negative except as noted in the HPI and PMH.   Patient's Health History    Past Medical History:  Diagnosis Date   Hypertension    Thyroid disease     Past Surgical History:  Procedure Laterality Date   ABDOMINAL HYSTERECTOMY     CHOLECYSTECTOMY     TONSILLECTOMY      Family History  Problem Relation Age of Onset   Brain cancer Father    Ovarian cancer Sister    Brain cancer Sister     Social History   Socioeconomic History   Marital status: Married    Spouse name: Not on file   Number of children: 3   Years of education: 12+   Highest education level: Not on file  Occupational History   Not on file  Tobacco Use   Smoking status: Never   Smokeless tobacco: Never  Vaping Use   Vaping status: Never Used  Substance and Sexual Activity   Alcohol use: No   Drug use: No   Sexual activity: Yes    Birth control/protection: Surgical  Other Topics Concern   Not on file  Social History Narrative   Drinks 1 cup of coffee a day    Social Drivers of Corporate investment banker Strain: Not on file  Food Insecurity: Not on file  Transportation Needs: Not on file  Physical Activity: Not on file  Stress: Not on file  Social Connections: Not on file  Intimate Partner Violence: Not on file     Physical Exam   Vitals:   10/01/23 0600 10/01/23 0610  BP: (!)  189/74 (!) 199/89  Pulse: 89 90  Resp: (!) 25 (!) 27  Temp:    SpO2: 93% 92%    CONSTITUTIONAL: Well-appearing, anxious appearing NEURO/PSYCH:  Alert and oriented x 3, no focal deficits EYES:  eyes equal and reactive ENT/NECK:  no LAD, no JVD CARDIO: Tachycardic rate, well-perfused, normal S1 and S2 PULM:  CTAB no wheezing or rhonchi GI/GU:  non-distended, non-tender MSK/SPINE:  No gross deformities, no edema SKIN:  no rash, atraumatic   *Additional and/or pertinent findings included in MDM below  Diagnostic and Interventional Summary    EKG Interpretation Date/Time:  Friday October 01 2023 04:38:41 EST Ventricular Rate:  114 PR Interval:  205 QRS Duration:  87 QT Interval:  318 QTC Calculation: 438 R Axis:   4  Text Interpretation: Sinus tachycardia Biatrial enlargement Anterior infarct, old Confirmed by Kennis Carina [id number] on 10/01/2023 5:11:30 AM       Labs Reviewed  CBC - Abnormal; Notable for the following components:      Result Value   WBC 16.3 (*)    All other components within normal limits  COMPREHENSIVE METABOLIC PANEL - Abnormal; Notable for the following components:   Sodium 134 (*)    CO2 16 (*)  Glucose, Bld 257 (*)    Creatinine, Ser 1.10 (*)    GFR, Estimated 49 (*)    All other components within normal limits  URINALYSIS, ROUTINE W REFLEX MICROSCOPIC - Abnormal; Notable for the following components:   APPearance CLOUDY (*)    Hgb urine dipstick SMALL (*)    Protein, ur >=300 (*)    Leukocytes,Ua LARGE (*)    Bacteria, UA MANY (*)    All other components within normal limits  TROPONIN I (HIGH SENSITIVITY) - Abnormal; Notable for the following components:   Troponin I (High Sensitivity) 130 (*)    All other components within normal limits  PROTIME-INR  LIPASE, BLOOD  MAGNESIUM  TROPONIN I (HIGH SENSITIVITY)    CT HEAD WO CONTRAST ( )  Final Result    DG Chest Port 1 View  Final Result      Medications  nicardipine (CARDENE)  20mg  in 0.86% saline IV infusion (0.1 mg/ml) (has no administration in time range)  cefTRIAXone (ROCEPHIN) 2 g in sodium chloride 0.9 % 100 mL IVPB (has no administration in time range)  LORazepam (ATIVAN) injection 1 mg (1 mg Intravenous Given 10/01/23 0501)  labetalol (NORMODYNE) injection 10 mg (10 mg Intravenous Given 10/01/23 0501)  labetalol (NORMODYNE) injection 10 mg (10 mg Intravenous Given 10/01/23 0553)     Procedures  /  Critical Care .Critical Care  Performed by: Sabas Sous, MD Authorized by: Sabas Sous, MD   Critical care provider statement:    Critical care time (minutes):  45   Critical care was necessary to treat or prevent imminent or life-threatening deterioration of the following conditions: hypertensive crisis.   Critical care was time spent personally by me on the following activities:  Development of treatment plan with patient or surrogate, discussions with consultants, evaluation of patient's response to treatment, examination of patient, ordering and review of laboratory studies, ordering and review of radiographic studies, ordering and performing treatments and interventions, pulse oximetry, re-evaluation of patient's condition and review of old charts   ED Course and Medical Decision Making  Initial Impression and Ddx Patient arrives tachycardic, severely hypertensive, seems very anxious, question anxiety or panic attack versus hypertensive urgency/crisis.  No neurological deficits, nausea but no headache, no chest pain, awaiting laboratory assessment, will reassess.  Past medical/surgical history that increases complexity of ED encounter: Hypertension  Interpretation of Diagnostics I personally reviewed the EKG and my interpretation is as follows: Sinus rhythm with nonspecific findings  Labs reveal leukocytosis, elevated troponin, evidence of UTI  Patient Reassessment and Ultimate Disposition/Management     Persistent elevated blood  pressure despite labetalol x 2.  Patient explains that her baseline blood pressure at home is 180.  Starting nicardipine drip with goal SBP of 180-190 to obtain a 20% decrease from her arrival blood pressure of 240.  Plan is for hospitalist admission.  Patient management required discussion with the following services or consulting groups:  Hospitalist Service  Complexity of Problems Addressed Acute illness or injury that poses threat of life of bodily function  Additional Data Reviewed and Analyzed Further history obtained from: Further history from spouse/family member  Additional Factors Impacting ED Encounter Risk Consideration of hospitalization  Elmer Sow. Pilar Plate, MD [facility] Emergency Medicine [facility] [email or web address]  Final Clinical Impressions(s) / ED Diagnoses     ICD-10-CM   1. Hypertensive crisis  I16.9       ED Discharge Orders     None  Discharge Instructions Discussed with and Provided to Patient:   Discharge Instructions   None      Sabas Sous, MD 10/01/23 [phone]

## 2023-10-01 NOTE — ED Notes (Signed)
Date and time results received: 10/01/23 8:03 AM  (use smartphrase ".now" to insert current time)  Test: trop Critical Value: 210  Name of Provider Notified: Dr. Gwenlyn Perking  Orders Received? Or Actions Taken?:

## 2023-10-01 NOTE — Progress Notes (Signed)
   10/01/23 1312  TOC Brief Assessment  Insurance and Status Reviewed  Patient has primary care physician Yes  Home environment has been reviewed Home  Prior level of function: Relatives to assist  Prior/Current Home Services No current home services  Social Drivers of Health Review SDOH reviewed no interventions necessary  Readmission risk has been reviewed Yes  Transition of care needs no transition of care needs at this time   Transition of Care Department [facility]) has reviewed patient and no TOC needs have been identified at this time. We will continue to monitor patient advancement through interdisciplinary progression rounds. If new patient transition needs arise, please place a TOC consult.

## 2023-10-01 NOTE — Progress Notes (Signed)
Discussed with Dr. Gwenlyn Perking. Goal rate for SBP is still 180.

## 2023-10-01 NOTE — ED Notes (Signed)
ED TO INPATIENT HANDOFF REPORT  ED Nurse Name and Phone #: Wandra Mannan, Paramedic  S Name/Age/Gender Sherry Waller 87 y.o. female Room/Bed: APA11/APA11  Code Status   Code Status: Full Code  Home/SNF/Other Home Patient oriented to: self, place, time, and situation Is this baseline? Yes   Triage Complete: Triage complete  Chief Complaint Hypertensive urgency [I16.0]  Triage Note [facility] for home. Cc of not feeling well . Thought she was allergic to her increased bp meds. Started feeling bad yesterday.  Emesis, can't sleep Restless in triage 18g r ac placed by ems . Zofran given.     Allergies Allergies  Allergen Reactions   Fenofibrate Other (See Comments)    Aches   Statins Itching   Sulfa Antibiotics Other (See Comments)    headahces    Level of Care/Admitting Diagnosis ED Disposition     ED Disposition  Admit   Condition  --   Comment  Hospital Area: [facility] [[id number]]  Level of Care: Stepdown [14]  Covid Evaluation: Asymptomatic - no recent exposure (last 10 days) testing not required  Diagnosis: Hypertensive urgency [650126]  Admitting Physician: Frankey Shown [[id number]]  Attending Physician: Frankey Shown [[id number]]          B Medical/Surgery History Past Medical History:  Diagnosis Date   Hypertension    Thyroid disease    Past Surgical History:  Procedure Laterality Date   ABDOMINAL HYSTERECTOMY     CHOLECYSTECTOMY     TONSILLECTOMY       A IV Location/Drains/Wounds Patient Lines/Drains/Airways Status     Active Line/Drains/Airways     Name Placement date Placement time Site Days   Peripheral IV 10/01/23 18 G Right Antecubital 10/01/23  0425  Antecubital  less than 1            Intake/Output Last 24 hours  Intake/Output Summary (Last 24 hours) at 10/01/2023 1331 Last data filed at 10/01/2023 0272 Gross per 24 hour  Intake 100 ml  Output --  Net 100 ml    Labs/Imaging Results for orders placed  or performed during the hospital encounter of 10/01/23 (from the past 48 hours)  CBC     Status: Abnormal   Collection Time: 10/01/23  4:54 AM  Result Value Ref Range   WBC 16.3 (H) 4.0 - 10.5 K/uL   RBC 5.09 3.87 - 5.11 MIL/uL   Hemoglobin 14.9 12.0 - 15.0 g/dL   HCT 53.6 64.4 - 03.4 %   MCV 85.7 80.0 - 100.0 fL   MCH 29.3 26.0 - 34.0 pg   MCHC 34.2 30.0 - 36.0 g/dL   RDW 74.2 59.5 - 63.8 %   Platelets 328 150 - 400 K/uL   nRBC 0.0 0.0 - 0.2 %    Comment: Performed at [facility], [address] [place], [place] [address]  Comprehensive metabolic panel     Status: Abnormal   Collection Time: 10/01/23  4:54 AM  Result Value Ref Range   Sodium 134 (L) 135 - 145 mmol/L   Potassium 3.6 3.5 - 5.1 mmol/L   Chloride 103 98 - 111 mmol/L   CO2 16 (L) 22 - 32 mmol/L   Glucose, Bld 257 (H) 70 - 99 mg/dL    Comment: Glucose reference range applies only to samples taken after fasting for at least 8 hours.   BUN 17 8 - 23 mg/dL   Creatinine, Ser 3.29 (H) 0.44 - 1.00 mg/dL   Calcium 9.9 8.9 - 51.8 mg/dL  Total Protein 7.6 6.5 - 8.1 g/dL   Albumin 4.1 3.5 - 5.0 g/dL   AST 32 15 - 41 U/L   ALT 20 0 - 44 U/L   Alkaline Phosphatase 88 38 - 126 U/L   Total Bilirubin 0.8 <1.2 mg/dL   GFR, Estimated 49 (L) >60 mL/min    Comment: (NOTE) Calculated using the CKD-EPI Creatinine Equation (2021)    Anion gap 15 5 - 15    Comment: Performed at [facility], [address], [place], [place] [address]  Troponin I (High Sensitivity)     Status: Abnormal   Collection Time: 10/01/23  4:54 AM  Result Value Ref Range   Troponin I (High Sensitivity) 130 (HH) <18 ng/L    Comment: CRITICAL RESULT CALLED TO, READ BACK BY AND VERIFIED WITH ZOHBI,D AT 5:35AM ON 10/01/23 BY FESTERMAN,C (NOTE) Elevated high sensitivity troponin I (hsTnI) values and significant  changes across serial measurements may suggest ACS but many other  chronic and acute conditions are known to elevate hsTnI results.  Refer to the  "Links" section for chest pain algorithms and additional  guidance. Performed at [facility], [address] [place], [place] [address]   Protime-INR     Status: None   Collection Time: 10/01/23  4:54 AM  Result Value Ref Range   Prothrombin Time 13.3 11.4 - 15.2 seconds   INR 1.0 0.8 - 1.2    Comment: (NOTE) INR goal varies based on device and disease states. Performed at [facility], [address] [place], [place] [address]   Lipase, blood     Status: None   Collection Time: 10/01/23  4:54 AM  Result Value Ref Range   Lipase 34 11 - 51 U/L    Comment: Performed at [facility], [address] [place], [place] [address]  Urinalysis, Routine w reflex microscopic -Urine, Clean Catch     Status: Abnormal   Collection Time: 10/01/23  4:54 AM  Result Value Ref Range   Color, Urine YELLOW YELLOW   APPearance CLOUDY (A) CLEAR   Specific Gravity, Urine 1.012 1.005 - 1.030   pH 6.0 5.0 - 8.0   Glucose, UA NEGATIVE NEGATIVE mg/dL   Hgb urine dipstick SMALL (A) NEGATIVE   Bilirubin Urine NEGATIVE NEGATIVE   Ketones, ur NEGATIVE NEGATIVE mg/dL   Protein, ur >=188 (A) NEGATIVE mg/dL   Nitrite NEGATIVE NEGATIVE   Leukocytes,Ua LARGE (A) NEGATIVE   RBC / HPF 21-50 0 - 5 RBC/hpf   WBC, UA >50 0 - 5 WBC/hpf   Bacteria, UA MANY (A) NONE SEEN   Squamous Epithelial / HPF 0-5 0 - 5 /HPF   WBC Clumps PRESENT    Budding Yeast PRESENT     Comment: Performed at [facility], [address], [place], [place] [address]  Magnesium     Status: None   Collection Time: 10/01/23  4:54 AM  Result Value Ref Range   Magnesium 1.9 1.7 - 2.4 mg/dL    Comment: Performed at [facility], [address], [place], [place] [address]  Magnesium     Status: None   Collection Time: 10/01/23  4:54 AM  Result Value Ref Range   Magnesium 1.8 1.7 - 2.4 mg/dL    Comment: Performed at [facility], [address], [place], [place] [address]  Phosphorus     Status: Abnormal   Collection Time: 10/01/23  4:54 AM   Result Value Ref Range   Phosphorus 1.7 (L) 2.5 - 4.6 mg/dL  Comment: Performed at [facility], [address] [place], [place] [address]  TSH     Status: None   Collection Time: 10/01/23  4:54 AM  Result Value Ref Range   TSH 1.963 0.350 - 4.500 uIU/mL    Comment: Performed by a 3rd Generation assay with a functional sensitivity of <=0.01 uIU/mL. Performed at [facility], [address] [place], [place] [address]   Hemoglobin A1c     Status: Abnormal   Collection Time: 10/01/23  4:54 AM  Result Value Ref Range   Hgb A1c MFr Bld 6.6 (H) 4.8 - 5.6 %    Comment: (NOTE) Pre diabetes:          5.7%-6.4%  Diabetes:              >6.4%  Glycemic control for   <7.0% adults with diabetes    Mean Plasma Glucose 142.72 mg/dL    Comment: Performed at [facility], [address], [place], [place] [address]  Troponin I (High Sensitivity)     Status: Abnormal   Collection Time: 10/01/23  6:38 AM  Result Value Ref Range   Troponin I (High Sensitivity) 210 (HH) <18 ng/L    Comment: CRITICAL RESULT CALLED TO, READ BACK BY AND VERIFIED WITH DOSS,M AT 7:20AM ON 10/01/23 BY FESTERMAN,C (NOTE) Elevated high sensitivity troponin I (hsTnI) values and significant  changes across serial measurements may suggest ACS but many other  chronic and acute conditions are known to elevate hsTnI results.  Refer to the "Links" section for chest pain algorithms and additional  guidance. Performed at [facility], [address] [place], [place] [address]    CT HEAD WO CONTRAST ( ) Result Date: 10/01/2023 CLINICAL DATA:  Headache, new onset. EXAM: CT HEAD WITHOUT CONTRAST TECHNIQUE: Contiguous axial images were obtained from the base of the skull through the vertex without intravenous contrast. RADIATION DOSE REDUCTION: This exam was performed according to the departmental dose-optimization program which includes automated exposure control, adjustment of the mA and/or kV according to patient size  and/or use of iterative reconstruction technique. COMPARISON:  None Available. FINDINGS: Brain: No evidence of acute infarction, hemorrhage, hydrocephalus, extra-axial collection or mass lesion/mass effect. Preserved brain volume for age with minor white matter low-density. Intermittent superimposed streak artifact. Vascular: No hyperdense vessel or unexpected calcification. Skull: Normal. Negative for fracture or focal lesion. Sinuses/Orbits: Negative.  No sinusitis. IMPRESSION: Unremarkable head CT for age. Electronically Signed   By: Tiburcio Pea M.D.   On: 10/01/2023 06:27   DG Chest Port 1 View Result Date: 10/01/2023 CLINICAL DATA:  Tachycardia and inability to rest.  Nausea EXAM: PORTABLE CHEST 1 VIEW COMPARISON:  08/13/2022 FINDINGS: Artifact from EKG leads. Normal heart size and mediastinal contours. No acute infiltrate or edema. No effusion or pneumothorax. No acute osseous findings. Generalized thoracic endplate spurring with mild scoliosis. IMPRESSION: No evidence of active disease. Electronically Signed   By: Tiburcio Pea M.D.   On: 10/01/2023 05:37    Pending Labs Unresulted Labs (From admission, onward)     Start     Ordered   10/08/23 0500  Creatinine, serum  (enoxaparin (LOVENOX)    CrCl >/= 30 ml/min)  Weekly,   R     Comments: while on enoxaparin therapy    10/01/23 0800   10/02/23 0500  Basic metabolic panel  Tomorrow morning,   R        10/01/23 0800   10/02/23 0500  CBC  Tomorrow morning,  R        10/01/23 0800   10/01/23 0808  Urine Culture (for pregnant, neutropenic or urologic patients or patients with an indwelling urinary catheter)  (Urine Labs)  Add-on,   AD       Question Answer Comment  Indication Dysuria   Patient immune status Normal      10/01/23 0807            Vitals/Pain Today's Vitals   10/01/23 0827 10/01/23 0830 10/01/23 1000 10/01/23 1315  BP:  (!) 208/79 (!) 206/68 (!) 192/74  Pulse:  94  (!) 108  Resp:  (!) 26 (!) 30 (!) 24   Temp: 98 F (36.7 C)     TempSrc: Oral     SpO2:  93%  99%  Weight:      Height:      PainSc:        Isolation Precautions No active isolations  Medications Medications  nicardipine (CARDENE) 20mg  in 0.86% saline IV infusion (0.1 mg/ml) (5 mg/hr Intravenous New Bag/Given 10/01/23 0817)  prochlorperazine (COMPAZINE) injection 10 mg (10 mg Intravenous Given 10/01/23 0805)  enoxaparin (LOVENOX) injection 40 mg (40 mg Subcutaneous Given 10/01/23 1254)  acetaminophen (TYLENOL) tablet 650 mg (has no administration in time range)    Or  acetaminophen (TYLENOL) suppository 650 mg (has no administration in time range)  HYDROmorphone (DILAUDID) injection 0.5-1 mg (has no administration in time range)  escitalopram (LEXAPRO) tablet 5 mg (5 mg Oral Given 10/01/23 1251)  cloNIDine (CATAPRES) tablet 0.2 mg (0.2 mg Oral Given 10/01/23 1251)  carvedilol (COREG) tablet 6.25 mg (6.25 mg Oral Given 10/01/23 1251)  gabapentin (NEURONTIN) capsule 100 mg (100 mg Oral Given 10/01/23 1251)  levothyroxine (SYNTHROID) tablet 50 mcg (50 mcg Oral Given 10/01/23 0824)  insulin aspart (novoLOG) injection 0-9 Units (has no administration in time range)  insulin aspart (novoLOG) injection 0-5 Units (has no administration in time range)  pantoprazole (PROTONIX) EC tablet 40 mg (40 mg Oral Given 10/01/23 1251)  LORazepam (ATIVAN) injection 1 mg (1 mg Intravenous Given 10/01/23 0501)  labetalol (NORMODYNE) injection 10 mg (10 mg Intravenous Given 10/01/23 0501)  labetalol (NORMODYNE) injection 10 mg (10 mg Intravenous Given 10/01/23 0553)  cefTRIAXone (ROCEPHIN) 2 g in sodium chloride 0.9 % 100 mL IVPB (0 g Intravenous Stopped 10/01/23 0832)    Mobility walks     Focused Assessments Cardiac Assessment Handoff:  Cardiac Rhythm: Sinus tachycardia No results found for: "CKTOTAL", "CKMB", "CKMBINDEX", "TROPONINI" No results found for: "DDIMER" Does the Patient currently have chest pain? Yes     R Recommendations: See Admitting Provider Note  Report given to:   Additional Notes: IV in left arm present due to constant patient movement causing occlusion of IV in right arm. Right arm IV is patent.

## 2023-10-01 NOTE — ED Triage Notes (Signed)
Rcems for home. Cc of not feeling well . Thought she was allergic to her increased bp meds. Started feeling bad yesterday.  Emesis, can't sleep Restless in triage 18g r ac placed by ems . Zofran given.

## 2023-10-01 NOTE — ED Notes (Signed)
Notified Dr. Pilar Plate of patient's troponin level of 130.

## 2023-10-01 NOTE — ED Notes (Signed)
ED Provider at bedside. 

## 2023-10-02 ENCOUNTER — Other Ambulatory Visit (HOSPITAL_COMMUNITY): Payer: Self-pay | Admitting: *Deleted

## 2023-10-02 ENCOUNTER — Observation Stay (HOSPITAL_COMMUNITY): Payer: Medicare HMO

## 2023-10-02 DIAGNOSIS — I2489 Other forms of acute ischemic heart disease: Secondary | ICD-10-CM | POA: Diagnosis present

## 2023-10-02 DIAGNOSIS — D72829 Elevated white blood cell count, unspecified: Secondary | ICD-10-CM | POA: Diagnosis present

## 2023-10-02 DIAGNOSIS — F32A Depression, unspecified: Secondary | ICD-10-CM | POA: Diagnosis present

## 2023-10-02 DIAGNOSIS — K219 Gastro-esophageal reflux disease without esophagitis: Secondary | ICD-10-CM | POA: Diagnosis not present

## 2023-10-02 DIAGNOSIS — E1122 Type 2 diabetes mellitus with diabetic chronic kidney disease: Secondary | ICD-10-CM | POA: Diagnosis present

## 2023-10-02 DIAGNOSIS — Z79899 Other long term (current) drug therapy: Secondary | ICD-10-CM | POA: Diagnosis not present

## 2023-10-02 DIAGNOSIS — Z79890 Hormone replacement therapy (postmenopausal): Secondary | ICD-10-CM | POA: Diagnosis not present

## 2023-10-02 DIAGNOSIS — N390 Urinary tract infection, site not specified: Secondary | ICD-10-CM | POA: Diagnosis present

## 2023-10-02 DIAGNOSIS — I160 Hypertensive urgency: Secondary | ICD-10-CM

## 2023-10-02 DIAGNOSIS — Z7952 Long term (current) use of systemic steroids: Secondary | ICD-10-CM | POA: Diagnosis not present

## 2023-10-02 DIAGNOSIS — N1832 Chronic kidney disease, stage 3b: Secondary | ICD-10-CM | POA: Diagnosis present

## 2023-10-02 DIAGNOSIS — I169 Hypertensive crisis, unspecified: Secondary | ICD-10-CM | POA: Diagnosis present

## 2023-10-02 DIAGNOSIS — Z90710 Acquired absence of both cervix and uterus: Secondary | ICD-10-CM | POA: Diagnosis not present

## 2023-10-02 DIAGNOSIS — R7989 Other specified abnormal findings of blood chemistry: Secondary | ICD-10-CM | POA: Diagnosis not present

## 2023-10-02 DIAGNOSIS — Z888 Allergy status to other drugs, medicaments and biological substances status: Secondary | ICD-10-CM | POA: Diagnosis not present

## 2023-10-02 DIAGNOSIS — Z882 Allergy status to sulfonamides status: Secondary | ICD-10-CM | POA: Diagnosis not present

## 2023-10-02 DIAGNOSIS — I129 Hypertensive chronic kidney disease with stage 1 through stage 4 chronic kidney disease, or unspecified chronic kidney disease: Secondary | ICD-10-CM | POA: Diagnosis present

## 2023-10-02 DIAGNOSIS — E039 Hypothyroidism, unspecified: Secondary | ICD-10-CM | POA: Diagnosis present

## 2023-10-02 DIAGNOSIS — Z1611 Resistance to penicillins: Secondary | ICD-10-CM | POA: Diagnosis present

## 2023-10-02 DIAGNOSIS — G629 Polyneuropathy, unspecified: Secondary | ICD-10-CM | POA: Diagnosis present

## 2023-10-02 LAB — CBC
HCT: 40.9 % (ref 36.0–46.0)
Hemoglobin: 13.1 g/dL (ref 12.0–15.0)
MCH: 28.5 pg (ref 26.0–34.0)
MCHC: 32 g/dL (ref 30.0–36.0)
MCV: 89.1 fL (ref 80.0–100.0)
Platelets: 250 10*3/uL (ref 150–400)
RBC: 4.59 MIL/uL (ref 3.87–5.11)
RDW: 13 % (ref 11.5–15.5)
WBC: 12.6 10*3/uL — ABNORMAL HIGH (ref 4.0–10.5)
nRBC: 0 % (ref 0.0–0.2)

## 2023-10-02 LAB — ECHOCARDIOGRAM COMPLETE
Area-P 1/2: 2.83 cm2
Height: 65 in
MV M vel: 5.9 m/s
MV Peak grad: 139.2 mm[Hg]
Radius: 0.4 cm
S' Lateral: 2.7 cm
Weight: 2359.8 [oz_av]

## 2023-10-02 LAB — GLUCOSE, CAPILLARY
Glucose-Capillary: 137 mg/dL — ABNORMAL HIGH (ref 70–99)
Glucose-Capillary: 99 mg/dL (ref 70–99)
Glucose-Capillary: 120 mg/dL — ABNORMAL HIGH (ref 70–99)
Glucose-Capillary: 116 mg/dL — ABNORMAL HIGH (ref 70–99)

## 2023-10-02 LAB — BASIC METABOLIC PANEL
Anion gap: 11 (ref 5–15)
BUN: 23 mg/dL (ref 8–23)
CO2: 22 mmol/L (ref 22–32)
Calcium: 9.2 mg/dL (ref 8.9–10.3)
Chloride: 101 mmol/L (ref 98–111)
Creatinine, Ser: 1.33 mg/dL — ABNORMAL HIGH (ref 0.44–1.00)
GFR, Estimated: 39 mL/min — ABNORMAL LOW (ref >60)
Glucose, Bld: 139 mg/dL — ABNORMAL HIGH (ref 70–99)
Potassium: 3.8 mmol/L (ref 3.5–5.1)
Sodium: 134 mmol/L — ABNORMAL LOW (ref 135–145)

## 2023-10-02 MED ORDER — MELATONIN 3 MG PO TABS
6.0000 mg | ORAL_TABLET | Freq: Every evening | ORAL | Status: DC | PRN
  Administered 2023-10-02: 6 mg via ORAL
  Filled 2023-10-02: qty 2

## 2023-10-02 MED ORDER — DILTIAZEM HCL ER COATED BEADS 240 MG PO CP24
240.0000 mg | ORAL_CAPSULE | Freq: Every day | ORAL | Status: DC
  Administered 2023-10-02 – 2023-10-03 (×2): 240 mg via ORAL
  Filled 2023-10-02 (×2): qty 1

## 2023-10-02 MED ORDER — SODIUM CHLORIDE 0.9 % IV SOLN
1.0000 g | INTRAVENOUS | Status: DC
  Administered 2023-10-02 – 2023-10-03 (×2): 1 g via INTRAVENOUS
  Filled 2023-10-02 (×2): qty 10

## 2023-10-02 MED ORDER — ISOSORBIDE MONONITRATE ER 60 MG PO TB24
60.0000 mg | ORAL_TABLET | Freq: Every day | ORAL | Status: DC
  Administered 2023-10-02 – 2023-10-03 (×2): 60 mg via ORAL
  Filled 2023-10-02 (×2): qty 1

## 2023-10-02 MED ORDER — ORAL CARE MOUTH RINSE: 15.0000 mL | OROMUCOSAL | Status: DC | PRN

## 2023-10-02 MED ORDER — HYDRALAZINE HCL 20 MG/ML IJ SOLN
10.0000 mg | Freq: Four times a day (QID) | INTRAMUSCULAR | Status: DC | PRN
Start: 2023-10-02 — End: 2023-10-03
  Administered 2023-10-02 – 2023-10-03 (×2): 10 mg via INTRAVENOUS
  Filled 2023-10-02 (×2): qty 1

## 2023-10-02 NOTE — Progress Notes (Signed)
Patient on bed pan at present.    Sherry Waller, RCS

## 2023-10-02 NOTE — Progress Notes (Signed)
Progress Note   Patient: Sherry Waller UJW:[id number] DOB: [birth year] DOA: 10/01/2023     0 DOS: the patient was seen and examined on 10/02/2023   Brief hospital admission course: Sherry Waller is a 87 y.o. female with medical history significant of hypothyroidism, depression, hypertension, type 2 diabetes with chronic kidney disease stage III b and GERD; presented to the emergency department secondary to nausea/vomiting, increased frequency and not feeling well.  Symptoms present for the last 1-2 days and worsening.  On the day of admission patient reported being unable to take her medications for the last 36 hours and was found to be significantly hypertensive.  Patient reports no chest pain, no shortness of breath, no hematuria, no hematochezia/melena, no focal weaknesses or any other complaints.   Workup in the ED demonstrating the presence of UTI and hypertensive urgency.  As needed medications have been provided to help with stabilizing blood pressure and a Cardene drip has been initiated.   Mild troponin elevation has been seen; cultures taken and antibiotics started; TRH contacted to place patient in the hospital for further evaluation and management.  Assessment and Plan: 1-hypertensive urgency -Patient has been weaned off nicardipine drip and -Continue carvedilol, clonidine, as needed hydralazine and resume extended release Cardizem. -Low-sodium diet discussed with patient. -Continue to follow vital signs -Continue slowly resuming home antihypertensive agents.  2-elevated troponin -In the setting of demand ischemia -No chest pain or shortness of breath -No ischemic changes on telemetry or EKG -Follow 2D echo.  3-UTI -Patient is present no nausea or vomiting -Continue to maintain adequate hydration (orally) -Follow culture results -Continue current antibiotics.  4-nausea/vomiting -Most likely associated with UTI -Continue PPI and antiemetics -Continue advancing  diet.  5-chronic kidney disease stage IIIb -Overall appears to be stable and at baseline -Maintain adequate hydration -Minimize nephrotoxic agents -Follow renal function trend.  6-hypothyroidism -Continue Synthroid.  7-history of depression/neuropathy -Continue treatment with Lexapro and Neurontin -No suicidal ideation or hallucinations.  8-type 2 diabetes with nephropathy/neuropathy -Continue to follow CBGs fluctuation and adjust hypoglycemic regimen as needed -Will continue holding oral hypoglycemic agents while inpatient -Continue sliding scale insulin.   Subjective:  Patient reports feeling better, no having any nausea/vomiting or significant abdominal discomfort.  Off nicardipine drip.   Physical Exam: Vitals:   10/02/23 1200 10/02/23 1300 10/02/23 1408 10/02/23 1432  BP: (!) 170/148 (!) 175/91 (!) 194/44 (!) 198/63  Pulse: 92 75 65 68  Resp: 20 14 17 17   Temp: 98.1 F (36.7 C)     TempSrc: Oral     SpO2: 97% 98% 97% 98%  Weight:      Height:       General exam: Alert, awake, oriented x 3; no chest pain, no shortness of breath. Respiratory system: Clear to auscultation. Respiratory effort normal.  Good saturation on room air. Cardiovascular system:RRR. No rubs or gallops; no JVD. Gastrointestinal system: Abdomen is nondistended, soft and nontender. No organomegaly or masses felt. Normal bowel sounds heard. Central nervous system:  No focal neurological deficits. Extremities: No cyanosis or clubbing; no edema. Skin: No petechiae. Psychiatry: Judgement and insight appear normal. Mood & affect appropriate.   Data Reviewed: CBC: WBCs 12.6, hemoglobin 13.1 and platelet count 250K Basic metabolic panel: Sodium 134, potassium 3.8, chloride 101, bicarb 22, glucose 139, BUN 23, creatinine 1.3, GFR 39.  Family Communication: Son at bedside  Disposition: Status is: Inpatient; continue treatment for UTI and uncontrolled blood pressure.   Planned Discharge Destination:  Home  Time  spent: 50 minutes  Author: Vassie Loll, MD 10/02/2023 2:55 PM  For on call review [email or web address].

## 2023-10-03 DIAGNOSIS — K219 Gastro-esophageal reflux disease without esophagitis: Secondary | ICD-10-CM

## 2023-10-03 DIAGNOSIS — I160 Hypertensive urgency: Secondary | ICD-10-CM | POA: Diagnosis not present

## 2023-10-03 DIAGNOSIS — N390 Urinary tract infection, site not specified: Secondary | ICD-10-CM | POA: Diagnosis not present

## 2023-10-03 DIAGNOSIS — N1832 Chronic kidney disease, stage 3b: Secondary | ICD-10-CM | POA: Diagnosis not present

## 2023-10-03 LAB — URINE CULTURE
Culture: >=100000 — AB
Special Requests: NORMAL

## 2023-10-03 LAB — BASIC METABOLIC PANEL
Anion gap: 13 (ref 5–15)
BUN: 29 mg/dL — ABNORMAL HIGH (ref 8–23)
CO2: 17 mmol/L — ABNORMAL LOW (ref 22–32)
Calcium: 9.5 mg/dL (ref 8.9–10.3)
Chloride: 104 mmol/L (ref 98–111)
Creatinine, Ser: 1.28 mg/dL — ABNORMAL HIGH (ref 0.44–1.00)
GFR, Estimated: 41 mL/min — ABNORMAL LOW (ref >60)
Glucose, Bld: 129 mg/dL — ABNORMAL HIGH (ref 70–99)
Potassium: 3.7 mmol/L (ref 3.5–5.1)
Sodium: 134 mmol/L — ABNORMAL LOW (ref 135–145)

## 2023-10-03 LAB — MAGNESIUM: Magnesium: 2.1 mg/dL (ref 1.7–2.4)

## 2023-10-03 LAB — GLUCOSE, CAPILLARY
Glucose-Capillary: 153 mg/dL — ABNORMAL HIGH (ref 70–99)
Glucose-Capillary: 124 mg/dL — ABNORMAL HIGH (ref 70–99)

## 2023-10-03 MED ORDER — CEFADROXIL 500 MG PO CAPS: 500.0000 mg | ORAL_CAPSULE | Freq: Two times a day (BID) | ORAL | 0 refills | Status: AC

## 2023-10-03 MED ORDER — FUROSEMIDE 20 MG PO TABS: 20.0000 mg | ORAL_TABLET | Freq: Every day | ORAL | 2 refills | Status: DC

## 2023-10-03 MED ORDER — DILTIAZEM HCL ER COATED BEADS 240 MG PO CP24: 240.0000 mg | ORAL_CAPSULE | Freq: Every day | ORAL | 2 refills | Status: DC

## 2023-10-03 MED ORDER — ENOXAPARIN SODIUM 30 MG/0.3ML IJ SOSY: 30.0000 mg | PREFILLED_SYRINGE | INTRAMUSCULAR | Status: DC

## 2023-10-03 MED ORDER — CARVEDILOL 3.125 MG PO TABS: 3.1250 mg | ORAL_TABLET | Freq: Two times a day (BID) | ORAL | 2 refills | Status: DC

## 2023-10-03 MED ORDER — ACETAMINOPHEN 500 MG PO TABS: 1000.0000 mg | ORAL_TABLET | Freq: Three times a day (TID) | ORAL | Status: DC | PRN

## 2023-10-03 MED ORDER — PANTOPRAZOLE SODIUM 40 MG PO TBEC: 40.0000 mg | DELAYED_RELEASE_TABLET | Freq: Every day | ORAL | 1 refills | Status: DC

## 2023-10-03 MED ORDER — HYDRALAZINE HCL 50 MG PO TABS: 50.0000 mg | ORAL_TABLET | Freq: Two times a day (BID) | ORAL | 3 refills | Status: DC

## 2023-10-03 MED ORDER — ISOSORBIDE MONONITRATE ER 60 MG PO TB24: 60.0000 mg | ORAL_TABLET | Freq: Every day | ORAL | 2 refills | Status: DC

## 2023-10-03 MED ORDER — OLMESARTAN MEDOXOMIL 40 MG PO TABS: 40.0000 mg | ORAL_TABLET | Freq: Every day | ORAL | 2 refills | Status: DC

## 2023-10-03 NOTE — Discharge Summary (Signed)
Physician Discharge Summary   Patient: Sherry Waller MRN: [id number] DOB: 12-23-1935  Admit date:     10/01/2023  Discharge date: 10/03/23  Discharge Physician: Vassie Loll   PCP: Assunta Found, MD   Recommendations at discharge:  Repeat basic metabolic panel to follow trigeminal function Reassess blood pressure and further adjust antihypertensive treatment as needed. Follow complete resolution of patient's UTI symptoms after completing antibiotic therapy.   Discharge Diagnoses: Principal Problem:   Hypertensive urgency Elevated troponin UTI Nausea/vomiting Chronic kidney disease stage IIIb Hypothyroidism History of depression/neuropathy Type 2 diabetes with nephropathy/neuropathy.  Brief hospital admission course: Sherry Waller is a 87 y.o. female with medical history significant of hypothyroidism, depression, hypertension, type 2 diabetes with chronic kidney disease stage III b and GERD; presented to the emergency department secondary to nausea/vomiting, increased frequency and not feeling well.  Symptoms present for the last 1-2 days and worsening.  On the day of admission patient reported being unable to take her medications for the last 36 hours and was found to be significantly hypertensive.  Patient reports no chest pain, no shortness of breath, no hematuria, no hematochezia/melena, no focal weaknesses or any other complaints.   Workup in the ED demonstrating the presence of UTI and hypertensive urgency.  As needed medications have been provided to help with stabilizing blood pressure and a Cardene drip has been initiated.   Mild troponin elevation has been seen; cultures taken and antibiotics started; TR[facility] contacted to place patient in the hospital for further evaluation and management.  Assessment and Plan: -Patient has been weaned off nicardipine drip and -Continue carvedilol, clonidine, hydralazine, Benicar, extended release Cardizem and Imdur. -Continue patient  follow-up with PCP and nephrologist. -Patient advised to follow-up heart healthy/low-sodium diet.   2-elevated troponin -In the setting of demand ischemia from hypertensive urgency. -No chest pain or shortness of breath -No ischemic changes on telemetry or EKG -2D echo demonstrating preserved ejection fraction and no wall motion abnormalities.   3-E. coli UTI -Per sensitivity, resistant to ampicillin, gentamicin and Bactrim. -Patient is present no nausea or vomiting -Continue to maintain adequate hydration (orally) -Antibiotics transition to oral cefadroxil to complete therapy.   4-nausea/vomiting -Most likely associated with UTI -Continue PPI and antiemetics -Tolerating diet without problems and expressing no nausea or vomiting at discharge. -Patient advised to maintain adequate hydration.   5-chronic kidney disease stage IIIb -Overall appears to be stable and at baseline -Continue to maintain adequate hydration and Minimize nephrotoxic agents -Continue patient follow-up with nephrologist. -Continue to follow renal function trend.   6-hypothyroidism -Continue Synthroid. -Continue to follow thyroid panel intermittently.   7-history of depression/neuropathy -Continue treatment with Lexapro and Neurontin -No suicidal ideation or hallucinations. -Stable mood at discharge.   8-type 2 diabetes with nephropathy/neuropathy -Resume home oral hypoglycemic agent -Continue to follow modified carbohydrate diet -continue to follow CBGs/A1c with further adjustment to hypoglycemic regimen as needed.     Consultants: Cardiology curbside. Procedures performed: See below for x-ray report; 2D echo. Disposition: Home Diet recommendation: Heart healthy/low-sodium diet.   DISCHARGE MEDICATION: Allergies as of 10/03/2023       Reactions   Fenofibrate Other (See Comments)   Aches   Statins Itching   Sulfa Antibiotics Other (See Comments)   headahces        Medication List      STOP taking these medications    Alpha-Lipoic Acid 100 MG Caps   cloNIDine 0.2 mg/24hr patch Commonly known as: CATAPRES - Dosed in mg/24 hr  diltiazem 360 MG 24 hr capsule Commonly known as: TIAZAC   Fish Oil Oil   ibuprofen 200 MG tablet Commonly known as: ADVIL   losartan 100 MG tablet Commonly known as: COZAAR   metoprolol-hydrochlorothiazide 100-25 MG tablet Commonly known as: LOPRESSOR HCT   NIFEdipine 90 MG 24 hr tablet Commonly known as: ADALAT CC   predniSONE 10 MG tablet Commonly known as: DELTASONE       TAKE these medications    acetaminophen 500 MG tablet Commonly known as: TYLENOL Take 2 tablets (1,000 mg total) by mouth every 8 (eight) hours as needed for mild pain (pain score 1-3), headache or fever.   carvedilol 3.125 MG tablet Commonly known as: COREG Take 1 tablet (3.125 mg total) by mouth 2 (two) times daily. What changed:  medication strength how much to take   cefadroxil 500 MG capsule Commonly known as: DURICEF Take 1 capsule (500 mg total) by mouth 2 (two) times daily for 4 days.   cloNIDine 0.3 MG tablet Commonly known as: CATAPRES Take 0.3 mg by mouth 3 (three) times daily.   diltiazem 240 MG 24 hr capsule Commonly known as: CARDIZEM CD Take 1 capsule (240 mg total) by mouth daily. Start taking on: October 04, 2023   escitalopram 10 MG tablet Commonly known as: LEXAPRO Take 10 mg by mouth daily. Take 1/2 a tablet by mouth once a day   furosemide 20 MG tablet Commonly known as: LASIX Take 1 tablet (20 mg total) by mouth daily. What changed: See the new instructions.   gabapentin 100 MG capsule Commonly known as: NEURONTIN Take 100 mg by mouth daily.   hydrALAZINE 50 MG tablet Commonly known as: APRESOLINE Take 1 tablet (50 mg total) by mouth in the morning and at bedtime. What changed:  medication strength how much to take when to take this   isosorbide mononitrate 60 MG 24 hr tablet Commonly known as:  IMDUR Take 1 tablet (60 mg total) by mouth daily. Start taking on: October 04, 2023   levothyroxine 50 MCG tablet Commonly known as: SYNTHROID Take 50 mcg by mouth daily before breakfast.   olmesartan 40 MG tablet Commonly known as: BENICAR Take 1 tablet (40 mg total) by mouth daily.   pantoprazole 40 MG tablet Commonly known as: PROTONIX Take 1 tablet (40 mg total) by mouth daily. Start taking on: October 04, 2023   traMADol 50 MG tablet Commonly known as: ULTRAM Take 1 tablet (50 mg total) by mouth every 12 (twelve) hours as needed.        Follow-up Information     Assunta Found, MD. Schedule an appointment as soon as possible for a visit in 2 week(s).   Specialty: Family Medicine Contact information: [address] [place] [address] [phone]                Discharge Exam: Filed Weights   10/01/23 0424 10/02/23 0400  Weight: 65 kg 66.9 kg   General exam: Alert, awake, oriented x 3; no chest pain, no shortness of breath. Respiratory system: Clear to auscultation. Respiratory effort normal.  Good saturation on room air. Cardiovascular system:RRR. No rubs or gallops; no JVD. Gastrointestinal system: Abdomen is nondistended, soft and nontender. No organomegaly or masses felt. Normal bowel sounds heard. Central nervous system:  No focal neurological deficits. Extremities: No cyanosis or clubbing; no edema. Skin: No petechiae. Psychiatry: Judgement and insight appear normal. Mood & affect appropriate.   Condition at discharge: Stable and improved.  The  results of significant diagnostics from this hospitalization (including imaging, microbiology, ancillary and laboratory) are listed below for reference.   Imaging Studies: ECHOCARDIOGRAM COMPLETE Result Date: 10/02/2023    ECHOCARDIOGRAM REPORT   Patient Name:   Sherry Waller Date of Exam: 10/02/2023 Medical Rec #:  [id number]       Height:       65.0 in Accession #:    [id number]      Weight:        147.5 lb Date of Birth:  [birth year]       BSA:          1.738 m Patient Age:    87 years        BP:           171/71 mmHg Patient Gender: F               HR:           59 bpm. [facility]am [facility] Procedure: 2D Echo, Cardiac Doppler and Color Doppler Indications:    Elevated Troponin  History:        Patient has no prior history of Echocardiogram examinations.                 Risk Factors:Hypertensive urgency. DOE p covid 19.  Sonographer:    Celesta Gentile RCS Referring Phys: [phone] [place] Valcarcel IMPRESSIONS  1. Left ventricular ejection fraction, by estimation, is 65 to 70%. The left ventricle has normal function. The left ventricle has no regional wall motion abnormalities. There is moderate concentric left ventricular hypertrophy. Left ventricular diastolic parameters are indeterminate.  2. Right ventricular systolic function is normal. The right ventricular size is normal.  3. Left atrial size was mildly dilated.  4. The mitral valve is abnormal. Mild to moderate mitral valve regurgitation. Moderate mitral annular calcification.  5. The aortic valve is tricuspid. There is mild calcification of the aortic valve. Aortic valve regurgitation is not visualized. Aortic valve sclerosis/calcification is present, without any evidence of aortic stenosis.  6. The inferior vena cava is normal in size with greater than 50% respiratory variability, suggesting right atrial pressure of 3 mmHg. Comparison(s): No prior Echocardiogram. FINDINGS  Left Ventricle: Left ventricular ejection fraction, by estimation, is 65 to 70%. The left ventricle has normal function. The left ventricle has no regional wall motion abnormalities. The left ventricular internal cavity size was normal in size. There is  moderate concentric left ventricular hypertrophy. Left ventricular diastolic parameters are indeterminate. Right Ventricle: The right ventricular size is normal. No increase in right ventricular wall thickness. Right ventricular  systolic function is normal. Left Atrium: Left atrial size was mildly dilated. Right Atrium: Right atrial size was not assessed. Pericardium: Trivial pericardial effusion is present. The pericardial effusion is surrounding the apex. Mitral Valve: Calcified and prolapse in the 2 chamber, central regurgitation worst in the 4 chamber. The mitral valve is abnormal. Moderate mitral annular calcification. Mild to moderate mitral valve regurgitation. Tricuspid Valve: The tricuspid valve is normal in structure. Tricuspid valve regurgitation is not demonstrated. No evidence of tricuspid stenosis. Aortic Valve: The aortic valve is tricuspid. There is mild calcification of the aortic valve. Aortic valve regurgitation is not visualized. Aortic valve sclerosis/calcification is present, without any evidence of aortic stenosis. Pulmonic Valve: The pulmonic valve was normal in structure. Pulmonic valve regurgitation is trivial. No evidence of pulmonic stenosis. Aorta: The aortic root is normal in size and structure. Venous: The inferior vena cava is normal  in size with greater than 50% respiratory variability, suggesting right atrial pressure of 3 mmHg. IAS/Shunts: The atrial septum is grossly normal.  LEFT VENTRICLE PLAX 2D LVIDd:         4.20 cm   Diastology LVIDs:         2.70 cm   LV e' medial:    2.83 cm/s LV PW:         1.30 cm   LV E/e' medial:  21.8 LV IVS:        1.30 cm   LV e' lateral:   3.15 cm/s LVOT diam:     1.90 cm   LV E/e' lateral: 19.6 LV SV:         63 LV SV Index:   36 LVOT Area:     2.84 cm  RIGHT VENTRICLE RV S prime:     10.70 cm/s TAPSE (M-mode): 1.8 cm LEFT ATRIUM             Index        RIGHT ATRIUM           Index LA diam:        3.60 cm 2.07 cm/m   RA Area:     15.70 cm LA Vol (A2C):   73.2 ml 42.12 ml/m  RA Volume:   39.00 ml  22.44 ml/m LA Vol (A4C):   56.1 ml 32.28 ml/m LA Biplane Vol: 64.5 ml 37.11 ml/m  AORTIC VALVE LVOT Vmax:   102.00 cm/s LVOT Vmean:  66.600 cm/s LVOT VTI:    0.222 m   AORTA Ao Root diam: 3.20 cm MITRAL VALVE MV Area (PHT): 2.83 cm       SHUNTS MV Decel Time: 268 msec       Systemic VTI:  0.22 m MR Peak grad:    139.2 mmHg   Systemic Diam: 1.90 cm MR Mean grad:    86.0 mmHg MR Vmax:         590.00 cm/s MR Vmean:        425.0 cm/s MR PISA:         1.01 cm MR PISA Eff ROA: 5 mm MR PISA Radius:  0.40 cm MV E velocity: 61.70 cm/s MV A velocity: 101.00 cm/s MV E/A ratio:  0.61 Riley Lam MD Electronically signed by Riley Lam MD Signature Date/Time: 10/02/2023/2:56:39 PM    Final    CT HEAD WO CONTRAST ( ) Result Date: 10/01/2023 CLINICAL DATA:  Headache, new onset. EXAM: CT HEAD WITHOUT CONTRAST TECHNIQUE: Contiguous axial images were obtained from the base of the skull through the vertex without intravenous contrast. RADIATION DOSE REDUCTION: This exam was performed according to the departmental dose-optimization program which includes automated exposure control, adjustment of the mA and/or kV according to patient size and/or use of iterative reconstruction technique. COMPARISON:  None Available. FINDINGS: Brain: No evidence of acute infarction, hemorrhage, hydrocephalus, extra-axial collection or mass lesion/mass effect. Preserved brain volume for age with minor white matter low-density. Intermittent superimposed streak artifact. Vascular: No hyperdense vessel or unexpected calcification. Skull: Normal. Negative for fracture or focal lesion. Sinuses/Orbits: Negative.  No sinusitis. IMPRESSION: Unremarkable head CT for age. Electronically Signed   By: Tiburcio Pea M.D.   On: 10/01/2023 06:27   DG Chest Port 1 View Result Date: 10/01/2023 CLINICAL DATA:  Tachycardia and inability to rest.  Nausea EXAM: PORTABLE CHEST 1 VIEW COMPARISON:  08/13/2022 FINDINGS: Artifact from EKG leads. Normal heart size and mediastinal contours. No acute infiltrate or edema.  No effusion or pneumothorax. No acute osseous findings. Generalized thoracic endplate spurring  with mild scoliosis. IMPRESSION: No evidence of active disease. Electronically Signed   By: Tiburcio Pea M.D.   On: 10/01/2023 05:37    Microbiology: Results for orders placed or performed during the hospital encounter of 10/01/23  Urine Culture (for pregnant, neutropenic or urologic patients or patients with an indwelling urinary catheter)     Status: Abnormal   Collection Time: 10/01/23  4:54 AM   Specimen: Urine, Clean Catch  Result Value Ref Range Status   Specimen Description   Final    URINE, CLEAN CATCH Performed at [facility], [address], [place], [place] [address]    Special Requests   Final    Normal Performed at [facility], [address] [place], [place] [address]    Culture >=100,000 COLONIES/mL ESCHERICHIA COLI (A)  Final   Report Status 10/03/2023 FINAL  Final   Organism ID, Bacteria ESCHERICHIA COLI (A)  Final      Susceptibility   Escherichia coli - MIC*    AMPICILLIN >=32 RESISTANT Resistant     CEFAZOLIN <=4 SENSITIVE Sensitive     CEFEPIME <=0.12 SENSITIVE Sensitive     CEFTRIAXONE <=0.25 SENSITIVE Sensitive     CIPROFLOXACIN <=0.25 SENSITIVE Sensitive     GENTAMICIN >=16 RESISTANT Resistant     IMIPENEM <=0.25 SENSITIVE Sensitive     NITROFURANTOIN <=16 SENSITIVE Sensitive     TRIMETH/SULFA >=320 RESISTANT Resistant     AMPICILLIN/SULBACTAM 16 INTERMEDIATE Intermediate     PIP/TAZO <=4 SENSITIVE Sensitive ug/mL    * >=100,000 COLONIES/mL ESCHERICHIA COLI  MRSA Next Gen by PCR, Nasal     Status: None   Collection Time: 10/01/23  1:59 PM   Specimen: Nasal Mucosa; Nasal Swab  Result Value Ref Range Status   MRSA by PCR Next Gen NOT DETECTED NOT DETECTED Final    Comment: (NOTE) The GeneXpert MRSA Assay (FDA approved for NASAL specimens only), is one component of a comprehensive MRSA colonization surveillance program. It is not intended to diagnose MRSA infection nor to guide or monitor treatment for MRSA infections. Test performance is not FDA  approved in patients less than 96 years old. Performed at [facility], [address], [place], [place] [address]     Labs: CBC: Recent Labs  Lab 10/01/23 0454 10/02/23 0422  WBC 16.3* 12.6*  HGB 14.9 13.1  HCT 43.6 40.9  MCV 85.7 89.1  PLT 328 250   Basic Metabolic Panel: Recent Labs  Lab 10/01/23 0454 10/02/23 0422 10/03/23 0742  NA 134* 134* 134*  K 3.6 3.8 3.7  CL 103 101 104  CO2 16* 22 17*  GLUCOSE 257* 139* 129*  BUN 17 23 29*  CREATININE 1.10* 1.33* 1.28*  CALCIUM 9.9 9.2 9.5  MG 1.8  1.9  --  2.1  PHOS 1.7*  --   --    Liver Function Tests: Recent Labs  Lab 10/01/23 0454  AST 32  ALT 20  ALKPHOS 88  BILITOT 0.8  PROT 7.6  ALBUMIN 4.1   CBG: Recent Labs  Lab 10/02/23 1212 10/02/23 1555 10/02/23 2038 10/03/23 0804 10/03/23 1148  GLUCAP 99 120* 116* 153* 124*    Discharge time spent: greater than 30 minutes.  Signed: Vassie Loll, MD [facility] 10/03/2023

## 2023-10-11 ENCOUNTER — Observation Stay (HOSPITAL_COMMUNITY): Payer: Medicare HMO

## 2023-10-11 ENCOUNTER — Inpatient Hospital Stay (HOSPITAL_COMMUNITY)
Admission: EM | Admit: 2023-10-11 | Discharge: 2023-10-23 | DRG: 682 | Disposition: A | Discharge status: Skilled Nursing Facility | Payer: Medicare HMO | Attending: Internal Medicine | Admitting: Internal Medicine

## 2023-10-11 ENCOUNTER — Encounter (HOSPITAL_COMMUNITY): Payer: Self-pay | Admitting: Emergency Medicine

## 2023-10-11 ENCOUNTER — Other Ambulatory Visit: Payer: Self-pay

## 2023-10-11 ENCOUNTER — Emergency Department (HOSPITAL_COMMUNITY): Payer: Medicare HMO

## 2023-10-11 DIAGNOSIS — F32A Depression, unspecified: Secondary | ICD-10-CM | POA: Diagnosis present

## 2023-10-11 DIAGNOSIS — N170 Acute kidney failure with tubular necrosis: Secondary | ICD-10-CM | POA: Diagnosis not present

## 2023-10-11 DIAGNOSIS — E538 Deficiency of other specified B group vitamins: Secondary | ICD-10-CM | POA: Diagnosis present

## 2023-10-11 DIAGNOSIS — Z79890 Hormone replacement therapy (postmenopausal): Secondary | ICD-10-CM

## 2023-10-11 DIAGNOSIS — R0602 Shortness of breath: Secondary | ICD-10-CM | POA: Diagnosis not present

## 2023-10-11 DIAGNOSIS — J9601 Acute respiratory failure with hypoxia: Secondary | ICD-10-CM | POA: Diagnosis not present

## 2023-10-11 DIAGNOSIS — I10 Essential (primary) hypertension: Secondary | ICD-10-CM | POA: Diagnosis present

## 2023-10-11 DIAGNOSIS — R319 Hematuria, unspecified: Secondary | ICD-10-CM | POA: Diagnosis present

## 2023-10-11 DIAGNOSIS — E8770 Fluid overload, unspecified: Secondary | ICD-10-CM | POA: Diagnosis not present

## 2023-10-11 DIAGNOSIS — Z9049 Acquired absence of other specified parts of digestive tract: Secondary | ICD-10-CM

## 2023-10-11 DIAGNOSIS — Z6830 Body mass index (BMI) 30.0-30.9, adult: Secondary | ICD-10-CM

## 2023-10-11 DIAGNOSIS — I160 Hypertensive urgency: Secondary | ICD-10-CM

## 2023-10-11 DIAGNOSIS — E875 Hyperkalemia: Secondary | ICD-10-CM | POA: Diagnosis present

## 2023-10-11 DIAGNOSIS — E039 Hypothyroidism, unspecified: Secondary | ICD-10-CM | POA: Diagnosis present

## 2023-10-11 DIAGNOSIS — I129 Hypertensive chronic kidney disease with stage 1 through stage 4 chronic kidney disease, or unspecified chronic kidney disease: Secondary | ICD-10-CM | POA: Diagnosis present

## 2023-10-11 DIAGNOSIS — J690 Pneumonitis due to inhalation of food and vomit: Secondary | ICD-10-CM | POA: Diagnosis not present

## 2023-10-11 DIAGNOSIS — E1140 Type 2 diabetes mellitus with diabetic neuropathy, unspecified: Secondary | ICD-10-CM | POA: Diagnosis present

## 2023-10-11 DIAGNOSIS — R339 Retention of urine, unspecified: Secondary | ICD-10-CM | POA: Diagnosis not present

## 2023-10-11 DIAGNOSIS — N179 Acute kidney failure, unspecified: Principal | ICD-10-CM | POA: Diagnosis present

## 2023-10-11 DIAGNOSIS — D509 Iron deficiency anemia, unspecified: Secondary | ICD-10-CM | POA: Diagnosis present

## 2023-10-11 DIAGNOSIS — E669 Obesity, unspecified: Secondary | ICD-10-CM | POA: Diagnosis present

## 2023-10-11 DIAGNOSIS — Z1152 Encounter for screening for COVID-19: Secondary | ICD-10-CM

## 2023-10-11 DIAGNOSIS — K219 Gastro-esophageal reflux disease without esophagitis: Secondary | ICD-10-CM | POA: Diagnosis present

## 2023-10-11 DIAGNOSIS — N183 Chronic kidney disease, stage 3 unspecified: Secondary | ICD-10-CM | POA: Diagnosis present

## 2023-10-11 DIAGNOSIS — N1831 Chronic kidney disease, stage 3a: Secondary | ICD-10-CM

## 2023-10-11 DIAGNOSIS — M25572 Pain in left ankle and joints of left foot: Secondary | ICD-10-CM | POA: Diagnosis present

## 2023-10-11 DIAGNOSIS — Z66 Do not resuscitate: Secondary | ICD-10-CM | POA: Diagnosis not present

## 2023-10-11 DIAGNOSIS — I1A0 Resistant hypertension: Secondary | ICD-10-CM | POA: Diagnosis present

## 2023-10-11 DIAGNOSIS — G9341 Metabolic encephalopathy: Secondary | ICD-10-CM | POA: Diagnosis not present

## 2023-10-11 DIAGNOSIS — N1832 Chronic kidney disease, stage 3b: Secondary | ICD-10-CM | POA: Diagnosis present

## 2023-10-11 DIAGNOSIS — Z882 Allergy status to sulfonamides status: Secondary | ICD-10-CM

## 2023-10-11 DIAGNOSIS — E872 Acidosis, unspecified: Secondary | ICD-10-CM | POA: Diagnosis present

## 2023-10-11 DIAGNOSIS — E871 Hypo-osmolality and hyponatremia: Secondary | ICD-10-CM | POA: Diagnosis present

## 2023-10-11 DIAGNOSIS — Z8041 Family history of malignant neoplasm of ovary: Secondary | ICD-10-CM

## 2023-10-11 DIAGNOSIS — Z888 Allergy status to other drugs, medicaments and biological substances status: Secondary | ICD-10-CM

## 2023-10-11 DIAGNOSIS — E861 Hypovolemia: Secondary | ICD-10-CM | POA: Diagnosis present

## 2023-10-11 DIAGNOSIS — I255 Ischemic cardiomyopathy: Secondary | ICD-10-CM | POA: Diagnosis present

## 2023-10-11 DIAGNOSIS — E1122 Type 2 diabetes mellitus with diabetic chronic kidney disease: Secondary | ICD-10-CM | POA: Diagnosis present

## 2023-10-11 DIAGNOSIS — Z79899 Other long term (current) drug therapy: Secondary | ICD-10-CM

## 2023-10-11 DIAGNOSIS — Z808 Family history of malignant neoplasm of other organs or systems: Secondary | ICD-10-CM

## 2023-10-11 LAB — D-DIMER, QUANTITATIVE: D-Dimer, Quant: 6.59 ug{FEU}/mL — ABNORMAL HIGH (ref 0.00–0.50)

## 2023-10-11 LAB — RESP PANEL BY RT-PCR (RSV, FLU A&B, COVID)  RVPGX2
Influenza A by PCR: NEGATIVE
Influenza B by PCR: NEGATIVE
Resp Syncytial Virus by PCR: NEGATIVE
SARS Coronavirus 2 by RT PCR: NEGATIVE

## 2023-10-11 LAB — COMPREHENSIVE METABOLIC PANEL
ALT: 25 U/L (ref 0–44)
AST: 36 U/L (ref 15–41)
Albumin: 2.9 g/dL — ABNORMAL LOW (ref 3.5–5.0)
Alkaline Phosphatase: 126 U/L (ref 38–126)
Anion gap: 10 (ref 5–15)
BUN: 67 mg/dL — ABNORMAL HIGH (ref 8–23)
CO2: 18 mmol/L — ABNORMAL LOW (ref 22–32)
Calcium: 8.8 mg/dL — ABNORMAL LOW (ref 8.9–10.3)
Chloride: 98 mmol/L (ref 98–111)
Creatinine, Ser: 2.23 mg/dL — ABNORMAL HIGH (ref 0.44–1.00)
GFR, Estimated: 21 mL/min — ABNORMAL LOW (ref >60)
Glucose, Bld: 175 mg/dL — ABNORMAL HIGH (ref 70–99)
Potassium: 4.9 mmol/L (ref 3.5–5.1)
Sodium: 126 mmol/L — ABNORMAL LOW (ref 135–145)
Total Bilirubin: 0.8 mg/dL (ref <1.2)
Total Protein: 6.5 g/dL (ref 6.5–8.1)

## 2023-10-11 LAB — CBC WITH DIFFERENTIAL/PLATELET
Abs Immature Granulocytes: 0.05 10*3/uL (ref 0.00–0.07)
Basophils Absolute: 0.1 10*3/uL (ref 0.0–0.1)
Basophils Relative: 1 %
Eosinophils Absolute: 0.3 10*3/uL (ref 0.0–0.5)
Eosinophils Relative: 3 %
HCT: 29.8 % — ABNORMAL LOW (ref 36.0–46.0)
Hemoglobin: 9.9 g/dL — ABNORMAL LOW (ref 12.0–15.0)
Immature Granulocytes: 1 %
Lymphocytes Relative: 7 %
Lymphs Abs: 0.7 10*3/uL (ref 0.7–4.0)
MCH: 29.7 pg (ref 26.0–34.0)
MCHC: 33.2 g/dL (ref 30.0–36.0)
MCV: 89.5 fL (ref 80.0–100.0)
Monocytes Absolute: 0.8 10*3/uL (ref 0.1–1.0)
Monocytes Relative: 8 %
Neutro Abs: 8 10*3/uL — ABNORMAL HIGH (ref 1.7–7.7)
Neutrophils Relative %: 80 %
Platelets: 268 10*3/uL (ref 150–400)
RBC: 3.33 MIL/uL — ABNORMAL LOW (ref 3.87–5.11)
RDW: 13.6 % (ref 11.5–15.5)
WBC: 10 10*3/uL (ref 4.0–10.5)
nRBC: 0 % (ref 0.0–0.2)

## 2023-10-11 LAB — BLOOD GAS, ARTERIAL
Acid-base deficit: 4.2 mmol/L — ABNORMAL HIGH (ref 0.0–2.0)
Bicarbonate: 19.6 mmol/L — ABNORMAL LOW (ref 20.0–28.0)
Drawn by: 30136
FIO2: 32 %
O2 Saturation: 91.7 %
Patient temperature: 38
pCO2 arterial: 32 mm[Hg] (ref 32–48)
pH, Arterial: 7.4 (ref 7.35–7.45)
pO2, Arterial: 61 mm[Hg] — ABNORMAL LOW (ref 83–108)

## 2023-10-11 LAB — URINALYSIS, ROUTINE W REFLEX MICROSCOPIC
Bacteria, UA: NONE SEEN
Bilirubin Urine: NEGATIVE
Glucose, UA: NEGATIVE mg/dL
Ketones, ur: NEGATIVE mg/dL
Leukocytes,Ua: NEGATIVE
Nitrite: NEGATIVE
Protein, ur: NEGATIVE mg/dL
Specific Gravity, Urine: 1.009 (ref 1.005–1.030)
pH: 5 (ref 5.0–8.0)

## 2023-10-11 LAB — FERRITIN: Ferritin: 127 ng/mL (ref 11–307)

## 2023-10-11 LAB — IRON AND TIBC
Iron: 23 ug/dL — ABNORMAL LOW (ref 28–170)
Saturation Ratios: 8 % — ABNORMAL LOW (ref 10.4–31.8)
TIBC: 283 ug/dL (ref 250–450)
UIBC: 260 ug/dL

## 2023-10-11 LAB — TROPONIN I (HIGH SENSITIVITY): Troponin I (High Sensitivity): 57 ng/L — ABNORMAL HIGH (ref <18)

## 2023-10-11 LAB — BRAIN NATRIURETIC PEPTIDE: B Natriuretic Peptide: 1005 pg/mL — ABNORMAL HIGH (ref 0.0–100.0)

## 2023-10-11 LAB — MAGNESIUM: Magnesium: 2.5 mg/dL — ABNORMAL HIGH (ref 1.7–2.4)

## 2023-10-11 LAB — GLUCOSE, CAPILLARY: Glucose-Capillary: 169 mg/dL — ABNORMAL HIGH (ref 70–99)

## 2023-10-11 MED ORDER — CARVEDILOL 12.5 MG PO TABS
12.5000 mg | ORAL_TABLET | Freq: Two times a day (BID) | ORAL | Status: DC
  Administered 2023-10-11 – 2023-10-17 (×12): 12.5 mg via ORAL
  Filled 2023-10-11 (×13): qty 1

## 2023-10-11 MED ORDER — ISOSORBIDE MONONITRATE ER 60 MG PO TB24
60.0000 mg | ORAL_TABLET | Freq: Every day | ORAL | Status: DC
  Administered 2023-10-11 – 2023-10-23 (×12): 60 mg via ORAL
  Filled 2023-10-11 (×13): qty 1

## 2023-10-11 MED ORDER — HYDRALAZINE HCL 50 MG PO TABS
100.0000 mg | ORAL_TABLET | Freq: Three times a day (TID) | ORAL | Status: DC
  Administered 2023-10-11 – 2023-10-21 (×26): 100 mg via ORAL
  Filled 2023-10-11 (×2): qty 2
  Filled 2023-10-11 (×3): qty 4
  Filled 2023-10-11 (×4): qty 2
  Filled 2023-10-11 (×2): qty 4
  Filled 2023-10-11: qty 2
  Filled 2023-10-11: qty 4
  Filled 2023-10-11 (×3): qty 2
  Filled 2023-10-11 (×3): qty 4
  Filled 2023-10-11: qty 2
  Filled 2023-10-11: qty 4
  Filled 2023-10-11 (×7): qty 2
  Filled 2023-10-11 (×2): qty 4

## 2023-10-11 MED ORDER — SODIUM CHLORIDE 0.9 % IV SOLN: INTRAVENOUS | Status: DC

## 2023-10-11 MED ORDER — INSULIN ASPART 100 UNIT/ML IJ SOLN
0.0000 [IU] | Freq: Three times a day (TID) | INTRAMUSCULAR | Status: DC
  Administered 2023-10-12 – 2023-10-13 (×3): 1 [IU] via SUBCUTANEOUS
  Administered 2023-10-13: 2 [IU] via SUBCUTANEOUS
  Administered 2023-10-13 – 2023-10-22 (×6): 1 [IU] via SUBCUTANEOUS

## 2023-10-11 MED ORDER — ALBUTEROL SULFATE HFA 108 (90 BASE) MCG/ACT IN AERS: 2.0000 | INHALATION_SPRAY | RESPIRATORY_TRACT | Status: DC | PRN

## 2023-10-11 MED ORDER — ESCITALOPRAM OXALATE 10 MG PO TABS
5.0000 mg | ORAL_TABLET | Freq: Every day | ORAL | Status: DC
  Administered 2023-10-12 – 2023-10-23 (×12): 5 mg via ORAL
  Filled 2023-10-11 (×12): qty 1

## 2023-10-11 MED ORDER — LABETALOL HCL 5 MG/ML IV SOLN
20.0000 mg | Freq: Once | INTRAVENOUS | Status: AC
  Administered 2023-10-11: 20 mg via INTRAVENOUS
  Filled 2023-10-11: qty 4

## 2023-10-11 MED ORDER — OXYCODONE HCL 5 MG PO TABS
5.0000 mg | ORAL_TABLET | Freq: Two times a day (BID) | ORAL | Status: DC | PRN
  Administered 2023-10-14 – 2023-10-19 (×4): 5 mg via ORAL
  Filled 2023-10-11 (×5): qty 1

## 2023-10-11 MED ORDER — ACETAMINOPHEN 325 MG PO TABS
650.0000 mg | ORAL_TABLET | Freq: Four times a day (QID) | ORAL | Status: DC | PRN
  Administered 2023-10-11 – 2023-10-22 (×15): 650 mg via ORAL
  Filled 2023-10-11 (×15): qty 2

## 2023-10-11 MED ORDER — SODIUM CHLORIDE 0.9% FLUSH
3.0000 mL | Freq: Two times a day (BID) | INTRAVENOUS | Status: DC
  Administered 2023-10-11 – 2023-10-14 (×5): 3 mL via INTRAVENOUS

## 2023-10-11 MED ORDER — INSULIN ASPART 100 UNIT/ML IJ SOLN
0.0000 [IU] | Freq: Every day | INTRAMUSCULAR | Status: DC
  Administered 2023-10-14: 5 [IU] via SUBCUTANEOUS

## 2023-10-11 MED ORDER — BISACODYL 10 MG RE SUPP: 10.0000 mg | Freq: Every day | RECTAL | Status: DC | PRN

## 2023-10-11 MED ORDER — CLONIDINE HCL 0.1 MG PO TABS
0.3000 mg | ORAL_TABLET | Freq: Three times a day (TID) | ORAL | Status: AC
Start: 2023-10-11 — End: ?
  Administered 2023-10-11 – 2023-10-15 (×11): 0.3 mg via ORAL
  Filled 2023-10-11 (×12): qty 3
  Filled 2023-10-11: qty 1

## 2023-10-11 MED ORDER — DILTIAZEM HCL ER COATED BEADS 120 MG PO CP24
240.0000 mg | ORAL_CAPSULE | Freq: Every day | ORAL | Status: DC
Start: 2023-10-11 — End: 2023-10-13
  Administered 2023-10-11 – 2023-10-13 (×3): 240 mg via ORAL
  Filled 2023-10-11 (×3): qty 2

## 2023-10-11 MED ORDER — ONDANSETRON HCL 4 MG/2ML IJ SOLN
4.0000 mg | Freq: Four times a day (QID) | INTRAMUSCULAR | Status: DC | PRN
  Administered 2023-10-14: 4 mg via INTRAVENOUS
  Filled 2023-10-11: qty 2

## 2023-10-11 MED ORDER — GABAPENTIN 100 MG PO CAPS
100.0000 mg | ORAL_CAPSULE | Freq: Every day | ORAL | Status: DC
  Administered 2023-10-12: 100 mg via ORAL
  Filled 2023-10-11: qty 1

## 2023-10-11 MED ORDER — PANTOPRAZOLE SODIUM 40 MG PO TBEC
40.0000 mg | DELAYED_RELEASE_TABLET | Freq: Every day | ORAL | Status: DC
  Administered 2023-10-12 – 2023-10-15 (×4): 40 mg via ORAL
  Filled 2023-10-11 (×4): qty 1

## 2023-10-11 MED ORDER — LEVOTHYROXINE SODIUM 50 MCG PO TABS
50.0000 ug | ORAL_TABLET | Freq: Every day | ORAL | Status: DC
  Administered 2023-10-12 – 2023-10-23 (×12): 50 ug via ORAL
  Filled 2023-10-11: qty 1
  Filled 2023-10-11: qty 2
  Filled 2023-10-11: qty 1
  Filled 2023-10-11 (×2): qty 2
  Filled 2023-10-11 (×7): qty 1

## 2023-10-11 MED ORDER — SODIUM CHLORIDE 0.9% FLUSH: 3.0000 mL | INTRAVENOUS | Status: DC | PRN

## 2023-10-11 MED ORDER — OXYCODONE HCL 5 MG PO TABS
5.0000 mg | ORAL_TABLET | Freq: Three times a day (TID) | ORAL | Status: DC | PRN
  Administered 2023-10-11: 5 mg via ORAL
  Filled 2023-10-11: qty 1

## 2023-10-11 MED ORDER — HEPARIN SODIUM (PORCINE) 5000 UNIT/ML IJ SOLN
5000.0000 [IU] | Freq: Three times a day (TID) | INTRAMUSCULAR | Status: DC
  Administered 2023-10-11 – 2023-10-23 (×35): 5000 [IU] via SUBCUTANEOUS
  Filled 2023-10-11 (×35): qty 1

## 2023-10-11 MED ORDER — TECHNETIUM TO 99M ALBUMIN AGGREGATED
4.4000 | Freq: Once | INTRAVENOUS | Status: AC | PRN
  Administered 2023-10-11: 4.4 via INTRAVENOUS

## 2023-10-11 MED ORDER — ALBUTEROL SULFATE (2.5 MG/3ML) 0.083% IN NEBU: 2.5000 mg | INHALATION_SOLUTION | RESPIRATORY_TRACT | Status: DC | PRN

## 2023-10-11 MED ORDER — SODIUM CHLORIDE 0.9% FLUSH
3.0000 mL | Freq: Two times a day (BID) | INTRAVENOUS | Status: DC
  Administered 2023-10-11 – 2023-10-16 (×8): 3 mL via INTRAVENOUS

## 2023-10-11 MED ORDER — TRAZODONE HCL 50 MG PO TABS
50.0000 mg | ORAL_TABLET | Freq: Every evening | ORAL | Status: DC | PRN
  Administered 2023-10-18 – 2023-10-22 (×3): 50 mg via ORAL
  Filled 2023-10-11 (×3): qty 1

## 2023-10-11 MED ORDER — SODIUM CHLORIDE 0.9 % IV SOLN: INTRAVENOUS | Status: AC | PRN

## 2023-10-11 MED ORDER — POLYETHYLENE GLYCOL 3350 17 G PO PACK
17.0000 g | PACK | Freq: Every day | ORAL | Status: DC | PRN
  Administered 2023-10-19: 17 g via ORAL
  Filled 2023-10-11: qty 1

## 2023-10-11 MED ORDER — FUROSEMIDE 20 MG PO TABS: 20.0000 mg | ORAL_TABLET | Freq: Every day | ORAL | Status: DC

## 2023-10-11 MED ORDER — ONDANSETRON HCL 4 MG PO TABS: 4.0000 mg | ORAL_TABLET | Freq: Four times a day (QID) | ORAL | Status: DC | PRN

## 2023-10-11 NOTE — ED Provider Notes (Signed)
[place] EMERGENCY DEPARTMENT AT [facility] Provider Note   CSN: [id number] Arrival date & time: 10/11/23  7253     History  Chief Complaint  Patient presents with   Shortness of Breath    Sherry Waller is a 87 y.o. female.   Shortness of Breath  This patient is an 87 year old female coming from home where she lives by herself, she has a history of hypothyroidism, hypertension which requires multiple medications including clonidine, carvedilol, hydralazine, olmesartan, furosemide, she also takes pantoprazole for acid reflux and levothyroxine for her thyroid.  Reportedly the patient had been admitted to the hospital several days ago on 20 December and discharged on the 22nd but during her time in the hospital was treated for hypertensive emergency, she required a antihypertensive drip which eventually was weaned off, and echocardiogram was obtained which was reassuring despite her troponins being elevated around 200.  Since going home the patient has been okay but over the last couple of days has felt increasingly weak and in fact was too weak to get out of her chair today urinating on herself.  She was short of breath, felt like she had mucus that she could not clear out of her chest and was found to be hypoxic to 87% by paramedics when they arrived.  They placed her on supplemental oxygen with resolution of the hypoxia, the patient denies fevers or diarrhea, she does endorse some swelling of her legs and has specific swelling of the right lower extremity compared to the left    Home Medications Prior to Admission medications   Medication Sig Start Date End Date Taking? Authorizing Provider  acetaminophen (TYLENOL) 500 MG tablet Take 2 tablets (1,000 mg total) by mouth every 8 (eight) hours as needed for mild pain (pain score 1-3), headache or fever. 10/03/23   Vassie Loll, MD  carvedilol (COREG) 3.125 MG tablet Take 1 tablet (3.125 mg total) by mouth 2 (two) times  daily. 10/03/23   Vassie Loll, MD  cloNIDine (CATAPRES) 0.3 MG tablet Take 0.3 mg by mouth 3 (three) times daily.    [provider]  diltiazem (CARDIZEM CD) 240 MG 24 hr capsule Take 1 capsule (240 mg total) by mouth daily. 10/04/23   Vassie Loll, MD  escitalopram (LEXAPRO) 10 MG tablet Take 10 mg by mouth daily. Take 1/2 a tablet by mouth once a day    [provider]  furosemide (LASIX) 20 MG tablet Take 1 tablet (20 mg total) by mouth daily. 10/03/23   Vassie Loll, MD  gabapentin (NEURONTIN) 100 MG capsule Take 100 mg by mouth daily.    [provider]  hydrALAZINE (APRESOLINE) 50 MG tablet Take 1 tablet (50 mg total) by mouth in the morning and at bedtime. 10/03/23 01/01/24  Vassie Loll, MD  isosorbide mononitrate (IMDUR) 60 MG 24 hr tablet Take 1 tablet (60 mg total) by mouth daily. 10/04/23   Vassie Loll, MD  levothyroxine (SYNTHROID, LEVOTHROID) 50 MCG tablet Take 50 mcg by mouth daily before breakfast.    [provider]  olmesartan (BENICAR) 40 MG tablet Take 1 tablet (40 mg total) by mouth daily. 10/03/23   Vassie Loll, MD  pantoprazole (PROTONIX) 40 MG tablet Take 1 tablet (40 mg total) by mouth daily. 10/04/23   Vassie Loll, MD  traMADol (ULTRAM) 50 MG tablet Take 1 tablet (50 mg total) by mouth every 12 (twelve) hours as needed. 08/21/23   Leath-Warren, Sadie Haber, NP      Allergies  Fenofibrate, Statins, and Sulfa antibiotics    Review of Systems   Review of Systems  Respiratory:  Positive for shortness of breath.   All other systems reviewed and are negative.   Physical Exam Updated Vital Signs BP (!) 198/55   Pulse 63   Temp 99.3 F (37.4 C) (Oral)   Resp 19   Ht 1.651 m (5\' 5" )   Wt 67 kg   SpO2 (!) 89%   BMI 24.58 kg/m  Physical Exam Vitals and nursing note reviewed.  Constitutional:      General: She is not in acute distress.    Appearance: She is well-developed.  HENT:     Head: Normocephalic and  atraumatic.     Mouth/Throat:     Pharynx: No oropharyngeal exudate.  Eyes:     General: No scleral icterus.       Right eye: No discharge.        Left eye: No discharge.     Conjunctiva/sclera: Conjunctivae normal.     Pupils: Pupils are equal, round, and reactive to light.  Neck:     Thyroid: No thyromegaly.     Vascular: No JVD.  Cardiovascular:     Rate and Rhythm: Normal rate and regular rhythm.     Heart sounds: Normal heart sounds. No murmur heard.    No friction rub. No gallop.  Pulmonary:     Effort: Pulmonary effort is normal. No respiratory distress.     Breath sounds: Rales present. No wheezing.     Comments: Rales at the bases that clear with deep breathing, no tachypnea Abdominal:     General: Bowel sounds are normal. There is no distension.     Palpations: Abdomen is soft. There is no mass.     Tenderness: There is no abdominal tenderness.  Musculoskeletal:        General: No tenderness. Normal range of motion.     Cervical back: Normal range of motion and neck supple.     Right lower leg: No tenderness. Edema present.     Left lower leg: No tenderness. No edema.  Lymphadenopathy:     Cervical: No cervical adenopathy.  Skin:    General: Skin is warm and dry.     Findings: No erythema or rash.  Neurological:     Mental Status: She is alert.     Coordination: Coordination normal.  Psychiatric:        Behavior: Behavior normal.     ED Results / Procedures / Treatments   Labs (all labs ordered are listed, but only abnormal results are displayed) Labs Reviewed  CBC WITH DIFFERENTIAL/PLATELET - Abnormal; Notable for the following components:      Result Value   RBC 3.33 (*)    Hemoglobin 9.9 (*)    HCT 29.8 (*)    Neutro Abs 8.0 (*)    All other components within normal limits  COMPREHENSIVE METABOLIC PANEL - Abnormal; Notable for the following components:   Sodium 126 (*)    CO2 18 (*)    Glucose, Bld 175 (*)    BUN 67 (*)    Creatinine, Ser 2.23  (*)    Calcium 8.8 (*)    Albumin 2.9 (*)    GFR, Estimated 21 (*)    All other components within normal limits  BRAIN NATRIURETIC PEPTIDE - Abnormal; Notable for the following components:   B Natriuretic Peptide 1,005.0 (*)    All other components within normal limits  D-DIMER, QUANTITATIVE -  Abnormal; Notable for the following components:   D-Dimer, Quant 6.59 (*)    All other components within normal limits  MAGNESIUM - Abnormal; Notable for the following components:   Magnesium 2.5 (*)    All other components within normal limits  URINALYSIS, ROUTINE W REFLEX MICROSCOPIC - Abnormal; Notable for the following components:   APPearance HAZY (*)    Hgb urine dipstick MODERATE (*)    All other components within normal limits  TROPONIN I (HIGH SENSITIVITY) - Abnormal; Notable for the following components:   Troponin I (High Sensitivity) 57 (*)    All other components within normal limits    EKG EKG Interpretation Date/Time:  Monday October 11 2023 06:42:44 EST Ventricular Rate:  60 PR Interval:  261 QRS Duration:  86 QT Interval:  419 QTC Calculation: 419 R Axis:   51  Text Interpretation: Age not entered, assumed to be  87 years old for purpose of ECG interpretation Sinus rhythm Prolonged PR interval Anterior infarct, old Confirmed by Eber Hong ([id number]) on 10/11/2023 7:05:15 AM  Radiology DG Chest Portable 1 View Result Date: 10/11/2023 CLINICAL DATA:  Shortness of breath. EXAM: PORTABLE CHEST 1 VIEW COMPARISON:  10/01/2023 FINDINGS: The lungs are clear without focal pneumonia, edema, pneumothorax or pleural effusion. Interstitial markings are diffusely coarsened with chronic features. The cardio pericardial silhouette is enlarged. No acute bony abnormality. Telemetry leads overlie the chest. IMPRESSION: Chronic interstitial coarsening without acute cardiopulmonary findings. Electronically Signed   By: Kennith Center M.D.   On: 10/11/2023 07:27    Procedures .Critical  Care  Performed by: Eber Hong, MD Authorized by: Eber Hong, MD   Critical care provider statement:    Critical care time (minutes):  45   Critical care time was exclusive of:  Separately billable procedures and treating other patients   Critical care was necessary to treat or prevent imminent or life-threatening deterioration of the following conditions:  Respiratory failure and renal failure   Critical care was time spent personally by me on the following activities:  Development of treatment plan with patient or surrogate, discussions with consultants, evaluation of patient's response to treatment, examination of patient, obtaining history from patient or surrogate, review of old charts, re-evaluation of patient's condition, pulse oximetry, ordering and review of radiographic studies, ordering and review of laboratory studies and ordering and performing treatments and interventions   I assumed direction of critical care for this patient from another provider in my specialty: no     Care discussed with: admitting provider   Comments:           Medications Ordered in ED Medications  albuterol (VENTOLIN HFA) 108 (90 Base) MCG/ACT inhaler 2 puff (has no administration in time range)  0.9 %  sodium chloride infusion ( Intravenous New Bag/Given 10/11/23 0854)    ED Course/ Medical Decision Making/ A&P Clinical Course as of 10/11/23 0954  Mon Oct 11, 2023  0722 Of note when the patient was in the hospital she was diagnosed with a urinary tract infection and discharged with an antibiotic, the patient states that she has been taking the medication [BM]  0731 DG Chest Portable 1 View [BM]    Clinical Course User Index [BM] Eber Hong, MD                                 Medical Decision Making Amount and/or Complexity of Data Reviewed Labs: ordered. Radiology: ordered.  Decision-making details documented in ED Course.  Risk Prescription drug management. Decision regarding  hospitalization.    This patient presents to the ED for concern of shortness of breath and weakness, this involves an extensive number of treatment options, and is a complaint that carries with it a high risk of complications and morbidity.  The differential diagnosis includes pneumonia after being in the hospital, could have been exposed to flu or COVID, could be considered to be cardiac however given her recent echocardiogram that would be unusual, she does have asymmetry of the legs, consider pulmonary embolism, she is not tachycardic or hypotensive   Co morbidities that complicate the patient evaluation  Severe hypertension   Additional history obtained:  Additional history obtained from medical record External records from outside source obtained and reviewed including recent admission to the hospital   Lab Tests:  I Ordered, and personally interpreted labs.  The pertinent results include: Renal failure present, this is acute on chronic, uremia, BNP severely elevated   Imaging Studies ordered:  I ordered imaging studies including chest x-ray I independently visualized and interpreted imaging which showed interstitial coarsening, no cardiopulmonary findings otherwise I agree with the radiologist interpretation   Cardiac Monitoring: / EKG:  The patient was maintained on a cardiac monitor.  I personally viewed and interpreted the cardiac monitored which showed an underlying rhythm of: Normal sinus rhythm, hypoxic   Consultations Obtained:  I requested consultation with the hospitalist Dr. Mariea Clonts,  and discussed lab and imaging findings as well as pertinent plan - they recommend: Admission to the hospital   Problem List / ED Course / Critical interventions / Medication management  New acute kidney failure with hypoxic respiratory failure without a known cause, VQ scan ordered as she cannot tolerate a contrast load because of contrast nephropathy in the presence of an acute  kidney injury I ordered medication including supplemental oxygen for hypoxia Reevaluation of the patient after these medicines showed that the patient critically ill I have reviewed the patients home medicines and have made adjustments as needed   Social Determinants of Health:  Acute hypoxic respiratory failure Recent admission for hypertensive urgency   Test / Admission - Considered:  Admit to higher level of care         Final Clinical Impression(s) / ED Diagnoses Final diagnoses:  Acute kidney injury (HCC)  Acute respiratory failure with hypoxia [facility])    Rx / DC Orders ED Discharge Orders     None         Eber Hong, MD 10/11/23 [phone]

## 2023-10-11 NOTE — H&P (Signed)
Patient Demographics:    Sherry Waller, is a 87 y.o. female  MRN: [id number]   DOB - 1936-05-22  Admit Date - 10/11/2023  Outpatient Primary MD for the patient is Assunta Found, MD   Assessment & Plan:   Assessment and Plan:  1)AKI----acute kidney injury on CKD stage -3B with metabolic acidosis--- --in the setting of loose stools and poor oral intake --Creatinine is up to 2.23, creatinine was 1.1 on 10/01/2023 -BUN is up to 67 from 17 on 10/01/2023 -Bicarb is 18 -Give gentle IV fluids renally adjust medications, avoid nephrotoxic agents / dehydration  / hypotension   2)FUO----patient with fevers and chills since 10/11/2023 -fever and chills developed somewhat productive cough with greenish-yellowish sputum -WBCs 10.0 down from 16.3 on 10/01/2023 --Chest x-ray without acute cardiopulmonary findings -UA is not suggestive of UTI---completed antibiotics for E. coli UTI recently -COVID, RSV and flu testing requested  3)-D-dimer elevated at 6.59 with acute hypoxic respiratory failure she was found to have O2 sats of 87% required 2 L of oxygen via nasal cannula -VQ scan low probably for PE -ABG without hypercapnia ,  -- hypoxia noted on ABG with PaO2 of 61,  -COVID, RSV and flu testing requested --BNP 1005 no prior---- -troponin is down to 57 was 210 on 10/01/2023, EKG sinus rhythm -- Continue supplemental oxygen and as needed bronchodilators  4)HypoNatremia---- sodium is 126 down from a baseline around 134 -In the setting of loose stools and poor oral intake -Hydrate IV and orally  5)Acute Anemia-----Hgb is down to 9.9 baseline usually between 13 and 14, -Patient denies any blood in the stool, no emesis -Check stool for occult blood -Anemia labs/workup requested  6)Malignant HTN--- patient has history  of stage II hypertension with difficult to control BP from time to time -Systolic blood pressure was around 200 mmhg in the ED suspect some component of rebound hypertension in the setting of clonidine use -Required nicardipine drip during the recent hospitalization -Continue Coreg, clonidine, Cardizem, isosorbide/hydralazine combo -IV labetalol as needed -- BP improved with IV labetalol and resumption of oral BP meds  7)History of Depression/Neuropathy -Continue treatment with Lexapro and Neurontin   8)Hypothyroidism----Continue Synthroid   9)DM2-A1c 6.6 reflecting good diabetic control PTA -Use Novolog/Humalog Sliding scale insulin with Accu-Cheks/Fingersticks as ordered   Dispo: The patient is from: Home              Anticipated d/c is to: Home              Anticipated d/c date is: 1 day              Patient currently is not medically stable to d/c. Barriers: Not Clinically Stable-   With History of - Reviewed by me  Past Medical History:  Diagnosis Date   Hypertension    Thyroid disease       Past Surgical History:  Procedure Laterality Date   ABDOMINAL HYSTERECTOMY  CHOLECYSTECTOMY     TONSILLECTOMY     Chief Complaint  Patient presents with   Shortness of Breath      HPI:    Sherry Waller  is a 87 y.o. female with past medical history significant of hypothyroidism, depression, malignant HTN, DM2, CKD 3B, and  GERD who was recently discharged from this hospital on on 10/03/2023 after treatment for hypertensive urgency and E. coli UTI -Patient returns to the ED on 10/11/2023 with lethargy generalized weakness and hypoxia -Additional history obtained from patient's son Tammy Sours at bedside---apparently she has been so weak that she has been urinating and having BMs in bed---reports loose stools, poor appetite, nausea but no vomiting---denies blood in stool -this morning she had fever and chills developed somewhat productive cough with greenish-yellowish sputum -No  chest pain palpitations, no pleuritic symptoms no increased shortness of breath --In the ED she was found to have O2 sats of 87% required 2 L of oxygen via nasal cannula -Chest x-ray without acute cardiopulmonary findings -D-dimer elevated at 6.59 -VQ scan low probably for PE -ABG without hypercapnia  --- hypoxia noted on ABG with PaO2 of 61,  bicarb 19 -UA is not suggestive of UTI -BNP 1005 no prior---- -troponin is down to 57 was 210 on 10/01/2023, EKG sinus rhythm -WBCs 10.0 down from 16.3 on 10/01/2023 -Hgb is down to 9.9 baseline usually between 13 and 14, platelets 268 -Magnesium is 2.5, sodium is 126 down from a baseline around 134 -Creatinine is up to 2.23, creatinine was 1.1 on 10/01/2023 -BUN is up to 67 from 17 on 10/01/2023   Review of systems:    In addition to the HPI above,   A full Review of  Systems was done, all other systems reviewed are negative except as noted above in HPI , .    Social History:  Reviewed by me    Social History   Tobacco Use   Smoking status: Never   Smokeless tobacco: Never  Substance Use Topics   Alcohol use: No     Family History :  Reviewed by me    Family History  Problem Relation Age of Onset   Brain cancer Father    Ovarian cancer Sister    Brain cancer Sister      Home Medications:   Prior to Admission medications   Medication Sig Start Date End Date Taking? Authorizing Provider  acetaminophen (TYLENOL) 500 MG tablet Take 2 tablets (1,000 mg total) by mouth every 8 (eight) hours as needed for mild pain (pain score 1-3), headache or fever. 10/03/23   Vassie Loll, MD  carvedilol (COREG) 3.125 MG tablet Take 1 tablet (3.125 mg total) by mouth 2 (two) times daily. 10/03/23   Vassie Loll, MD  cloNIDine (CATAPRES) 0.3 MG tablet Take 0.3 mg by mouth 3 (three) times daily.    [provider]  diltiazem (CARDIZEM CD) 240 MG 24 hr capsule Take 1 capsule (240 mg total) by mouth daily. 10/04/23   Vassie Loll,  MD  escitalopram (LEXAPRO) 10 MG tablet Take 10 mg by mouth daily. Take 1/2 a tablet by mouth once a day    [provider]  furosemide (LASIX) 20 MG tablet Take 1 tablet (20 mg total) by mouth daily. 10/03/23   Vassie Loll, MD  gabapentin (NEURONTIN) 100 MG capsule Take 100 mg by mouth daily.    [provider]  hydrALAZINE (APRESOLINE) 50 MG tablet Take 1 tablet (50 mg total) by mouth in the morning and  at bedtime. 10/03/23 01/01/24  Vassie Loll, MD  isosorbide mononitrate (IMDUR) 60 MG 24 hr tablet Take 1 tablet (60 mg total) by mouth daily. 10/04/23   Vassie Loll, MD  levothyroxine (SYNTHROID, LEVOTHROID) 50 MCG tablet Take 50 mcg by mouth daily before breakfast.    [provider]  olmesartan (BENICAR) 40 MG tablet Take 1 tablet (40 mg total) by mouth daily. 10/03/23   Vassie Loll, MD  pantoprazole (PROTONIX) 40 MG tablet Take 1 tablet (40 mg total) by mouth daily. 10/04/23   Vassie Loll, MD  traMADol (ULTRAM) 50 MG tablet Take 1 tablet (50 mg total) by mouth every 12 (twelve) hours as needed. 08/21/23   Leath-Warren, Sadie Haber, NP     Allergies:     Allergies  Allergen Reactions   Fenofibrate Other (See Comments)    Aches   Statins Itching   Sulfa Antibiotics Other (See Comments)    headahces     Physical Exam:   Vitals  Blood pressure (!) 147/51, pulse 61, temperature 99.7 F (37.6 C), temperature source Oral, resp. rate 18, height 5\' 5"  (1.651 m), weight 67 kg, SpO2 94%. Vitals:   10/11/23 1044 10/11/23 1434  BP: (!) 193/57 (!) 147/51  Pulse: (!) 59 61  Resp: 18   Temp: 99.7 F (37.6 C)   SpO2: 94%     Physical Examination: General appearance -sleepy, but arousable,  in no distress Mental status - alert, oriented to person, place, and time,  Eyes - sclera anicteric Neck - supple, +ve JVD elevation , Chest - clear  to auscultation bilaterally, symmetrical air movement,  Heart - S1 and S2 normal, regular  Abdomen - soft,  nontender, nondistended, +BS Neurological - screening mental status exam normal, neck supple without rigidity, cranial nerves II through XII intact, DTR's normal and symmetric Extremities - no significant pedal edema noted, right leg exam without significant swelling, negative Homans , intact peripheral pulses  Skin - warm, dry    Data Review:    CBC Recent Labs  Lab 10/11/23 0723  WBC 10.0  HGB 9.9*  HCT 29.8*  PLT 268  MCV 89.5  MCH 29.7  MCHC 33.2  RDW 13.6  LYMPHSABS 0.7  MONOABS 0.8  EOSABS 0.3  BASOSABS 0.1   ------------------------------------------------------------------------------------------------------------------  Chemistries  Recent Labs  Lab 10/11/23 0640  NA 126*  K 4.9  CL 98  CO2 18*  GLUCOSE 175*  BUN 67*  CREATININE 2.23*  CALCIUM 8.8*  MG 2.5*  AST 36  ALT 25  ALKPHOS 126  BILITOT 0.8   ------------------------------------------------------------------------------------------------------------------ estimated creatinine clearance is 16.6 mL/min (A) (by C-G formula based on SCr of 2.23 mg/dL (H)). ------------------------------------------------------------------------------------------------------------------ ------------------------------------------------------------------------------------------------------------------- Recent Labs    10/11/23 0640  DDIMER 6.59*   ------------------------------------------------------------------------------------------------------------------------------------------------------------------------------------------------------------------------------------    Component Value Date/Time   BNP 1,005.0 (H) 10/11/2023 0640   Urinalysis    Component Value Date/Time   COLORURINE YELLOW 10/11/2023 0839   APPEARANCEUR HAZY (A) 10/11/2023 0839   LABSPEC 1.009 10/11/2023 0839   PHURINE 5.0 10/11/2023 0839   GLUCOSEU NEGATIVE 10/11/2023 0839   HGBUR MODERATE (A) 10/11/2023 0839   BILIRUBINUR NEGATIVE  10/11/2023 0839   KETONESUR NEGATIVE 10/11/2023 0839   PROTEINUR NEGATIVE 10/11/2023 0839   NITRITE NEGATIVE 10/11/2023 0839   LEUKOCYTESUR NEGATIVE 10/11/2023 0839   ---------------------------------------------------------------------------------------------------------------   Imaging Results:    NM Pulmonary Perfusion Result Date: 10/11/2023 CLINICAL DATA:  High prob PE suspected, shortness of breath times weeks EXAM: NUCLEAR MEDICINE PERFUSION LUNG SCAN TECHNIQUE:  Perfusion images were obtained in multiple projections after intravenous injection of radiopharmaceutical. Ventilation scans intentionally deferred if perfusion scan and chest x-ray adequate for interpretation during COVID 19 epidemic. RADIOPHARMACEUTICALS:  4.4 mCi Tc-27m MAA IV COMPARISON:  Portable chest radiograph from the same day FINDINGS: Heterogenous bilateral distribution of radiopharmaceutical. No discrete segmental or subsegmental perfusion defects. IMPRESSION: Low likelihood ratio for pulmonary embolism. Electronically Signed   By: Corlis Leak M.D.   On: 10/11/2023 13:37   DG Chest Portable 1 View Result Date: 10/11/2023 CLINICAL DATA:  Shortness of breath. EXAM: PORTABLE CHEST 1 VIEW COMPARISON:  10/01/2023 FINDINGS: The lungs are clear without focal pneumonia, edema, pneumothorax or pleural effusion. Interstitial markings are diffusely coarsened with chronic features. The cardio pericardial silhouette is enlarged. No acute bony abnormality. Telemetry leads overlie the chest. IMPRESSION: Chronic interstitial coarsening without acute cardiopulmonary findings. Electronically Signed   By: Kennith Center M.D.   On: 10/11/2023 07:27    Radiological Exams on Admission: NM Pulmonary Perfusion Result Date: 10/11/2023 CLINICAL DATA:  High prob PE suspected, shortness of breath times weeks EXAM: NUCLEAR MEDICINE PERFUSION LUNG SCAN TECHNIQUE: Perfusion images were obtained in multiple projections after intravenous injection of  radiopharmaceutical. Ventilation scans intentionally deferred if perfusion scan and chest x-ray adequate for interpretation during COVID 19 epidemic. RADIOPHARMACEUTICALS:  4.4 mCi Tc-12m MAA IV COMPARISON:  Portable chest radiograph from the same day FINDINGS: Heterogenous bilateral distribution of radiopharmaceutical. No discrete segmental or subsegmental perfusion defects. IMPRESSION: Low likelihood ratio for pulmonary embolism. Electronically Signed   By: Corlis Leak M.D.   On: 10/11/2023 13:37   DG Chest Portable 1 View Result Date: 10/11/2023 CLINICAL DATA:  Shortness of breath. EXAM: PORTABLE CHEST 1 VIEW COMPARISON:  10/01/2023 FINDINGS: The lungs are clear without focal pneumonia, edema, pneumothorax or pleural effusion. Interstitial markings are diffusely coarsened with chronic features. The cardio pericardial silhouette is enlarged. No acute bony abnormality. Telemetry leads overlie the chest. IMPRESSION: Chronic interstitial coarsening without acute cardiopulmonary findings. Electronically Signed   By: Kennith Center M.D.   On: 10/11/2023 07:27    DVT Prophylaxis -SCD /heparin AM Labs Ordered, also please review Full Orders  Family Communication: Admission, patients condition and plan of care including tests being ordered have been discussed with the patient and son Tammy Sours who indicate understanding and agree with the plan   Condition   stable  Shon Hale M.D on 10/11/2023 at 2:40 PM Go to [email or web address] -  for contact info  [facility] - Office  [phone]

## 2023-10-11 NOTE — Plan of Care (Signed)

## 2023-10-11 NOTE — ED Notes (Signed)
Provider instructed to maintain o2 without supplement above 88%

## 2023-10-11 NOTE — ED Triage Notes (Signed)
Pt bib EMS for c/o sob. EMS reports O2 sats of 87% on R/A. EMS placed pt on 4L [place] and sats improved to 97%. Pt reportedly discharged from hospital on Sunday. Pt states she has been weak since discharge, had a cough, and decreased appetite.

## 2023-10-11 NOTE — Progress Notes (Signed)
Patient bladder scanned by nurse tech Terri, noted, MD bedside and aware, wants to hold off on in and out at this time due to patient being lethargic, wants patient to wake up and attempt to urinate, will continue to monitor.

## 2023-10-11 NOTE — ED Notes (Signed)
300 requested pt have BP medication before transport. Charge and MD aware.

## 2023-10-11 NOTE — ED Notes (Signed)
ED TO INPATIENT HANDOFF REPORT  ED Nurse Name and Phone #: [phone]  S Name/Age/Gender Sherry Waller 87 y.o. female Room/Bed: APA10/APA10  Code Status   Code Status: Prior  Home/SNF/Other Home Patient oriented to: self, place, time, and situation Is this baseline? Yes   Triage Complete: Triage complete  Chief Complaint Acute respiratory failure with hypoxia (HCC) [J96.01]  Triage Note Pt bib EMS for c/o sob. EMS reports O2 sats of 87% on R/A. EMS placed pt on 4L [place] and sats improved to 97%. Pt reportedly discharged from hospital on Sunday. Pt states she has been weak since discharge, had a cough, and decreased appetite.    Allergies Allergies  Allergen Reactions   Fenofibrate Other (See Comments)    Aches   Statins Itching   Sulfa Antibiotics Other (See Comments)    headahces    Level of Care/Admitting Diagnosis ED Disposition     ED Disposition  Admit   Condition  --   Comment  Hospital Area: [facility] [[id number]]  Level of Care: Telemetry [5]  Covid Evaluation: Asymptomatic - no recent exposure (last 10 days) testing not required  Diagnosis: Acute respiratory failure with hypoxia [facility]) [[id number]]  Admitting Physician: Marylyn Ishihara  Attending Physician: Marylyn Ishihara          B Medical/Surgery History Past Medical History:  Diagnosis Date   Hypertension    Thyroid disease    Past Surgical History:  Procedure Laterality Date   ABDOMINAL HYSTERECTOMY     CHOLECYSTECTOMY     TONSILLECTOMY       A IV Location/Drains/Wounds Patient Lines/Drains/Airways Status     Active Line/Drains/Airways     Name Placement date Placement time Site Days   Peripheral IV 10/11/23 18 G Left Antecubital 10/11/23  0630  Antecubital  less than 1            Intake/Output Last 24 hours No intake or output data in the 24 hours ending 10/11/23 8295  Labs/Imaging Results for orders placed or performed during the hospital encounter  of 10/11/23 (from the past 48 hours)  Comprehensive metabolic panel     Status: Abnormal   Collection Time: 10/11/23  6:40 AM  Result Value Ref Range   Sodium 126 (L) 135 - 145 mmol/L   Potassium 4.9 3.5 - 5.1 mmol/L   Chloride 98 98 - 111 mmol/L   CO2 18 (L) 22 - 32 mmol/L   Glucose, Bld 175 (H) 70 - 99 mg/dL    Comment: Glucose reference range applies only to samples taken after fasting for at least 8 hours.   BUN 67 (H) 8 - 23 mg/dL   Creatinine, Ser 6.21 (H) 0.44 - 1.00 mg/dL   Calcium 8.8 (L) 8.9 - 10.3 mg/dL   Total Protein 6.5 6.5 - 8.1 g/dL   Albumin 2.9 (L) 3.5 - 5.0 g/dL   AST 36 15 - 41 U/L   ALT 25 0 - 44 U/L   Alkaline Phosphatase 126 38 - 126 U/L   Total Bilirubin 0.8 <1.2 mg/dL   GFR, Estimated 21 (L) >60 mL/min    Comment: (NOTE) Calculated using the CKD-EPI Creatinine Equation (2021)    Anion gap 10 5 - 15    Comment: Performed at [facility], [address] [place], [place] [address]  Troponin I (High Sensitivity)     Status: Abnormal   Collection Time: 10/11/23  6:40 AM  Result Value Ref Range   Troponin I (High Sensitivity)  57 (H) <18 ng/L    Comment: (NOTE) Elevated high sensitivity troponin I (hsTnI) values and significant  changes across serial measurements may suggest ACS but many other  chronic and acute conditions are known to elevate hsTnI results.  Refer to the "Links" section for chest pain algorithms and additional  guidance. Performed at [facility], [address], [place], [place] [address]   Brain natriuretic peptide     Status: Abnormal   Collection Time: 10/11/23  6:40 AM  Result Value Ref Range   B Natriuretic Peptide 1,005.0 (H) 0.0 - 100.0 pg/mL    Comment: Performed at [facility], [address] [place], [place] [address]  D-dimer, quantitative     Status: Abnormal   Collection Time: 10/11/23  6:40 AM  Result Value Ref Range   D-Dimer, Quant 6.59 (H) 0.00 - 0.50 ug/mL-FEU    Comment: (NOTE) At the manufacturer cut-off value of  0.5 g/mL FEU, this assay has a negative predictive value of 95-100%.This assay is intended for use in conjunction with a clinical pretest probability (PTP) assessment model to exclude pulmonary embolism (PE) and deep venous thrombosis (DVT) in outpatients suspected of PE or DVT. Results should be correlated with clinical presentation. Performed at [facility], [address], [place], [place] [address]   Magnesium     Status: Abnormal   Collection Time: 10/11/23  6:40 AM  Result Value Ref Range   Magnesium 2.5 (H) 1.7 - 2.4 mg/dL    Comment: Performed at [facility], [address], [place], [place] [address]  CBC with Differential     Status: Abnormal   Collection Time: 10/11/23  7:23 AM  Result Value Ref Range   WBC 10.0 4.0 - 10.5 K/uL   RBC 3.33 (L) 3.87 - 5.11 MIL/uL   Hemoglobin 9.9 (L) 12.0 - 15.0 g/dL   HCT 13.0 (L) 86.5 - 78.4 %   MCV 89.5 80.0 - 100.0 fL   MCH 29.7 26.0 - 34.0 pg   MCHC 33.2 30.0 - 36.0 g/dL   RDW 69.6 29.5 - 28.4 %   Platelets 268 150 - 400 K/uL   nRBC 0.0 0.0 - 0.2 %   Neutrophils Relative % 80 %   Neutro Abs 8.0 (H) 1.7 - 7.7 K/uL   Lymphocytes Relative 7 %   Lymphs Abs 0.7 0.7 - 4.0 K/uL   Monocytes Relative 8 %   Monocytes Absolute 0.8 0.1 - 1.0 K/uL   Eosinophils Relative 3 %   Eosinophils Absolute 0.3 0.0 - 0.5 K/uL   Basophils Relative 1 %   Basophils Absolute 0.1 0.0 - 0.1 K/uL   Immature Granulocytes 1 %   Abs Immature Granulocytes 0.05 0.00 - 0.07 K/uL    Comment: Performed at [facility], [address] [place], [place] [address]  Urinalysis, Routine w reflex microscopic -Urine, Clean Catch     Status: Abnormal   Collection Time: 10/11/23  8:39 AM  Result Value Ref Range   Color, Urine YELLOW YELLOW   APPearance HAZY (A) CLEAR   Specific Gravity, Urine 1.009 1.005 - 1.030   pH 5.0 5.0 - 8.0   Glucose, UA NEGATIVE NEGATIVE mg/dL   Hgb urine dipstick MODERATE (A) NEGATIVE   Bilirubin Urine NEGATIVE NEGATIVE   Ketones, ur NEGATIVE  NEGATIVE mg/dL   Protein, ur NEGATIVE NEGATIVE mg/dL   Nitrite NEGATIVE NEGATIVE   Leukocytes,Ua NEGATIVE NEGATIVE   RBC / HPF 0-5 0 - 5 RBC/hpf   WBC, UA 0-5 0 -  5 WBC/hpf   Bacteria, UA NONE SEEN NONE SEEN   Squamous Epithelial / HPF 6-10 0 - 5 /HPF   Hyaline Casts, UA PRESENT     Comment: Performed at [facility], [address] [place], [place] [address]   DG Chest Portable 1 View Result Date: 10/11/2023 CLINICAL DATA:  Shortness of breath. EXAM: PORTABLE CHEST 1 VIEW COMPARISON:  10/01/2023 FINDINGS: The lungs are clear without focal pneumonia, edema, pneumothorax or pleural effusion. Interstitial markings are diffusely coarsened with chronic features. The cardio pericardial silhouette is enlarged. No acute bony abnormality. Telemetry leads overlie the chest. IMPRESSION: Chronic interstitial coarsening without acute cardiopulmonary findings. Electronically Signed   By: Kennith Center M.D.   On: 10/11/2023 07:27    Pending Labs Unresulted Labs (From admission, onward)    None       Vitals/Pain Today's Vitals   10/11/23 0636 10/11/23 0637 10/11/23 0638  BP:   (!) 172/51  Pulse:   61  Resp:   20  Temp:   99.3 F (37.4 C)  TempSrc:   Oral  SpO2:   95%  Weight:  67 kg   Height:  5\' 5"  (1.651 m)   PainSc: 0-No pain      Isolation Precautions No active isolations  Medications Medications  albuterol (VENTOLIN HFA) 108 (90 Base) MCG/ACT inhaler 2 puff (has no administration in time range)  0.9 %  sodium chloride infusion ( Intravenous New Bag/Given 10/11/23 0854)    Mobility Normally walks with walker. Has not been able to ambulate recently due to weakness.      Focused Assessments    R Recommendations: See Admitting Provider Note  Report given to:   Additional Notes:

## 2023-10-12 ENCOUNTER — Observation Stay (HOSPITAL_COMMUNITY): Payer: Medicare HMO

## 2023-10-12 DIAGNOSIS — Z515 Encounter for palliative care: Secondary | ICD-10-CM | POA: Diagnosis not present

## 2023-10-12 DIAGNOSIS — E872 Acidosis, unspecified: Secondary | ICD-10-CM | POA: Diagnosis present

## 2023-10-12 DIAGNOSIS — E875 Hyperkalemia: Secondary | ICD-10-CM | POA: Diagnosis present

## 2023-10-12 DIAGNOSIS — J9601 Acute respiratory failure with hypoxia: Secondary | ICD-10-CM | POA: Diagnosis present

## 2023-10-12 DIAGNOSIS — E538 Deficiency of other specified B group vitamins: Secondary | ICD-10-CM | POA: Diagnosis present

## 2023-10-12 DIAGNOSIS — N1831 Chronic kidney disease, stage 3a: Secondary | ICD-10-CM | POA: Diagnosis not present

## 2023-10-12 DIAGNOSIS — N170 Acute kidney failure with tubular necrosis: Secondary | ICD-10-CM | POA: Diagnosis present

## 2023-10-12 DIAGNOSIS — D509 Iron deficiency anemia, unspecified: Secondary | ICD-10-CM | POA: Diagnosis present

## 2023-10-12 DIAGNOSIS — J690 Pneumonitis due to inhalation of food and vomit: Secondary | ICD-10-CM | POA: Diagnosis not present

## 2023-10-12 DIAGNOSIS — E861 Hypovolemia: Secondary | ICD-10-CM | POA: Diagnosis present

## 2023-10-12 DIAGNOSIS — Z7189 Other specified counseling: Secondary | ICD-10-CM | POA: Diagnosis not present

## 2023-10-12 DIAGNOSIS — Z6830 Body mass index (BMI) 30.0-30.9, adult: Secondary | ICD-10-CM | POA: Diagnosis not present

## 2023-10-12 DIAGNOSIS — E1140 Type 2 diabetes mellitus with diabetic neuropathy, unspecified: Secondary | ICD-10-CM | POA: Diagnosis present

## 2023-10-12 DIAGNOSIS — N1832 Chronic kidney disease, stage 3b: Secondary | ICD-10-CM | POA: Diagnosis present

## 2023-10-12 DIAGNOSIS — E669 Obesity, unspecified: Secondary | ICD-10-CM | POA: Diagnosis present

## 2023-10-12 DIAGNOSIS — R001 Bradycardia, unspecified: Secondary | ICD-10-CM | POA: Diagnosis not present

## 2023-10-12 DIAGNOSIS — E039 Hypothyroidism, unspecified: Secondary | ICD-10-CM | POA: Diagnosis present

## 2023-10-12 DIAGNOSIS — N179 Acute kidney failure, unspecified: Secondary | ICD-10-CM | POA: Diagnosis present

## 2023-10-12 DIAGNOSIS — R0602 Shortness of breath: Secondary | ICD-10-CM | POA: Diagnosis present

## 2023-10-12 DIAGNOSIS — G9341 Metabolic encephalopathy: Secondary | ICD-10-CM | POA: Diagnosis not present

## 2023-10-12 DIAGNOSIS — Z1152 Encounter for screening for COVID-19: Secondary | ICD-10-CM | POA: Diagnosis not present

## 2023-10-12 DIAGNOSIS — Z79899 Other long term (current) drug therapy: Secondary | ICD-10-CM | POA: Diagnosis not present

## 2023-10-12 DIAGNOSIS — Z66 Do not resuscitate: Secondary | ICD-10-CM | POA: Diagnosis not present

## 2023-10-12 DIAGNOSIS — I255 Ischemic cardiomyopathy: Secondary | ICD-10-CM | POA: Diagnosis present

## 2023-10-12 DIAGNOSIS — I129 Hypertensive chronic kidney disease with stage 1 through stage 4 chronic kidney disease, or unspecified chronic kidney disease: Secondary | ICD-10-CM | POA: Diagnosis present

## 2023-10-12 DIAGNOSIS — E871 Hypo-osmolality and hyponatremia: Secondary | ICD-10-CM | POA: Diagnosis present

## 2023-10-12 DIAGNOSIS — F32A Depression, unspecified: Secondary | ICD-10-CM | POA: Diagnosis present

## 2023-10-12 DIAGNOSIS — I160 Hypertensive urgency: Secondary | ICD-10-CM | POA: Diagnosis not present

## 2023-10-12 DIAGNOSIS — E8770 Fluid overload, unspecified: Secondary | ICD-10-CM | POA: Diagnosis not present

## 2023-10-12 DIAGNOSIS — E1122 Type 2 diabetes mellitus with diabetic chronic kidney disease: Secondary | ICD-10-CM | POA: Diagnosis present

## 2023-10-12 DIAGNOSIS — K219 Gastro-esophageal reflux disease without esophagitis: Secondary | ICD-10-CM | POA: Diagnosis present

## 2023-10-12 DIAGNOSIS — I10 Essential (primary) hypertension: Secondary | ICD-10-CM | POA: Diagnosis not present

## 2023-10-12 LAB — BASIC METABOLIC PANEL
Anion gap: 9 (ref 5–15)
BUN: 69 mg/dL — ABNORMAL HIGH (ref 8–23)
CO2: 16 mmol/L — ABNORMAL LOW (ref 22–32)
Calcium: 8.4 mg/dL — ABNORMAL LOW (ref 8.9–10.3)
Chloride: 102 mmol/L (ref 98–111)
Creatinine, Ser: 2.52 mg/dL — ABNORMAL HIGH (ref 0.44–1.00)
GFR, Estimated: 18 mL/min — ABNORMAL LOW (ref >60)
Glucose, Bld: 158 mg/dL — ABNORMAL HIGH (ref 70–99)
Potassium: 5.3 mmol/L — ABNORMAL HIGH (ref 3.5–5.1)
Sodium: 127 mmol/L — ABNORMAL LOW (ref 135–145)

## 2023-10-12 LAB — GLUCOSE, CAPILLARY
Glucose-Capillary: 157 mg/dL — ABNORMAL HIGH (ref 70–99)
Glucose-Capillary: 160 mg/dL — ABNORMAL HIGH (ref 70–99)
Glucose-Capillary: 150 mg/dL — ABNORMAL HIGH (ref 70–99)
Glucose-Capillary: 182 mg/dL — ABNORMAL HIGH (ref 70–99)

## 2023-10-12 LAB — VITAMIN B12: Vitamin B-12: 136 pg/mL — ABNORMAL LOW (ref 180–914)

## 2023-10-12 LAB — FOLATE: Folate: 16.6 ng/mL (ref >5.9)

## 2023-10-12 LAB — CK: Total CK: 405 U/L — ABNORMAL HIGH (ref 38–234)

## 2023-10-12 MED ORDER — FERROUS SULFATE 325 (65 FE) MG PO TABS
325.0000 mg | ORAL_TABLET | Freq: Every day | ORAL | Status: DC
  Administered 2023-10-12 – 2023-10-23 (×11): 325 mg via ORAL
  Filled 2023-10-12 (×12): qty 1

## 2023-10-12 MED ORDER — CYANOCOBALAMIN 1000 MCG/ML IJ SOLN
1000.0000 ug | Freq: Once | INTRAMUSCULAR | Status: AC
  Administered 2023-10-12: 1000 ug via INTRAMUSCULAR
  Filled 2023-10-12: qty 1

## 2023-10-12 MED ORDER — SODIUM BICARBONATE 8.4 % IV SOLN
INTRAVENOUS | Status: AC
Start: 2023-10-12 — End: 2023-10-13
  Filled 2023-10-12 (×2): qty 1000

## 2023-10-12 MED ORDER — SODIUM CHLORIDE 0.9% FLUSH
3.0000 mL | Freq: Two times a day (BID) | INTRAVENOUS | Status: DC
  Administered 2023-10-12: 10 mL via INTRAVENOUS
  Administered 2023-10-12 – 2023-10-14 (×3): 3 mL via INTRAVENOUS
  Administered 2023-10-15: 5 mL via INTRAVENOUS
  Administered 2023-10-15 – 2023-10-17 (×4): 10 mL via INTRAVENOUS
  Administered 2023-10-17: 5 mL via INTRAVENOUS
  Administered 2023-10-18: 10 mL via INTRAVENOUS
  Administered 2023-10-19: 5 mL via INTRAVENOUS
  Administered 2023-10-19: 10 mL via INTRAVENOUS
  Administered 2023-10-20: 3 mL via INTRAVENOUS
  Administered 2023-10-20 – 2023-10-22 (×4): 10 mL via INTRAVENOUS
  Administered 2023-10-22 – 2023-10-23 (×2): 3 mL via INTRAVENOUS

## 2023-10-12 MED ORDER — SODIUM ZIRCONIUM CYCLOSILICATE 5 G PO PACK
5.0000 g | PACK | Freq: Every day | ORAL | Status: AC
  Administered 2023-10-12 – 2023-10-13 (×2): 5 g via ORAL
  Filled 2023-10-12 (×2): qty 1

## 2023-10-12 MED ORDER — SODIUM CHLORIDE 0.9% FLUSH
3.0000 mL | INTRAVENOUS | Status: DC | PRN
  Administered 2023-10-19: 10 mL via INTRAVENOUS

## 2023-10-12 NOTE — Consult Note (Signed)
 Sherry Waller  Requesting Physician:  Pearlean MD  Reason for Consult:  AKI, SOB HPI:  64F presented yesterday to the ED with progressive dyspnea and weakness after recent admission for UTI and hypertensive emergency.  PMH Incudes: CKD 3a with subnephrotic proteinuria (UP/C <1) on ARB followed by Rachele MD. has established left renal atrophy likely from ischemic cardiomyopathy.  Baseline creatinine 1.1 Resistant hypertension on 7 agents GERD on PPI therapy DM2  Patient admitted to [facility] 12/20 - 10/03/23 and treated for E. coli UTI with cefadroxil  upon discharge.  Blood pressures are managed at that time.  Creatinine during that time was 1.1-1.3.  She returned to the ED yesterday as above.  She endorsed anorexia with poor oral intake and nausea/vomiting.  She denies diarrhea.  Her son notes that she has been more confused and lethargic.  No use of NSAIDs.  She had continued taking her blood pressure medications including the olmesartan .  Creatinine as below: Creatinine, Ser (mg/dL)  Date Value  87/68/7975 2.52 (H)  10/11/2023 2.23 (H)  10/03/2023 1.28 (H)  10/02/2023 1.33 (H)  10/01/2023 1.10 (H)  05/23/2018 1.15 (H)  08/16/2013 1.15 (H)  05/16/2009 1.03   Additionally, today her serum bicarbonate is 16 with an anion gap of 9.  K5.3 and sodium of 127.  BNP was elevated at 1000 comparator's.  Hemoglobin had decreased from 13 at discharge to 9.9 with an MCV of 89.5.  TTE 10/02/2023 with LVEF 65 to 70%, normal RV systolic function.  Mild to moderate mitral regurgitation present.  Chest x-ray at presentation with no acute findings but chronic interstitial coarsening present.  VQ scan 12/30 low probability for PE.  UA yesterday with moderate hemoglobin, no RBCs on microscopy, no pyuria, no proteinuria.  Testing for influenza, RSV, COVID-19 negative.  Patient hydrated and remains on normal saline at 100 mL/h.  UOP reported of 0.4 L since  admission using a purewick.  I/Os: I/O last 3 completed shifts: In: 2094.7 [P.O.:100; I.V.:1994.7] Out: 400 [Urine:400]   ROS NSAIDS: Denies use IV Contrast no exposure TMP/SMX no exposure Hypotension Not present Balance of 12 systems is negative w/ exceptions as above  PMH  Past Medical History:  Diagnosis Date   Hypertension    Thyroid  disease    PSH  Past Surgical History:  Procedure Laterality Date   ABDOMINAL HYSTERECTOMY     CHOLECYSTECTOMY     TONSILLECTOMY     FH  Family History  Problem Relation Age of Onset   Brain cancer Father    Ovarian cancer Sister    Brain cancer Sister    SH  reports that she has never smoked. She has never used smokeless tobacco. She reports that she does not drink alcohol  and does not use drugs. Allergies  Allergies  Allergen Reactions   Fenofibrate Other (See Comments)    Aches   Statins Itching   Sulfa Antibiotics Other (See Comments)    headahces   Home medications Prior to Admission medications   Medication Sig Start Date End Date Taking? Authorizing Provider  acetaminophen  (TYLENOL ) 500 MG tablet Take 2 tablets (1,000 mg total) by mouth every 8 (eight) hours as needed for mild pain (pain score 1-3), headache or fever. 10/03/23   Ricky Fines, MD  carvedilol  (COREG ) 3.125 MG tablet Take 1 tablet (3.125 mg total) by mouth 2 (two) times daily. 10/03/23   Ricky Fines, MD  cloNIDine  (CATAPRES ) 0.3 MG tablet Take 0.3 mg by  mouth 3 (three) times daily.    [provider]  diltiazem  (CARDIZEM  CD) 240 MG 24 hr capsule Take 1 capsule (240 mg total) by mouth daily. 10/04/23   Ricky Fines, MD  escitalopram  (LEXAPRO ) 10 MG tablet Take 10 mg by mouth daily. Take 1/2 a tablet by mouth once a day    [provider]  furosemide  (LASIX ) 20 MG tablet Take 1 tablet (20 mg total) by mouth daily. 10/03/23   Ricky Fines, MD  gabapentin  (NEURONTIN ) 100 MG capsule Take 100 mg by mouth daily.    [provider]   hydrALAZINE  (APRESOLINE ) 50 MG tablet Take 1 tablet (50 mg total) by mouth in the morning and at bedtime. 10/03/23 01/01/24  Ricky Fines, MD  isosorbide  mononitrate (IMDUR ) 60 MG 24 hr tablet Take 1 tablet (60 mg total) by mouth daily. 10/04/23   Ricky Fines, MD  levothyroxine  (SYNTHROID , LEVOTHROID) 50 MCG tablet Take 50 mcg by mouth daily before breakfast.    [provider]  olmesartan  (BENICAR ) 40 MG tablet Take 1 tablet (40 mg total) by mouth daily. 10/03/23   Ricky Fines, MD  pantoprazole  (PROTONIX ) 40 MG tablet Take 1 tablet (40 mg total) by mouth daily. 10/04/23   Ricky Fines, MD  traMADol  (ULTRAM ) 50 MG tablet Take 1 tablet (50 mg total) by mouth every 12 (twelve) hours as needed. 08/21/23   Leath-Warren, Etta PARAS, NP    Current Medications Scheduled Meds:  carvedilol   12.5 mg Oral BID WC   cloNIDine   0.3 mg Oral TID   diltiazem   240 mg Oral Daily   escitalopram   5 mg Oral Daily   gabapentin   100 mg Oral Daily   heparin   5,000 Units Subcutaneous Q8H   hydrALAZINE   100 mg Oral TID   insulin  aspart  0-5 Units Subcutaneous QHS   insulin  aspart  0-6 Units Subcutaneous TID WC   isosorbide  mononitrate  60 mg Oral Daily   levothyroxine   50 mcg Oral QAC breakfast   pantoprazole   40 mg Oral Daily   sodium chloride  flush  3 mL Intravenous Q12H   sodium chloride  flush  3 mL Intravenous Q12H   sodium chloride  flush  3-10 mL Intravenous Q12H   Continuous Infusions:  sodium chloride      sodium bicarbonate  150 mEq in dextrose  5 % 1,150 mL infusion     PRN Meds:.sodium chloride , acetaminophen , albuterol , bisacodyl , ondansetron  **OR** ondansetron  (ZOFRAN ) IV, oxyCODONE , polyethylene glycol, sodium chloride  flush, sodium chloride  flush, traZODone   CBC Recent Labs  Lab 10/11/23 0723  WBC 10.0  NEUTROABS 8.0*  HGB 9.9*  HCT 29.8*  MCV 89.5  PLT 268   Basic Metabolic Panel Recent Labs  Lab 10/11/23 0640 10/12/23 0412  NA 126* 127*  K 4.9 5.3*  CL 98 102   CO2 18* 16*  GLUCOSE 175* 158*  BUN 67* 69*  CREATININE 2.23* 2.52*  CALCIUM 8.8* 8.4*    Physical Exam  Blood pressure (!) 165/55, pulse (!) 54, temperature 98.4 F (36.9 C), resp. rate 16, height 5' 5 (1.651 m), weight 67 kg, SpO2 95%. GEN: Elderly female, unwell appearing ENT: NCAT EYES: EOMI CV: Regular, bradycardic, no rub or murmur PULM: Diminished in the bases with some bronchial breath sounds and faint crackles, normal work of breathing ABD: Soft, nontender, borborygmi present SKIN: No rashes, lesions, petechia, purpura EXT: No peripheral edema  Assessment 64F here with AoCKD3, NAGMA, hyponatremia, mild hyperkalemia after recent admission for UTI, with dyspnea and fatigue.  AoCKD3, oliguric?  Likely solitary right kidney with chronic left renal atrophy Chronic subnephrotic proteinuria Resistant hypertension on 7 agents as an outpatient including olmesartan  NAGMA Mild hyponatremia Mild hyperkalemia New anemia with hemoglobin of 9.9 from a baseline of 13, normocytic with an iron saturation of 8% and ferritin of 127 Hemoglobinuria without hematuria  Plan There has been some concern about volume status.  She does have some evidence of venous distention and an elevated BNP but a reassuring echocardiogram just this month.  Additionally clinical presentation is more consistent with hypovolemia.  Most likely explanation for her AKI is hypovolemia while using ARB in the setting of CKD with potential evolution to ATN.  Needs CK to exclude rhabdomyolysis.  Given recent antibiotics exposure AIN is possible but likely offending agent has already been withdrawn and role of kidney biopsy is limited because of solitary right functioning kidney.  Cause of dyspnea is unclear.  I wonder if she could have a viral URI outside of what has been tested for.  Change NS to D5+ 150mEq NaHCO3 at 18mL/h U Na and Cr Check CK Continue careful monitoring of her volume status Would hold gabapentin   because of her confusion Continue to hold olmesartan  Daily weights, Daily Renal Panel, Strict I/Os, Avoid nephrotoxins (NSAIDs, judicious IV Contrast) WIll follow closely  Bernardino KATHEE Waller  10/12/2023, 9:29 AM

## 2023-10-12 NOTE — Plan of Care (Signed)

## 2023-10-12 NOTE — Consult Note (Signed)
 Sherry Waller Admit Date: 10/11/2023 10/12/2023 Bernardino KATHEE Gasman  Requesting Physician:  Pearlean MD  Reason for Consult:  AKI, SOB HPI:  11F presented yesterday to the ED with progressive dyspnea and weakness after recent admission for UTI and hypertensive emergency.  PMH Incudes: CKD 3a with subnephrotic proteinuria (UP/C <1) on ARB followed by Rachele MD. has established left renal atrophy likely from ischemic cardiomyopathy.  Baseline creatinine 1.1 Resistant hypertension on 7 agents GERD on PPI therapy DM2  Patient admitted to [facility] 12/20 - 10/03/23 and treated for E. coli UTI with cefadroxil  upon discharge.  Blood pressures are managed at that time.  Creatinine during that time was 1.1-1.3.  She returned to the ED yesterday as above.  She endorsed anorexia with poor oral intake and nausea/vomiting.  She denies diarrhea.  Her son notes that she has been more confused and lethargic.  No use of NSAIDs.  She had continued taking her blood pressure medications including the olmesartan .  Creatinine as below: Creatinine, Ser (mg/dL)  Date Value  87/68/7975 2.52 (H)  10/11/2023 2.23 (H)  10/03/2023 1.28 (H)  10/02/2023 1.33 (H)  10/01/2023 1.10 (H)  05/23/2018 1.15 (H)  08/16/2013 1.15 (H)  05/16/2009 1.03   Additionally, today her serum bicarbonate is 16 with an anion gap of 9.  K5.3 and sodium of 127.  BNP was elevated at 1000 comparator's.  Hemoglobin had decreased from 13 at discharge to 9.9 with an MCV of 89.5.  TTE 10/02/2023 with LVEF 65 to 70%, normal RV systolic function.  Mild to moderate mitral regurgitation present.  Chest x-ray at presentation with no acute findings but chronic interstitial coarsening present.  VQ scan 12/30 low probability for PE.  UA yesterday with moderate hemoglobin, no RBCs on microscopy, no pyuria, no proteinuria.  Testing for influenza, RSV, COVID-19 negative.  Patient hydrated and remains on normal saline at 100 mL/h.  UOP reported of 0.4 L since  admission using a purewick.  I/Os: I/O last 3 completed shifts: In: 2094.7 [P.O.:100; I.V.:1994.7] Out: 400 [Urine:400]   ROS NSAIDS: Denies use IV Contrast no exposure TMP/SMX no exposure Hypotension Not present Balance of 12 systems is negative w/ exceptions as above  PMH  Past Medical History:  Diagnosis Date   Hypertension    Thyroid  disease    PSH  Past Surgical History:  Procedure Laterality Date   ABDOMINAL HYSTERECTOMY     CHOLECYSTECTOMY     TONSILLECTOMY     FH  Family History  Problem Relation Age of Onset   Brain cancer Father    Ovarian cancer Sister    Brain cancer Sister    SH  reports that she has never smoked. She has never used smokeless tobacco. She reports that she does not drink alcohol  and does not use drugs. Allergies  Allergies  Allergen Reactions   Fenofibrate Other (See Comments)    Aches   Statins Itching   Sulfa Antibiotics Other (See Comments)    headahces   Home medications Prior to Admission medications   Medication Sig Start Date End Date Taking? Authorizing Provider  acetaminophen  (TYLENOL ) 500 MG tablet Take 2 tablets (1,000 mg total) by mouth every 8 (eight) hours as needed for mild pain (pain score 1-3), headache or fever. 10/03/23   Ricky Fines, MD  carvedilol  (COREG ) 3.125 MG tablet Take 1 tablet (3.125 mg total) by mouth 2 (two) times daily. 10/03/23   Ricky Fines, MD  cloNIDine  (CATAPRES ) 0.3 MG tablet Take 0.3 mg by  mouth 3 (three) times daily.    [provider]  diltiazem  (CARDIZEM  CD) 240 MG 24 hr capsule Take 1 capsule (240 mg total) by mouth daily. 10/04/23   Ricky Fines, MD  escitalopram  (LEXAPRO ) 10 MG tablet Take 10 mg by mouth daily. Take 1/2 a tablet by mouth once a day    [provider]  furosemide  (LASIX ) 20 MG tablet Take 1 tablet (20 mg total) by mouth daily. 10/03/23   Ricky Fines, MD  gabapentin  (NEURONTIN ) 100 MG capsule Take 100 mg by mouth daily.    [provider]   hydrALAZINE  (APRESOLINE ) 50 MG tablet Take 1 tablet (50 mg total) by mouth in the morning and at bedtime. 10/03/23 01/01/24  Ricky Fines, MD  isosorbide  mononitrate (IMDUR ) 60 MG 24 hr tablet Take 1 tablet (60 mg total) by mouth daily. 10/04/23   Ricky Fines, MD  levothyroxine  (SYNTHROID , LEVOTHROID) 50 MCG tablet Take 50 mcg by mouth daily before breakfast.    [provider]  olmesartan  (BENICAR ) 40 MG tablet Take 1 tablet (40 mg total) by mouth daily. 10/03/23   Ricky Fines, MD  pantoprazole  (PROTONIX ) 40 MG tablet Take 1 tablet (40 mg total) by mouth daily. 10/04/23   Ricky Fines, MD  traMADol  (ULTRAM ) 50 MG tablet Take 1 tablet (50 mg total) by mouth every 12 (twelve) hours as needed. 08/21/23   Leath-Warren, Etta PARAS, NP    Current Medications Scheduled Meds:  carvedilol   12.5 mg Oral BID WC   cloNIDine   0.3 mg Oral TID   diltiazem   240 mg Oral Daily   escitalopram   5 mg Oral Daily   heparin   5,000 Units Subcutaneous Q8H   hydrALAZINE   100 mg Oral TID   insulin  aspart  0-5 Units Subcutaneous QHS   insulin  aspart  0-6 Units Subcutaneous TID WC   isosorbide  mononitrate  60 mg Oral Daily   levothyroxine   50 mcg Oral QAC breakfast   pantoprazole   40 mg Oral Daily   sodium chloride  flush  3 mL Intravenous Q12H   sodium chloride  flush  3 mL Intravenous Q12H   sodium chloride  flush  3-10 mL Intravenous Q12H   Continuous Infusions:  sodium chloride      sodium bicarbonate  150 mEq in dextrose  5 % 1,150 mL infusion     PRN Meds:.sodium chloride , acetaminophen , albuterol , bisacodyl , ondansetron  **OR** ondansetron  (ZOFRAN ) IV, oxyCODONE , polyethylene glycol, sodium chloride  flush, sodium chloride  flush, traZODone   CBC Recent Labs  Lab 10/11/23 0723  WBC 10.0  NEUTROABS 8.0*  HGB 9.9*  HCT 29.8*  MCV 89.5  PLT 268   Basic Metabolic Panel Recent Labs  Lab 10/11/23 0640 10/12/23 0412  NA 126* 127*  K 4.9 5.3*  CL 98 102  CO2 18* 16*  GLUCOSE 175* 158*   BUN 67* 69*  CREATININE 2.23* 2.52*  CALCIUM 8.8* 8.4*    Physical Exam  Blood pressure (!) 165/55, pulse (!) 54, temperature 98.4 F (36.9 C), resp. rate 16, height 5' 5 (1.651 m), weight 67 kg, SpO2 95%. GEN: Elderly female, unwell appearing ENT: NCAT EYES: EOMI CV: Regular, bradycardic, no rub or murmur PULM: Diminished in the bases with some bronchial breath sounds and faint crackles, normal work of breathing ABD: Soft, nontender, borborygmi present SKIN: No rashes, lesions, petechia, purpura EXT: No peripheral edema  Assessment 70F here with AoCKD3, NAGMA, hyponatremia, mild hyperkalemia after recent admission for UTI, with dyspnea and fatigue.  AoCKD3, oliguric?  Likely solitary right kidney with chronic left  renal atrophy Chronic subnephrotic proteinuria Resistant hypertension on 7 agents as an outpatient including olmesartan  NAGMA Mild hyponatremia probably from #1 and hypovolemia Mild hyperkalemia, driven by #1, #3, ARB use New anemia with hemoglobin of 9.9 from a baseline of 13, normocytic with an iron saturation of 8% and ferritin of 127 Hemoglobinuria without hematuria  Plan There has been some concern about volume status.  She does have some evidence of venous distention and an elevated BNP but a reassuring echocardiogram just this month.  Additionally clinical presentation is more consistent with hypovolemia.  Most likely explanation for her AKI is hypovolemia while using ARB in the setting of CKD with potential evolution to ATN.  Needs CK to exclude rhabdomyolysis.  Given recent antibiotics exposure AIN is possible but likely offending agent has already been withdrawn and role of kidney biopsy is limited because of solitary right functioning kidney.  Cause of dyspnea is unclear.  I wonder if she could have a viral URI outside of what has been tested for.  Change NS to D5+ 150mEq NaHCO3 at 83mL/h U Na and Cr Check CK Continue careful monitoring of her volume  status Would hold gabapentin  because of her confusion Continue to hold olmesartan  Daily weights, Daily Renal Panel, Strict I/Os, Avoid nephrotoxins (NSAIDs, judicious IV Contrast) WIll follow closely  Bernardino KATHEE Gasman  10/12/2023, 10:35 AM

## 2023-10-12 NOTE — Progress Notes (Signed)
 Mobility Specialist Progress Note:    10/12/23 1353  Mobility  Activity Transferred from bed to chair  Level of Assistance Minimal assist, patient does 75% or more  Assistive Device None  Distance Ambulated (ft) 4 ft  Range of Motion/Exercises Active;All extremities  Activity Response Tolerated well  Mobility Referral Yes  Mobility visit 1 Mobility  Mobility Specialist Start Time (ACUTE ONLY) 1315  Mobility Specialist Stop Time (ACUTE ONLY) 1335  Mobility Specialist Time Calculation (min) (ACUTE ONLY) 20 min   Pt received in bed, family in room. Agreeable to mobility, required MinA to stand and transfer to chair with no AD. Tolerated well, pt c/o SOB. SpO2 94% on 3L. Left pt in chair, alarm on. All needs met.   Sherrilee Ditty Mobility Specialist Please contact via Special Educational Needs Teacher or  Rehab office at [phone]

## 2023-10-12 NOTE — Progress Notes (Signed)
   10/12/23 0953  TOC Brief Assessment  Insurance and Status Reviewed  Patient has primary care physician Yes  Home environment has been reviewed Single Family Home  Prior level of function: Independent with some assistances from relatives  Prior/Current Home Services No current home services  Social Drivers of Health Review SDOH reviewed no interventions necessary  Readmission risk has been reviewed Yes  Transition of care needs no transition of care needs at this time     Transition of Care Department [facility]) has reviewed patient and no TOC needs have been identified at this time. We will continue to monitor patient advancement through interdisciplinary progression rounds. If new patient transition needs arise, please place a TOC consult.

## 2023-10-12 NOTE — Progress Notes (Signed)
 PROGRESS NOTE     Sherry Waller, is a 87 y.o. female, DOB - 05/04/1936, FMW:[id number]  Admit date - 10/11/2023   Admitting Physician [name], MD  Outpatient Primary MD for the patient is [name], MD  LOS - 0  Chief Complaint  Patient presents with   Shortness of Breath      Brief Narrative:   87 y.o. female with past medical history significant of hypothyroidism, depression, malignant HTN, DM2, CKD 3B, and  GERD admitted on 10/11/2023 with AKI on CKD and uncontrolled hypertension    -Assessment and Plan: 1)AKI----acute kidney injury on CKD stage -3B with an anion gap metabolic acidosis--- -?? AiN, ATN--  --in the setting of loose stools and poor oral intake --Creatinine is up to 2.23 >> 2.52 -creatinine was 1.1 on 10/01/2023 -BUN is up to 67 >>69 from 17 on 10/01/2023 -Bicarb is 18 >16 -Consult nephrologist Dr. Marlee will change IV fluid to bicarb in dextrose  renally adjust medications, avoid nephrotoxic agents / dehydration  / hypotension    2)FUO----patient with fevers and chills since 10/11/2023 -fever and chills developed somewhat productive cough with greenish-yellowish sputum -WBCs 10.0 down from 16.3 on 10/01/2023 --Chest x-ray without acute cardiopulmonary findings -UA is not suggestive of UTI---completed antibiotics for E. coli UTI recently -COVID, RSV and flu negative --No further fevers   3)-D-dimer elevated at 6.59 with acute hypoxic respiratory failure she was found to have O2 sats of 87% required 2 L of oxygen via nasal cannula -VQ scan low probably for PE -ABG without hypercapnia ,  -- hypoxia noted on ABG with PaO2 of 61,  -COVID, RSV and flu testing requested --BNP 1005 no prior---- -troponin is down to 57 was 210 on 10/01/2023, EKG sinus rhythm -- Continue supplemental oxygen and as needed bronchodilators   4)HypoNatremia---- sodium is 126 >> 127 down from a baseline around 134 -In the setting of loose stools and poor oral  intake -Hydrate IV and orally   5)Acute Anemia-----Hgb is down to 9.9 baseline usually between 13 and 14, -Patient denies any blood in the stool, no emesis -Check stool for occult blood -B12 is low at 136, replace, folate WNL -Serum iron and iron saturation are low--replace -Anemia labs/workup requested   6)Malignant HTN--- patient has history of stage II hypertension with difficult to control BP from time to time -Systolic blood pressure was around 200 mmhg in the ED suspect some component of rebound hypertension in the setting of clonidine  use -Required nicardipine  drip during the recent hospitalization -Continue Coreg , clonidine , Cardizem , isosorbide /hydralazine  combo -IV labetalol  as needed -- BP improved with IV labetalol  and resumption of oral BP meds   7)History of Depression/Neuropathy -Continue treatment with Lexapro  and Neurontin    8)Hypothyroidism----Continue Synthroid    9)DM2-A1c 6.6 reflecting good diabetic control PTA -Use Novolog /Humalog Sliding scale insulin  with Accu-Cheks/Fingersticks as ordered  10)Hyperkalemia--- due to #1 above -Give Lokelma   Status is: Inpatient   Disposition: The patient is from: Home              Anticipated d/c is to: Home              Anticipated d/c date is: 3 days              Patient currently is not medically stable to d/c. Barriers: Not Clinically Stable-   Code Status :  -  Code Status: Full Code   Family Communication:   (patient is alert, awake and coherent)  Discussed with pts son [name]  DVT Prophylaxis  :   - SCDs  heparin  injection 5,000 Units Start: 10/11/23 2200 SCDs Start: 10/11/23 1450 Place TED hose Start: 10/11/23 1450   Lab Results  Component Value Date   PLT 268 10/11/2023   Inpatient Medications  Scheduled Meds:  carvedilol   12.5 mg Oral BID WC   cloNIDine   0.3 mg Oral TID   diltiazem   240 mg Oral Daily   escitalopram   5 mg Oral Daily   heparin   5,000 Units Subcutaneous Q8H   hydrALAZINE   100 mg  Oral TID   insulin  aspart  0-5 Units Subcutaneous QHS   insulin  aspart  0-6 Units Subcutaneous TID WC   isosorbide  mononitrate  60 mg Oral Daily   levothyroxine   50 mcg Oral QAC breakfast   pantoprazole   40 mg Oral Daily   sodium chloride  flush  3 mL Intravenous Q12H   sodium chloride  flush  3 mL Intravenous Q12H   sodium chloride  flush  3-10 mL Intravenous Q12H   Continuous Infusions:  sodium bicarbonate  150 mEq in dextrose  5 % 1,150 mL infusion 75 mL/hr at 10/12/23 1039   PRN Meds:.acetaminophen , albuterol , bisacodyl , ondansetron  **OR** ondansetron  (ZOFRAN ) IV, oxyCODONE , polyethylene glycol, sodium chloride  flush, sodium chloride  flush, traZODone    Anti-infectives (From admission, onward)    None       Subjective: Laketta Soderberg today has no fevers, no emesis,  No chest pain,   -Son at bedside -Nephrologist Dr. Marlee at bedside -Not voiding much -Oral intake and appetite remains poor   Objective: Vitals:   10/11/23 2250 10/12/23 0901 10/12/23 1537 10/12/23 1726  BP: (!) 119/45 (!) 165/55 (!) 159/50 (!) 163/46  Pulse:   (!) 52 (!) 55  Resp:      Temp:   99 F (37.2 C)   TempSrc:   Oral   SpO2:   94%   Weight:      Height:        Intake/Output Summary (Last 24 hours) at 10/12/2023 1925 Last data filed at 10/12/2023 1836 Gross per 24 hour  Intake 1389.59 ml  Output 200 ml  Net 1189.59 ml   Filed Weights   10/11/23 0637  Weight: 67 kg   Physical Exam  Gen:- Awake Alert, no acute distress HEENT:- [place].AT, No sclera icterus Neck-Supple Neck,No JVD,.  Lungs-  CTAB , fair symmetrical air movement CV- S1, S2 normal, regular  Abd-  +ve B.Sounds, Abd Soft, No tenderness, no CVA area tenderness    Extremity/Skin:- No  edema, pedal pulses present  Psych-affect is appropriate, oriented x3 Neuro-no new focal deficits, no tremors  Data Reviewed: I have personally reviewed following labs and imaging studies  CBC: Recent Labs  Lab 10/11/23 0723  WBC 10.0   NEUTROABS 8.0*  HGB 9.9*  HCT 29.8*  MCV 89.5  PLT 268   Basic Metabolic Panel: Recent Labs  Lab 10/11/23 0640 10/12/23 0412  NA 126* 127*  K 4.9 5.3*  CL 98 102  CO2 18* 16*  GLUCOSE 175* 158*  BUN 67* 69*  CREATININE 2.23* 2.52*  CALCIUM 8.8* 8.4*  MG 2.5*  --    GFR: Estimated Creatinine Clearance: 14.7 mL/min (A) (by C-G formula based on SCr of 2.52 mg/dL (H)). Liver Function Tests: Recent Labs  Lab 10/11/23 0640  AST 36  ALT 25  ALKPHOS 126  BILITOT 0.8  PROT 6.5  ALBUMIN  2.9*   Cardiac Enzymes: Recent Labs  Lab 10/12/23 0412  CKTOTAL 405*   Recent Results (from the past  240 hours)  Resp panel by RT-PCR (RSV, Flu A&B, Covid)     Status: None   Collection Time: 10/11/23  4:15 PM  Result Value Ref Range Status   SARS Coronavirus 2 by RT PCR NEGATIVE NEGATIVE Final    Comment: (NOTE) SARS-CoV-2 target nucleic acids are NOT DETECTED.  The SARS-CoV-2 RNA is generally detectable in upper respiratory specimens during the acute phase of infection. The lowest concentration of SARS-CoV-2 viral copies this assay can detect is 138 copies/mL. A negative result does not preclude SARS-Cov-2 infection and should not be used as the sole basis for treatment or other patient management decisions. A negative result may occur with  improper specimen collection/handling, submission of specimen other than nasopharyngeal swab, presence of viral mutation(s) within the areas targeted by this assay, and inadequate number of viral copies(<138 copies/mL). A negative result must be combined with clinical observations, patient history, and epidemiological information. The expected result is Negative.  Fact Sheet for Patients:  bloggercourse.com  Fact Sheet for Healthcare Providers:  seriousbroker.it  This test is no t yet approved or cleared by the [place]  FDA and  has been authorized for detection and/or diagnosis of  SARS-CoV-2 by FDA under an Emergency Use Authorization (EUA). This EUA will remain  in effect (meaning this test can be used) for the duration of the COVID-19 declaration under Section 564(b)(1) of the Act, 21 U.S.C.section 360bbb-3(b)(1), unless the authorization is terminated  or revoked sooner.       Influenza A by PCR NEGATIVE NEGATIVE Final   Influenza B by PCR NEGATIVE NEGATIVE Final    Comment: (NOTE) The Xpert Xpress SARS-CoV-2/FLU/RSV plus assay is intended as an aid in the diagnosis of influenza from Nasopharyngeal swab specimens and should not be used as a sole basis for treatment. Nasal washings and aspirates are unacceptable for Xpert Xpress SARS-CoV-2/FLU/RSV testing.  Fact Sheet for Patients: bloggercourse.com  Fact Sheet for Healthcare Providers: seriousbroker.it  This test is not yet approved or cleared by the [place]  FDA and has been authorized for detection and/or diagnosis of SARS-CoV-2 by FDA under an Emergency Use Authorization (EUA). This EUA will remain in effect (meaning this test can be used) for the duration of the COVID-19 declaration under Section 564(b)(1) of the Act, 21 U.S.C. section 360bbb-3(b)(1), unless the authorization is terminated or revoked.     Resp Syncytial Virus by PCR NEGATIVE NEGATIVE Final    Comment: (NOTE) Fact Sheet for Patients: bloggercourse.com  Fact Sheet for Healthcare Providers: seriousbroker.it  This test is not yet approved or cleared by the [place]  FDA and has been authorized for detection and/or diagnosis of SARS-CoV-2 by FDA under an Emergency Use Authorization (EUA). This EUA will remain in effect (meaning this test can be used) for the duration of the COVID-19 declaration under Section 564(b)(1) of the Act, 21 U.S.C. section 360bbb-3(b)(1), unless the authorization is terminated  or revoked.  Performed at [facility], [address] [place], [place] [address]     Radiology Studies: US  RENAL Result Date: 10/12/2023 CLINICAL DATA:  Acute kidney insufficiency EXAM: RENAL / URINARY TRACT ULTRASOUND COMPLETE COMPARISON:  Ultrasound 07/19/2020 FINDINGS: Right Kidney: Renal measurements: 10.5 x 4.6 x 4.5 cm = volume: 112.7 mL. No perinephric fluid. No collecting system dilatation. Left Kidney: Renal measurements: 8.7 x 4.1 x 4.6 cm = volume: 86.9 mL. Slight perinephric fluid. Small anechoic structure associated with kidney measures 10 mm. Simple cyst. The left kidney is echogenic. Bladder: Appears normal for degree of  bladder distention. Other: Left kidney not as well seen with overlapping bowel gas and soft tissue. IMPRESSION: No collecting system dilatation.  Small echogenic left kidney. Electronically Signed   By: Ranell Bring M.D.   On: 10/12/2023 13:34   NM Pulmonary Perfusion Result Date: 10/11/2023 CLINICAL DATA:  High prob PE suspected, shortness of breath times weeks EXAM: NUCLEAR MEDICINE PERFUSION LUNG SCAN TECHNIQUE: Perfusion images were obtained in multiple projections after intravenous injection of radiopharmaceutical. Ventilation scans intentionally deferred if perfusion scan and chest x-ray adequate for interpretation during COVID 19 epidemic. RADIOPHARMACEUTICALS:  4.4 mCi Tc-4m MAA IV COMPARISON:  Portable chest radiograph from the same day FINDINGS: Heterogenous bilateral distribution of radiopharmaceutical. No discrete segmental or subsegmental perfusion defects. IMPRESSION: Low likelihood ratio for pulmonary embolism. Electronically Signed   By: JONETTA Faes M.D.   On: 10/11/2023 13:37   DG Chest Portable 1 View Result Date: 10/11/2023 CLINICAL DATA:  Shortness of breath. EXAM: PORTABLE CHEST 1 VIEW COMPARISON:  10/01/2023 FINDINGS: The lungs are clear without focal pneumonia, edema, pneumothorax or pleural effusion. Interstitial markings are diffusely  coarsened with chronic features. The cardio pericardial silhouette is enlarged. No acute bony abnormality. Telemetry leads overlie the chest. IMPRESSION: Chronic interstitial coarsening without acute cardiopulmonary findings. Electronically Signed   By: Camellia Candle M.D.   On: 10/11/2023 07:27   Scheduled Meds:  carvedilol   12.5 mg Oral BID WC   cloNIDine   0.3 mg Oral TID   diltiazem   240 mg Oral Daily   escitalopram   5 mg Oral Daily   heparin   5,000 Units Subcutaneous Q8H   hydrALAZINE   100 mg Oral TID   insulin  aspart  0-5 Units Subcutaneous QHS   insulin  aspart  0-6 Units Subcutaneous TID WC   isosorbide  mononitrate  60 mg Oral Daily   levothyroxine   50 mcg Oral QAC breakfast   pantoprazole   40 mg Oral Daily   sodium chloride  flush  3 mL Intravenous Q12H   sodium chloride  flush  3 mL Intravenous Q12H   sodium chloride  flush  3-10 mL Intravenous Q12H   Continuous Infusions:  sodium bicarbonate  150 mEq in dextrose  5 % 1,150 mL infusion 75 mL/hr at 10/12/23 1039    LOS: 0 days   Rendall Carwin M.D on 10/12/2023 at 7:25 PM  Go to [email or web address] - for contact info  [facility] - Office  [phone]  If 7PM-7AM, please contact night-coverage [email or web address] 10/12/2023, 7:25 PM

## 2023-10-13 DIAGNOSIS — J9601 Acute respiratory failure with hypoxia: Secondary | ICD-10-CM | POA: Diagnosis not present

## 2023-10-13 LAB — RENAL FUNCTION PANEL
Albumin: 2.5 g/dL — ABNORMAL LOW (ref 3.5–5.0)
Anion gap: 11 (ref 5–15)
BUN: 64 mg/dL — ABNORMAL HIGH (ref 8–23)
CO2: 20 mmol/L — ABNORMAL LOW (ref 22–32)
Calcium: 8.6 mg/dL — ABNORMAL LOW (ref 8.9–10.3)
Chloride: 94 mmol/L — ABNORMAL LOW (ref 98–111)
Creatinine, Ser: 2.4 mg/dL — ABNORMAL HIGH (ref 0.44–1.00)
GFR, Estimated: 19 mL/min — ABNORMAL LOW (ref >60)
Glucose, Bld: 143 mg/dL — ABNORMAL HIGH (ref 70–99)
Phosphorus: 3.5 mg/dL (ref 2.5–4.6)
Potassium: 4.9 mmol/L (ref 3.5–5.1)
Sodium: 125 mmol/L — ABNORMAL LOW (ref 135–145)

## 2023-10-13 LAB — CBC
HCT: 27 % — ABNORMAL LOW (ref 36.0–46.0)
Hemoglobin: 8.6 g/dL — ABNORMAL LOW (ref 12.0–15.0)
MCH: 28.8 pg (ref 26.0–34.0)
MCHC: 31.9 g/dL (ref 30.0–36.0)
MCV: 90.3 fL (ref 80.0–100.0)
Platelets: 266 10*3/uL (ref 150–400)
RBC: 2.99 MIL/uL — ABNORMAL LOW (ref 3.87–5.11)
RDW: 13.6 % (ref 11.5–15.5)
WBC: 11.2 10*3/uL — ABNORMAL HIGH (ref 4.0–10.5)
nRBC: 0 % (ref 0.0–0.2)

## 2023-10-13 LAB — SODIUM, URINE, RANDOM: Sodium, Ur: 17 mmol/L

## 2023-10-13 LAB — OCCULT BLOOD X 1 CARD TO LAB, STOOL: Fecal Occult Bld: NEGATIVE

## 2023-10-13 LAB — GLUCOSE, CAPILLARY
Glucose-Capillary: 187 mg/dL — ABNORMAL HIGH (ref 70–99)
Glucose-Capillary: 224 mg/dL — ABNORMAL HIGH (ref 70–99)
Glucose-Capillary: 179 mg/dL — ABNORMAL HIGH (ref 70–99)
Glucose-Capillary: 156 mg/dL — ABNORMAL HIGH (ref 70–99)
Glucose-Capillary: 137 mg/dL — ABNORMAL HIGH (ref 70–99)

## 2023-10-13 LAB — CREATININE, URINE, RANDOM: Creatinine, Urine: 165 mg/dL

## 2023-10-13 MED ORDER — DILTIAZEM HCL ER COATED BEADS 120 MG PO CP24
120.0000 mg | ORAL_CAPSULE | Freq: Every day | ORAL | Status: DC
  Administered 2023-10-14 – 2023-10-18 (×5): 120 mg via ORAL
  Filled 2023-10-13 (×5): qty 1

## 2023-10-13 MED ORDER — SODIUM BICARBONATE 8.4 % IV SOLN
INTRAVENOUS | Status: DC
  Filled 2023-10-13 (×2): qty 1000

## 2023-10-13 NOTE — TOC Initial Note (Signed)
 Transition of Care [facility]) - Initial/Assessment Note    Patient Details  Name: Sherry Waller MRN: [id number] Date of Birth: 11/08/1935  Transition of Care [facility]) CM/SW Contact:    Noreen KATHEE Pinal, LCSWA Phone Number: 10/13/2023, 11:05 AM  Clinical Narrative:                 CSW spoke with son who was at bedside with pt and assessed. Patient was admitted for acute respiratory failure with hypoxia. Patient has had HH before in the home and is open to it , if it is recommended. Pt is independent with dressing and bathing, pt snacks throughout the day, and her children brings food. Pt has a walker and a recliner chair at home that she utillizes. Son will pick up once patient is medically ready. TOC will continue to follow.   Expected Discharge Plan: Home/Self Care Barriers to Discharge: Continued Medical Work up   Patient Goals and CMS Choice Patient states their goals for this hospitalization and ongoing recovery are:: return back home CMS Medicare.gov Compare Post Acute Care list provided to:: Patient Represenative (must comment) Marny- son) Choice offered to / list presented to : Adult Children      Expected Discharge Plan and Services In-house Referral: Clinical Social Work   Post Acute Care Choice: Durable Medical Equipment Living arrangements for the past 2 months: Single Family Home                    Prior Living Arrangements/Services Living arrangements for the past 2 months: Single Family Home Lives with:: Self Patient language and need for interpreter reviewed:: Yes Do you feel safe going back to the place where you live?: Yes      Need for Family Participation in Patient Care: Yes (Comment) Care giver support system in place?: Yes (comment) Current home services: DME Criminal Activity/Legal Involvement Pertinent to Current Situation/Hospitalization: No - Comment as needed  Activities of Daily Living   ADL Screening (condition at time of admission) Independently  performs ADLs?: Yes (appropriate for developmental age) Is the patient deaf or have difficulty hearing?: No Does the patient have difficulty seeing, even when wearing glasses/contacts?: No Does the patient have difficulty concentrating, remembering, or making decisions?: No  Permission Sought/Granted Permission sought to share information with : Family Supports    Share Information with NAME: [name]     Permission granted to share info w Relationship: son     Emotional Assessment Appearance:: Appears stated age   Affect (typically observed): Appropriate Orientation: : Oriented to Self, Oriented to Place, Oriented to  Time, Oriented to Situation Alcohol  / Substance Use: Not Applicable Psych Involvement: No (comment)  Admission diagnosis:  Acute respiratory failure with hypoxia (HCC) [J96.01] Acute kidney injury (HCC) [N17.9] AKI (acute kidney injury) (HCC) [N17.9] Patient Active Problem List   Diagnosis Date Noted   AKI (acute kidney injury) (HCC) 10/12/2023   Acute respiratory failure with hypoxia (HCC) 10/11/2023   HTN (hypertension), malignant 10/11/2023   Hypothyroidism 10/11/2023   CKD (chronic kidney disease) stage 3B- 10/11/2023   Hypertensive urgency 10/01/2023   DOE p covid 19 10/09/2022   PCP:  [name], MD Pharmacy:   [facility] [address], [place] - [address] [place] [address] [place] [address] Phone: [phone] Fax: [phone]     Social Drivers of Health (SDOH) Social History: SDOH Screenings   Food Insecurity: No Food Insecurity (10/11/2023)  Housing: Low Risk  (10/11/2023)  Transportation Needs:  No Transportation Needs (10/11/2023)  Utilities: Not At Risk (10/11/2023)  Social Connections: Socially Isolated (10/11/2023)  Tobacco Use: Low Risk  (10/11/2023)   SDOH Interventions:     Readmission Risk Interventions    10/13/2023   11:03 AM  Readmission Risk Prevention Plan  Transportation Screening Complete  HRI or  Home Care Consult Complete  Social Work Consult for Recovery Care Planning/Counseling Complete  Palliative Care Screening Not Applicable  Medication Review Oceanographer) Complete

## 2023-10-13 NOTE — Progress Notes (Signed)
 Admit: 10/11/2023 LOS: 1  76F here with AoCKD3, NAGMA, hyponatremia, mild hyperkalemia after recent admission for UTI, with dyspnea and fatigue.   Subjective:  Stable in the past 24 hours Creatinine slightly improved to 2.4, sodium 125, K4.9 0.3 L UOP documented with pure wick Urine sodium 17 with FENa 0.2% CK normal Remains on IV fluids Poor appetite, not drinking very much Out of bed to chair this morning, son at bedside Blood pressure labile but overall stable  12/31 0701 - 01/01 0700 In: 950 [P.O.:940; I.V.:10] Out: 320 [Urine:320]  Filed Weights   10/11/23 0637 10/13/23 0500  Weight: 67 kg 82.3 kg    Scheduled Meds:  carvedilol   12.5 mg Oral BID WC   cloNIDine   0.3 mg Oral TID   diltiazem   240 mg Oral Daily   escitalopram   5 mg Oral Daily   ferrous sulfate   325 mg Oral Q breakfast   heparin   5,000 Units Subcutaneous Q8H   hydrALAZINE   100 mg Oral TID   insulin  aspart  0-5 Units Subcutaneous QHS   insulin  aspart  0-6 Units Subcutaneous TID WC   isosorbide  mononitrate  60 mg Oral Daily   levothyroxine   50 mcg Oral QAC breakfast   pantoprazole   40 mg Oral Daily   sodium chloride  flush  3 mL Intravenous Q12H   sodium chloride  flush  3 mL Intravenous Q12H   sodium chloride  flush  3-10 mL Intravenous Q12H   Continuous Infusions: PRN Meds:.acetaminophen , albuterol , bisacodyl , ondansetron  **OR** ondansetron  (ZOFRAN ) IV, oxyCODONE , polyethylene glycol, sodium chloride  flush, sodium chloride  flush, traZODone   Current Labs: reviewed   Physical Exam:  Blood pressure (!) 169/58, pulse (!) 54, temperature 98.4 F (36.9 C), temperature source Oral, resp. rate 16, height 5' 5 (1.651 m), weight 82.3 kg, SpO2 95%. GEN: Elderly female, unwell appearing, in chair ENT: NCAT EYES: EOMI CV: Regular, bradycardic, no rub or murmur PULM: Diminished in the bases with some bronchial breath sounds and faint crackles, normal work of breathing ABD: Soft, nontender, borborygmi  present SKIN: No rashes, lesions, petechia, purpura EXT: No peripheral edema  A AoCKD3, oliguric?  Suspect hypovolemia with potential progression to ATN.  FENa low consistent with an effective arterial blood volume.  10/12/2023 kidney US  without acute structural or obstructive findings, confirmed #2 below Likely solitary right kidney with chronic left renal atrophy Chronic subnephrotic proteinuria on ARB as outpatient Resistant hypertension on 7 agents as an outpatient including olmesartan  NAGMA, improved Mild hyponatremia probably from #1 and hypovolemia, stable Mild hyperkalemia, driven by #1, #3, ARB use, resolved New anemia with hemoglobin of 9.9 from a baseline of 13, normocytic with an iron saturation of 8% and ferritin of 127, low B12 level, per TRH Hemoglobinuria without hematuria, CK was normal  P Continue supportive care including IV fluids until oral intake improves, carefully administered and monitored No indication for dialysis Medication Issues; Preferred narcotic agents for pain control are hydromorphone , fentanyl, and methadone. Morphine should not be used.  Baclofen should be avoided Avoid oral sodium phosphate  and magnesium citrate based laxatives / bowel preps    Bernardino Gasman MD 10/13/2023, 11:29 AM  Recent Labs  Lab 10/11/23 0640 10/12/23 0412 10/13/23 0410  NA 126* 127* 125*  K 4.9 5.3* 4.9  CL 98 102 94*  CO2 18* 16* 20*  GLUCOSE 175* 158* 143*  BUN 67* 69* 64*  CREATININE 2.23* 2.52* 2.40*  CALCIUM 8.8* 8.4* 8.6*  PHOS  --   --  3.5   Recent Labs  Lab 10/11/23 0723 10/13/23 0410  WBC 10.0 11.2*  NEUTROABS 8.0*  --   HGB 9.9* 8.6*  HCT 29.8* 27.0*  MCV 89.5 90.3  PLT 268 266

## 2023-10-13 NOTE — Plan of Care (Signed)
°  Problem: Health Behavior/Discharge Planning: °Goal: Ability to manage health-related needs will improve °Outcome: Progressing °  °Problem: Clinical Measurements: °Goal: Ability to maintain clinical measurements within normal limits will improve °Outcome: Progressing °  °Problem: Clinical Measurements: °Goal: Diagnostic test results will improve °Outcome: Progressing °  °Problem: Clinical Measurements: °Goal: Respiratory complications will improve °Outcome: Progressing °  °

## 2023-10-13 NOTE — Progress Notes (Signed)
 Mobility Specialist Progress Note:    10/13/23 0940  Mobility  Activity Transferred from bed to chair  Level of Assistance Moderate assist, patient does 50-74%  Assistive Device None  Distance Ambulated (ft) 5 ft  Range of Motion/Exercises Active;All extremities  Activity Response Tolerated well  Mobility Referral Yes  Mobility visit 1 Mobility  Mobility Specialist Start Time (ACUTE ONLY) [id number]  Mobility Specialist Stop Time (ACUTE ONLY) 0940  Mobility Specialist Time Calculation (min) (ACUTE ONLY) 15 min   Pt received in bed, family in room. Agreeable to mobility, required ModA to stand and transfer with no AD. Tolerated well, c/o SOB. SpO2 95% on 3L.Left pt in chair, alarm on. All needs met.   Sherrilee Ditty Mobility Specialist Please contact via Special Educational Needs Teacher or  Rehab office at [phone]

## 2023-10-13 NOTE — Progress Notes (Signed)
 PROGRESS NOTE     Sherry Waller, is a 88 y.o. female, DOB - 1935-11-07, FMW:[id number]  Admit date - 10/11/2023   Admitting Physician Severn Goddard Pearlean, MD  Outpatient Primary MD for the patient is [name], MD  LOS - 1  Chief Complaint  Patient presents with   Shortness of Breath      Brief Narrative:   88 y.o. female with past medical history significant of hypothyroidism, depression, malignant HTN, DM2, CKD 3B, and  GERD admitted on 10/11/2023 with AKI on CKD and uncontrolled hypertension    -Assessment and Plan: 1)AKI----acute kidney injury on CKD stage -3B with an anion gap metabolic acidosis--- -?? AiN, ATN--  --in the setting of loose stools and poor oral intake --Creatinine is up to 2.23 >> 2.52> 2.40 -creatinine was 1.1 on 10/01/2023 -BUN is up to 67 >>69 from 17 on 10/01/2023 -Bicarb is 18 >16>20 -Per Nephrologist Dr. Marlee c/n iV fluid to bicarb in dextrose  until oral intake is more reliable renally adjust medications, avoid nephrotoxic agents / dehydration  / hypotension    2)FUO----patient with fevers and chills since 10/11/2023 -fever and chills developed somewhat productive cough with greenish-yellowish sputum -WBCs 11.2 down from 16.3 on 10/01/2023 --Chest x-ray without acute cardiopulmonary findings -UA is not suggestive of UTI---completed antibiotics for E. coli UTI recently -COVID, RSV and flu negative --No further fevers   3)-D-dimer elevated at 6.59 with acute hypoxic respiratory failure she was found to have O2 sats of 87% required 2 L of oxygen via nasal cannula -VQ scan low probably for PE -ABG without hypercapnia ,  -- hypoxia noted on ABG with PaO2 of 61,  -COVID, RSV and flu testing requested --BNP 1005 no prior---- -troponin is down to 57 was 210 on 10/01/2023, EKG sinus rhythm -- Continue supplemental oxygen and as needed bronchodilators   4)HypoNatremia---- sodium is 125 down from a baseline around 134 -In the setting of loose  stools and poor oral intake prior to admission -Hydrate IV and orally   5)Acute Anemia-----Hgb is down to 8.6 baseline usually between 13 and 14, -Patient denies any blood in the stool, no emesis - stool for occult blood is neg -B12 is low at 136, replace, - folate WNL -Serum iron and iron saturation are low--replace -Anemia labs/workup as above   6)Malignant HTN--- patient has history of stage II hypertension with difficult to control BP from time to time -Systolic blood pressure was around 200 mmhg in the ED suspect some component of rebound hypertension in the setting of clonidine  use -Required nicardipine  drip during the recent hospitalization -Continue Coreg , clonidine ,  isosorbide /hydralazine  combo -Reduce Cardizem  CD to 120 mg daily from 240 mg due to bradycardia -IV hydralazine  as needed elevated BP -- BP improved with IV labetalol  and resumption of oral BP meds   7)History of Depression/Neuropathy -Continue treatment with Lexapro  and Neurontin    8)Hypothyroidism----Continue Synthroid    9)DM2-A1c 6.6 reflecting good diabetic control PTA -Use Novolog /Humalog Sliding scale insulin  with Accu-Cheks/Fingersticks as ordered  10)Hyperkalemia--- due to #1 above -Resolved with Lokelma   Status is: Inpatient   Disposition: The patient is from: Home              Anticipated d/c is to: Home              Anticipated d/c date is: 3 days              Patient currently is not medically stable to d/c. Barriers: Not Clinically Stable-   Code Status :  -  Code Status: Full Code   Family Communication:   (patient is alert, awake and coherent)  Discussed with pts son Sherry Waller  DVT Prophylaxis  :   - SCDs  heparin  injection 5,000 Units Start: 10/11/23 2200 SCDs Start: 10/11/23 1450 Place TED hose Start: 10/11/23 1450   Lab Results  Component Value Date   PLT 266 10/13/2023   Inpatient Medications  Scheduled Meds:  carvedilol   12.5 mg Oral BID WC   cloNIDine   0.3 mg Oral TID    diltiazem   240 mg Oral Daily   escitalopram   5 mg Oral Daily   ferrous sulfate   325 mg Oral Q breakfast   heparin   5,000 Units Subcutaneous Q8H   hydrALAZINE   100 mg Oral TID   insulin  aspart  0-5 Units Subcutaneous QHS   insulin  aspart  0-6 Units Subcutaneous TID WC   isosorbide  mononitrate  60 mg Oral Daily   levothyroxine   50 mcg Oral QAC breakfast   pantoprazole   40 mg Oral Daily   sodium chloride  flush  3 mL Intravenous Q12H   sodium chloride  flush  3 mL Intravenous Q12H   sodium chloride  flush  3-10 mL Intravenous Q12H   Continuous Infusions:  sodium bicarbonate  150 mEq in dextrose  5 % 1,150 mL infusion 75 mL/hr at 10/13/23 1217   PRN Meds:.acetaminophen , albuterol , bisacodyl , ondansetron  **OR** ondansetron  (ZOFRAN ) IV, oxyCODONE , polyethylene glycol, sodium chloride  flush, sodium chloride  flush, traZODone    Anti-infectives (From admission, onward)    None       Subjective: Sherry Waller today has no further fevers, no further emesis,  No chest pain,   -- Sitting up in the chair =-Son at bedside, patient is more awake -Appetite remains poor  Objective: Vitals:   10/13/23 0500 10/13/23 0827 10/13/23 1414 10/13/23 1649  BP: (!) 147/52 (!) 169/58 (!) 148/50 (!) 142/46  Pulse: (!) 54  (!) 48   Resp: 16     Temp: 98.4 F (36.9 C)  98.2 F (36.8 C)   TempSrc: Oral  Axillary   SpO2: 95%  93%   Weight: 82.3 kg     Height:        Intake/Output Summary (Last 24 hours) at 10/13/2023 1835 Last data filed at 10/13/2023 0946 Gross per 24 hour  Intake 950 ml  Output 320 ml  Net 630 ml   Filed Weights   10/11/23 0637 10/13/23 0500  Weight: 67 kg 82.3 kg   Physical Exam  Gen:- Awake Alert, no acute distress HEENT:- [place].AT, No sclera icterus Neck-Supple Neck,No JVD,.  Lungs-  CTAB , fair symmetrical air movement CV- S1, S2 normal, regular  Abd-  +ve B.Sounds, Abd Soft, No tenderness, no CVA area tenderness    Extremity/Skin:- No  edema, pedal pulses present   Psych-affect is appropriate, oriented x3 Neuro-generalized weakness, no new focal deficits, no tremors  Data Reviewed: I have personally reviewed following labs and imaging studies  CBC: Recent Labs  Lab 10/11/23 0723 10/13/23 0410  WBC 10.0 11.2*  NEUTROABS 8.0*  --   HGB 9.9* 8.6*  HCT 29.8* 27.0*  MCV 89.5 90.3  PLT 268 266   Basic Metabolic Panel: Recent Labs  Lab 10/11/23 0640 10/12/23 0412 10/13/23 0410  NA 126* 127* 125*  K 4.9 5.3* 4.9  CL 98 102 94*  CO2 18* 16* 20*  GLUCOSE 175* 158* 143*  BUN 67* 69* 64*  CREATININE 2.23* 2.52* 2.40*  CALCIUM 8.8* 8.4* 8.6*  MG 2.5*  --   --  PHOS  --   --  3.5   GFR: Estimated Creatinine Clearance: 18.2 mL/min (A) (by C-G formula based on SCr of 2.4 mg/dL (H)). Liver Function Tests: Recent Labs  Lab 10/11/23 0640 10/13/23 0410  AST 36  --   ALT 25  --   ALKPHOS 126  --   BILITOT 0.8  --   PROT 6.5  --   ALBUMIN  2.9* 2.5*   Cardiac Enzymes: Recent Labs  Lab 10/12/23 0412  CKTOTAL 405*   Recent Results (from the past 240 hours)  Resp panel by RT-PCR (RSV, Flu A&B, Covid)     Status: None   Collection Time: 10/11/23  4:15 PM  Result Value Ref Range Status   SARS Coronavirus 2 by RT PCR NEGATIVE NEGATIVE Final    Comment: (NOTE) SARS-CoV-2 target nucleic acids are NOT DETECTED.  The SARS-CoV-2 RNA is generally detectable in upper respiratory specimens during the acute phase of infection. The lowest concentration of SARS-CoV-2 viral copies this assay can detect is 138 copies/mL. A negative result does not preclude SARS-Cov-2 infection and should not be used as the sole basis for treatment or other patient management decisions. A negative result may occur with  improper specimen collection/handling, submission of specimen other than nasopharyngeal swab, presence of viral mutation(s) within the areas targeted by this assay, and inadequate number of viral copies(<138 copies/mL). A negative result must be  combined with clinical observations, patient history, and epidemiological information. The expected result is Negative.  Fact Sheet for Patients:  bloggercourse.com  Fact Sheet for Healthcare Providers:  seriousbroker.it  This test is no t yet approved or cleared by the [place]  FDA and  has been authorized for detection and/or diagnosis of SARS-CoV-2 by FDA under an Emergency Use Authorization (EUA). This EUA will remain  in effect (meaning this test can be used) for the duration of the COVID-19 declaration under Section 564(b)(1) of the Act, 21 U.S.C.section 360bbb-3(b)(1), unless the authorization is terminated  or revoked sooner.       Influenza A by PCR NEGATIVE NEGATIVE Final   Influenza B by PCR NEGATIVE NEGATIVE Final    Comment: (NOTE) The Xpert Xpress SARS-CoV-2/FLU/RSV plus assay is intended as an aid in the diagnosis of influenza from Nasopharyngeal swab specimens and should not be used as a sole basis for treatment. Nasal washings and aspirates are unacceptable for Xpert Xpress SARS-CoV-2/FLU/RSV testing.  Fact Sheet for Patients: bloggercourse.com  Fact Sheet for Healthcare Providers: seriousbroker.it  This test is not yet approved or cleared by the [place]  FDA and has been authorized for detection and/or diagnosis of SARS-CoV-2 by FDA under an Emergency Use Authorization (EUA). This EUA will remain in effect (meaning this test can be used) for the duration of the COVID-19 declaration under Section 564(b)(1) of the Act, 21 U.S.C. section 360bbb-3(b)(1), unless the authorization is terminated or revoked.     Resp Syncytial Virus by PCR NEGATIVE NEGATIVE Final    Comment: (NOTE) Fact Sheet for Patients: bloggercourse.com  Fact Sheet for Healthcare Providers: seriousbroker.it  This test is not yet  approved or cleared by the [place]  FDA and has been authorized for detection and/or diagnosis of SARS-CoV-2 by FDA under an Emergency Use Authorization (EUA). This EUA will remain in effect (meaning this test can be used) for the duration of the COVID-19 declaration under Section 564(b)(1) of the Act, 21 U.S.C. section 360bbb-3(b)(1), unless the authorization is terminated or revoked.  Performed at [facility], [address],  [place], [place] [address]     Radiology Studies: US  RENAL Result Date: 10/12/2023 CLINICAL DATA:  Acute kidney insufficiency EXAM: RENAL / URINARY TRACT ULTRASOUND COMPLETE COMPARISON:  Ultrasound 07/19/2020 FINDINGS: Right Kidney: Renal measurements: 10.5 x 4.6 x 4.5 cm = volume: 112.7 mL. No perinephric fluid. No collecting system dilatation. Left Kidney: Renal measurements: 8.7 x 4.1 x 4.6 cm = volume: 86.9 mL. Slight perinephric fluid. Small anechoic structure associated with kidney measures 10 mm. Simple cyst. The left kidney is echogenic. Bladder: Appears normal for degree of bladder distention. Other: Left kidney not as well seen with overlapping bowel gas and soft tissue. IMPRESSION: No collecting system dilatation.  Small echogenic left kidney. Electronically Signed   By: Ranell Bring M.D.   On: 10/12/2023 13:34   Scheduled Meds:  carvedilol   12.5 mg Oral BID WC   cloNIDine   0.3 mg Oral TID   diltiazem   240 mg Oral Daily   escitalopram   5 mg Oral Daily   ferrous sulfate   325 mg Oral Q breakfast   heparin   5,000 Units Subcutaneous Q8H   hydrALAZINE   100 mg Oral TID   insulin  aspart  0-5 Units Subcutaneous QHS   insulin  aspart  0-6 Units Subcutaneous TID WC   isosorbide  mononitrate  60 mg Oral Daily   levothyroxine   50 mcg Oral QAC breakfast   pantoprazole   40 mg Oral Daily   sodium chloride  flush  3 mL Intravenous Q12H   sodium chloride  flush  3 mL Intravenous Q12H   sodium chloride  flush  3-10 mL Intravenous Q12H   Continuous Infusions:  sodium  bicarbonate 150 mEq in dextrose  5 % 1,150 mL infusion 75 mL/hr at 10/13/23 1217    LOS: 1 day   Rendall Carwin M.D on 10/13/2023 at 6:35 PM  Go to [email or web address] - for contact info  [facility] - Office  [phone]  If 7PM-7AM, please contact night-coverage [email or web address] 10/13/2023, 6:35 PM

## 2023-10-13 NOTE — Progress Notes (Addendum)
 Pt didn't produce BM overnight. BP meds were held at 124/32 and retaken now 147/52. Urine sent to lab

## 2023-10-14 DIAGNOSIS — J9601 Acute respiratory failure with hypoxia: Secondary | ICD-10-CM | POA: Diagnosis not present

## 2023-10-14 LAB — GLUCOSE, CAPILLARY
Glucose-Capillary: 158 mg/dL — ABNORMAL HIGH (ref 70–99)
Glucose-Capillary: 148 mg/dL — ABNORMAL HIGH (ref 70–99)
Glucose-Capillary: 156 mg/dL — ABNORMAL HIGH (ref 70–99)
Glucose-Capillary: 167 mg/dL — ABNORMAL HIGH (ref 70–99)

## 2023-10-14 LAB — BASIC METABOLIC PANEL
Anion gap: 11 (ref 5–15)
BUN: 63 mg/dL — ABNORMAL HIGH (ref 8–23)
CO2: 24 mmol/L (ref 22–32)
Calcium: 8.4 mg/dL — ABNORMAL LOW (ref 8.9–10.3)
Chloride: 88 mmol/L — ABNORMAL LOW (ref 98–111)
Creatinine, Ser: 2.28 mg/dL — ABNORMAL HIGH (ref 0.44–1.00)
GFR, Estimated: 21 mL/min — ABNORMAL LOW (ref >60)
Glucose, Bld: 162 mg/dL — ABNORMAL HIGH (ref 70–99)
Potassium: 4.3 mmol/L (ref 3.5–5.1)
Sodium: 123 mmol/L — ABNORMAL LOW (ref 135–145)

## 2023-10-14 MED ORDER — SODIUM CHLORIDE 0.9 % IV SOLN: INTRAVENOUS | Status: DC

## 2023-10-14 MED ORDER — FUROSEMIDE 10 MG/ML IJ SOLN
120.0000 mg | Freq: Four times a day (QID) | INTRAVENOUS | Status: AC
  Administered 2023-10-14 (×2): 120 mg via INTRAVENOUS
  Filled 2023-10-14 (×2): qty 12

## 2023-10-14 MED ORDER — HYDRALAZINE HCL 20 MG/ML IJ SOLN: 10.0000 mg | Freq: Four times a day (QID) | INTRAMUSCULAR | Status: DC | PRN

## 2023-10-14 NOTE — Progress Notes (Signed)
 MEWS Progress Note  Patient Details Name: Sherry Waller MRN: [id number] DOB: [birth year] Today's Date: 10/14/2023   MEWS Flowsheet Documentation:  Assess: MEWS Score Temp: 98.6 F (37 C) BP: (!) 178/48 MAP (mmHg): 84 Pulse Rate: (!) 58 ECG Heart Rate: (!) 52 Resp: 16 Level of Consciousness: Alert SpO2: 95 % O2 Device: Nasal Cannula O2 Flow Rate (L/min): 92 L/min FiO2 (%): (!) 0 % Assess: MEWS Score MEWS Temp: 0 MEWS Systolic: 0 MEWS Pulse: 0 MEWS RR: 0 MEWS LOC: 0 MEWS Score: 0 MEWS Score Color: Green Assess: SIRS CRITERIA SIRS Temperature : 0 SIRS Respirations : 0 SIRS Pulse: 0 SIRS WBC: 0 SIRS Score Sum : 0 SIRS Temperature : 0 SIRS Pulse: 0 SIRS Respirations : 0 SIRS WBC: 0 SIRS Score Sum : 0 Assess: if the MEWS score is Yellow or Red Were vital signs accurate and taken at a resting state?: Yes Does the patient meet 2 or more of the SIRS criteria?: Yes Does the patient have a confirmed or suspected source of infection?: No MEWS guidelines implemented : No, previously yellow, continue vital signs every 4 hours Treat MEWS Interventions: Considered administering scheduled or prn medications/treatments as ordered Take Vital Signs Increase Vital Sign Frequency : Yellow: Q2hr x1, continue Q4hrs until patient remains green for 12hrs Escalate MEWS: Escalate: Yellow: Discuss with charge nurse and consider notifying provider and/or RRT        Andrea Mayer 10/14/2023, 6:34 AM

## 2023-10-14 NOTE — Progress Notes (Signed)
 Pt is progressing. Pt stated she is slowly starting to feel better. She is hoping the IV fluids will help with her dehydration so she can feel better to return home. Will continue to monitor the pt and retake v/s

## 2023-10-14 NOTE — Plan of Care (Signed)
  Problem: Health Behavior/Discharge Planning: Goal: Ability to manage health-related needs will improve Outcome: Not Met (add Reason)   Problem: Clinical Measurements: Goal: Ability to maintain clinical measurements within normal limits will improve Outcome: Not Met (add Reason)

## 2023-10-14 NOTE — Progress Notes (Signed)
 Admit: 10/11/2023 LOS: 2  38F here with AoCKD3, NAGMA, hyponatremia, mild hyperkalemia after recent admission for UTI, with dyspnea and fatigue.   Subjective:  Patient continues to have minimal urine output.  Amber in color.  Family has noticed worsening edema.  Patient also more short of breath.  Patient is fairly confused.  Appetite has been poor but this is chronic.  Creatinine about the same.  Sodium lower at 123.  01/01 0701 - 01/02 0700 In: 480 [P.O.:480] Out: 300 [Urine:300]  Filed Weights   10/11/23 0637 10/13/23 0500 10/14/23 0320  Weight: 67 kg 82.3 kg 77.3 kg    Scheduled Meds:  carvedilol   12.5 mg Oral BID WC   cloNIDine   0.3 mg Oral TID   diltiazem   120 mg Oral Daily   escitalopram   5 mg Oral Daily   ferrous sulfate   325 mg Oral Q breakfast   heparin   5,000 Units Subcutaneous Q8H   hydrALAZINE   100 mg Oral TID   insulin  aspart  0-5 Units Subcutaneous QHS   insulin  aspart  0-6 Units Subcutaneous TID WC   isosorbide  mononitrate  60 mg Oral Daily   levothyroxine   50 mcg Oral QAC breakfast   pantoprazole   40 mg Oral Daily   sodium chloride  flush  3 mL Intravenous Q12H   sodium chloride  flush  3 mL Intravenous Q12H   sodium chloride  flush  3-10 mL Intravenous Q12H   Continuous Infusions:  furosemide      PRN Meds:.acetaminophen , albuterol , bisacodyl , ondansetron  **OR** ondansetron  (ZOFRAN ) IV, oxyCODONE , polyethylene glycol, sodium chloride  flush, sodium chloride  flush, traZODone   Current Labs: reviewed   Physical Exam:  Blood pressure (!) 172/51, pulse (!) 58, temperature 98.6 F (37 C), temperature source Tympanic, resp. rate 16, height 5' 5 (1.651 m), weight 77.3 kg, SpO2 95%. GEN: lying in bed, ill-appearing, no distress ENT: NCAT, no nasal discharge CV: normal rate, no rub PULM: no crackles anteriorly, mild increased work of breathing ABD: Soft, nontender EXT: edema present in all 4 extremities  A AoCKD3, oliguric  suspicion for hypovolemia.  Fairly low  Fena.  Felt to possibly have some degree of tubular injury. Now with volume overload.  Could have some component of cardiorenal syndrome but most likely volume excess in the setting of tubular injury Likely solitary right kidney with chronic left renal atrophy Chronic subnephrotic proteinuria on ARB as outpatient Resistant hypertension on 7 agents as an outpatient including olmesartan  NAGMA, improved Moderate hyponatremia: Likely secondary to AKI and possible heart failure Mild hyperkalemia, driven by #1, #3, ARB use, resolved New anemia : Iron deficiency and some B12 deficiency.  Per to primary Hemoglobinuria without hematuria, CK was normal.  Repeat urinalysis without hematuria  P Stop IV fluids and startIV Lasix  120 mg twice daily for today IV Lasix  likely will help hyponatremia Monitor altered mental status closely: Likely metabolic encephalopathy with some delirium No indication for dialysis Medication Issues; Preferred narcotic agents for pain control are hydromorphone , fentanyl, and methadone. Morphine should not be used.  Baclofen should be avoided Avoid oral sodium phosphate  and magnesium citrate based laxatives / bowel preps    Jayson JINNY Player  10/14/2023, 10:33 AM  Recent Labs  Lab 10/12/23 0412 10/13/23 0410 10/14/23 0417  NA 127* 125* 123*  K 5.3* 4.9 4.3  CL 102 94* 88*  CO2 16* 20* 24  GLUCOSE 158* 143* 162*  BUN 69* 64* 63*  CREATININE 2.52* 2.40* 2.28*  CALCIUM 8.4* 8.6* 8.4*  PHOS  --  3.5  --  Recent Labs  Lab 10/11/23 0723 10/13/23 0410  WBC 10.0 11.2*  NEUTROABS 8.0*  --   HGB 9.9* 8.6*  HCT 29.8* 27.0*  MCV 89.5 90.3  PLT 268 266

## 2023-10-14 NOTE — Progress Notes (Signed)
 Mobility Specialist Progress Note:    10/14/23 1200  Mobility  Activity Refused mobility   Pt politely refused mobility d/t already being in chair and fatigue. All needs met.   Sherrilee Ditty Mobility Specialist Please contact via Special Educational Needs Teacher or  Rehab office at [phone]

## 2023-10-14 NOTE — Plan of Care (Signed)
  Problem: Health Behavior/Discharge Planning: Goal: Ability to manage health-related needs will improve Outcome: Progressing   Problem: Clinical Measurements: Goal: Ability to maintain clinical measurements within normal limits will improve Outcome: Progressing Goal: Will remain free from infection Outcome: Progressing Goal: Diagnostic test results will improve Outcome: Progressing Goal: Respiratory complications will improve Outcome: Progressing   Problem: Activity: Goal: Risk for activity intolerance will decrease Outcome: Progressing   Problem: Nutrition: Goal: Adequate nutrition will be maintained Outcome: Progressing   Problem: Elimination: Goal: Will not experience complications related to urinary retention Outcome: Progressing   Problem: Pain Management: Goal: General experience of comfort will improve Outcome: Progressing   Problem: Safety: Goal: Ability to remain free from injury will improve Outcome: Progressing   Problem: Skin Integrity: Goal: Risk for impaired skin integrity will decrease Outcome: Progressing   Problem: Fluid Volume: Goal: Ability to maintain a balanced intake and output will improve Outcome: Progressing   Problem: Metabolic: Goal: Ability to maintain appropriate glucose levels will improve Outcome: Progressing   Problem: Skin Integrity: Goal: Risk for impaired skin integrity will decrease Outcome: Progressing

## 2023-10-14 NOTE — Progress Notes (Signed)
 PROGRESS NOTE   Sherry Waller, is a 88 y.o. female, DOB - [birth year], FMW:[id number]  Admit date - 10/11/2023   Admitting Physician Kelcey Korus Pearlean, MD  Outpatient Primary MD for the patient is [name], MD  LOS - 2  Chief Complaint  Patient presents with   Shortness of Breath      Brief Narrative:   88 y.o. female with past medical history significant of hypothyroidism, depression, malignant HTN, DM2, CKD 3B, and  GERD admitted on 10/11/2023 with AKI on CKD and uncontrolled hypertension    -Assessment and Plan: 1)AKI----acute kidney injury on CKD stage -3B with an anion gap metabolic acidosis--- -?? AiN, ATN--  --in the setting of loose stools and poor oral intake --Creatinine is up to 2.23 >> 2.52> 2.40>>2.28 -creatinine was 1.1 on 10/01/2023 -BUN is up to 67 >>69 from 17 on 10/01/2023 -Bicarb is 18 >16>20>>24 -on 10/12/23-- Nephrologist Dr. Marlee recommended iV fluid with bicarb in dextrose  until oral intake is more reliable 10/14/23 -- on 10/14/23 Nephrologist Dr. Peebles--recommends stopping IV fluids and starting IV Lasix -- renally adjust medications, avoid nephrotoxic agents / dehydration  / hypotension    2)FUO----patient with fevers and chills since 10/11/2023 -fever and chills developed somewhat productive cough with greenish-yellowish sputum -WBCs 11.2 down from 16.3 on 10/01/2023 --Chest x-ray without acute cardiopulmonary findings -UA is not suggestive of UTI---completed antibiotics for E. coli UTI recently -COVID, RSV and flu negative --No further fevers   3)-D-dimer elevated at 6.59 with acute hypoxic respiratory failure she was found to have O2 sats of 87% required 2 L of oxygen via nasal cannula -VQ scan low probably for PE -ABG without hypercapnia ,  -- hypoxia noted on ABG with PaO2 of 61,  -COVID, RSV and flu testing requested --BNP 1005 no prior---- -troponin is down to 57 was 210 on 10/01/2023, EKG sinus rhythm -- Continue supplemental oxygen  and as needed bronchodilators   4)HypoNatremia---- sodium is 123 down from a baseline around 134 -In the setting of loose stools and poor oral intake prior to admission -Hydrate IV and orally   5)Acute Anemia-----Hgb is down to 8.6 baseline usually between 13 and 14, -Patient denies any blood in the stool, no emesis - stool for occult blood is neg -B12 is low at 136, replace, - folate WNL -Serum iron and iron saturation are low--replace -Anemia labs/workup as above   6)Malignant HTN--- patient has history of stage II hypertension with difficult to control BP from time to time -Systolic blood pressure was around 200 mmhg in the ED suspect some component of rebound hypertension in the setting of clonidine  use -Required nicardipine  drip during the recent hospitalization -Continue Coreg , clonidine ,  isosorbide /hydralazine  combo -Reduce Cardizem  CD to 120 mg daily from 240 mg due to bradycardia -IV hydralazine  as needed elevated BP -- BP improved with IV labetalol  and resumption of oral BP meds   7)History of Depression/Neuropathy -Continue treatment with Lexapro  and Neurontin    8)Hypothyroidism----Continue Synthroid    9)DM2-A1c 6.6 reflecting good diabetic control PTA -Use Novolog /Humalog Sliding scale insulin  with Accu-Cheks/Fingersticks as ordered  10)Hyperkalemia--- due to #1 above -Resolved with Lokelma   Status is: Inpatient   Disposition: The patient is from: Home              Anticipated d/c is to: Home              Anticipated d/c date is: 3 days              Patient currently is  not medically stable to d/c. Barriers: Not Clinically Stable-   Code Status :  -  Code Status: Full Code   Family Communication:   (patient is alert, awake and coherent)  Discussed with pts sons [name] Flores and Wes  DVT Prophylaxis  :   - SCDs  heparin  injection 5,000 Units Start: 10/11/23 2200 SCDs Start: 10/11/23 1450 Place TED hose Start: 10/11/23 1450   Lab Results  Component Value  Date   PLT 266 10/13/2023   Inpatient Medications  Scheduled Meds:  carvedilol   12.5 mg Oral BID WC   cloNIDine   0.3 mg Oral TID   diltiazem   120 mg Oral Daily   escitalopram   5 mg Oral Daily   ferrous sulfate   325 mg Oral Q breakfast   heparin   5,000 Units Subcutaneous Q8H   hydrALAZINE   100 mg Oral TID   insulin  aspart  0-5 Units Subcutaneous QHS   insulin  aspart  0-6 Units Subcutaneous TID WC   isosorbide  mononitrate  60 mg Oral Daily   levothyroxine   50 mcg Oral QAC breakfast   pantoprazole   40 mg Oral Daily   sodium chloride  flush  3 mL Intravenous Q12H   sodium chloride  flush  3 mL Intravenous Q12H   sodium chloride  flush  3-10 mL Intravenous Q12H   Continuous Infusions:  furosemide  120 mg (10/14/23 1633)   PRN Meds:.acetaminophen , albuterol , bisacodyl , hydrALAZINE , ondansetron  **OR** ondansetron  (ZOFRAN ) IV, oxyCODONE , polyethylene glycol, sodium chloride  flush, sodium chloride  flush, traZODone    Anti-infectives (From admission, onward)    None       Subjective: Sherry Waller today has no further fevers, no further emesis,  No chest pain,   -- -Patient's son Flores and patient's son was at bedside -Fatigue and malaise persist -Appetite is pretty poor  Objective: Vitals:   10/14/23 0320 10/14/23 0405 10/14/23 0942 10/14/23 1325  BP:  (!) 178/48 (!) 172/51 (!) 150/54  Pulse:  (!) 58 (!) 58 (!) 52  Resp:      Temp:  98.6 F (37 C)  (!) 97.5 F (36.4 C)  TempSrc:  Tympanic  Oral  SpO2:   95% 96%  Weight: 77.3 kg     Height:        Intake/Output Summary (Last 24 hours) at 10/14/2023 1724 Last data filed at 10/14/2023 1429 Gross per 24 hour  Intake 640 ml  Output 734 ml  Net -94 ml   Filed Weights   10/11/23 0637 10/13/23 0500 10/14/23 0320  Weight: 67 kg 82.3 kg 77.3 kg   Physical Exam  Gen:- Awake Alert, no acute distress HEENT:- Faxon.AT, No sclera icterus Nose-  L/min Neck-Supple Neck, +ve JVD,.  Lungs-no wheezing, fair symmetrical air  movement CV- S1, S2 normal, regular  Abd-  +ve B.Sounds, Abd Soft, No tenderness, no CVA area tenderness    Extremity/Skin:- +ve edema, pedal pulses present  Psych-affect is appropriate, oriented x3 Neuro-generalized weakness, no new focal deficits, no tremors GU-Foley catheter placed on 10/14/2023 by nephrology order-due to urinary retention and to keep accurate I's and O's  Data Reviewed: I have personally reviewed following labs and imaging studies  CBC: Recent Labs  Lab 10/11/23 0723 10/13/23 0410  WBC 10.0 11.2*  NEUTROABS 8.0*  --   HGB 9.9* 8.6*  HCT 29.8* 27.0*  MCV 89.5 90.3  PLT 268 266   Basic Metabolic Panel: Recent Labs  Lab 10/11/23 0640 10/12/23 0412 10/13/23 0410 10/14/23 0417  NA 126* 127* 125* 123*  K 4.9 5.3* 4.9  4.3  CL 98 102 94* 88*  CO2 18* 16* 20* 24  GLUCOSE 175* 158* 143* 162*  BUN 67* 69* 64* 63*  CREATININE 2.23* 2.52* 2.40* 2.28*  CALCIUM 8.8* 8.4* 8.6* 8.4*  MG 2.5*  --   --   --   PHOS  --   --  3.5  --    GFR: Estimated Creatinine Clearance: 18.5 mL/min (A) (by C-G formula based on SCr of 2.28 mg/dL (H)). Liver Function Tests: Recent Labs  Lab 10/11/23 0640 10/13/23 0410  AST 36  --   ALT 25  --   ALKPHOS 126  --   BILITOT 0.8  --   PROT 6.5  --   ALBUMIN  2.9* 2.5*   Cardiac Enzymes: Recent Labs  Lab 10/12/23 0412  CKTOTAL 405*   Recent Results (from the past 240 hours)  Resp panel by RT-PCR (RSV, Flu A&B, Covid)     Status: None   Collection Time: 10/11/23  4:15 PM  Result Value Ref Range Status   SARS Coronavirus 2 by RT PCR NEGATIVE NEGATIVE Final    Comment: (NOTE) SARS-CoV-2 target nucleic acids are NOT DETECTED.  The SARS-CoV-2 RNA is generally detectable in upper respiratory specimens during the acute phase of infection. The lowest concentration of SARS-CoV-2 viral copies this assay can detect is 138 copies/mL. A negative result does not preclude SARS-Cov-2 infection and should not be used as the sole basis  for treatment or other patient management decisions. A negative result may occur with  improper specimen collection/handling, submission of specimen other than nasopharyngeal swab, presence of viral mutation(s) within the areas targeted by this assay, and inadequate number of viral copies(<138 copies/mL). A negative result must be combined with clinical observations, patient history, and epidemiological information. The expected result is Negative.  Fact Sheet for Patients:  bloggercourse.com  Fact Sheet for Healthcare Providers:  seriousbroker.it  This test is no t yet approved or cleared by the [place]  FDA and  has been authorized for detection and/or diagnosis of SARS-CoV-2 by FDA under an Emergency Use Authorization (EUA). This EUA will remain  in effect (meaning this test can be used) for the duration of the COVID-19 declaration under Section 564(b)(1) of the Act, 21 U.S.C.section 360bbb-3(b)(1), unless the authorization is terminated  or revoked sooner.       Influenza A by PCR NEGATIVE NEGATIVE Final   Influenza B by PCR NEGATIVE NEGATIVE Final    Comment: (NOTE) The Xpert Xpress SARS-CoV-2/FLU/RSV plus assay is intended as an aid in the diagnosis of influenza from Nasopharyngeal swab specimens and should not be used as a sole basis for treatment. Nasal washings and aspirates are unacceptable for Xpert Xpress SARS-CoV-2/FLU/RSV testing.  Fact Sheet for Patients: bloggercourse.com  Fact Sheet for Healthcare Providers: seriousbroker.it  This test is not yet approved or cleared by the [place]  FDA and has been authorized for detection and/or diagnosis of SARS-CoV-2 by FDA under an Emergency Use Authorization (EUA). This EUA will remain in effect (meaning this test can be used) for the duration of the COVID-19 declaration under Section 564(b)(1) of the Act, 21  U.S.C. section 360bbb-3(b)(1), unless the authorization is terminated or revoked.     Resp Syncytial Virus by PCR NEGATIVE NEGATIVE Final    Comment: (NOTE) Fact Sheet for Patients: bloggercourse.com  Fact Sheet for Healthcare Providers: seriousbroker.it  This test is not yet approved or cleared by the [place]  FDA and has been authorized for detection and/or diagnosis of SARS-CoV-2  by FDA under an Emergency Use Authorization (EUA). This EUA will remain in effect (meaning this test can be used) for the duration of the COVID-19 declaration under Section 564(b)(1) of the Act, 21 U.S.C. section 360bbb-3(b)(1), unless the authorization is terminated or revoked.  Performed at [facility], [address] [place], [place] [address]      Scheduled Meds:  carvedilol   12.5 mg Oral BID WC   cloNIDine   0.3 mg Oral TID   diltiazem   120 mg Oral Daily   escitalopram   5 mg Oral Daily   ferrous sulfate   325 mg Oral Q breakfast   heparin   5,000 Units Subcutaneous Q8H   hydrALAZINE   100 mg Oral TID   insulin  aspart  0-5 Units Subcutaneous QHS   insulin  aspart  0-6 Units Subcutaneous TID WC   isosorbide  mononitrate  60 mg Oral Daily   levothyroxine   50 mcg Oral QAC breakfast   pantoprazole   40 mg Oral Daily   sodium chloride  flush  3 mL Intravenous Q12H   sodium chloride  flush  3 mL Intravenous Q12H   sodium chloride  flush  3-10 mL Intravenous Q12H   Continuous Infusions:  furosemide  120 mg (10/14/23 1633)    LOS: 2 days   Rendall Carwin M.D on 10/14/2023 at 5:24 PM  Go to [email or web address] - for contact info  [facility] - Office  [phone]  If 7PM-7AM, please contact night-coverage [email or web address] 10/14/2023, 5:24 PM

## 2023-10-15 ENCOUNTER — Inpatient Hospital Stay (HOSPITAL_COMMUNITY): Payer: Medicare HMO

## 2023-10-15 DIAGNOSIS — J9601 Acute respiratory failure with hypoxia: Secondary | ICD-10-CM | POA: Diagnosis not present

## 2023-10-15 LAB — HEPATIC FUNCTION PANEL
ALT: 13 U/L (ref 0–44)
AST: 12 U/L — ABNORMAL LOW (ref 15–41)
Albumin: 2.3 g/dL — ABNORMAL LOW (ref 3.5–5.0)
Alkaline Phosphatase: 92 U/L (ref 38–126)
Bilirubin, Direct: 0.1 mg/dL (ref 0.0–0.2)
Indirect Bilirubin: 0.3 mg/dL (ref 0.3–0.9)
Total Bilirubin: 0.4 mg/dL (ref 0.0–1.2)
Total Protein: 5.5 g/dL — ABNORMAL LOW (ref 6.5–8.1)

## 2023-10-15 LAB — BASIC METABOLIC PANEL
Anion gap: 8 (ref 5–15)
BUN: 69 mg/dL — ABNORMAL HIGH (ref 8–23)
CO2: 26 mmol/L (ref 22–32)
Calcium: 8 mg/dL — ABNORMAL LOW (ref 8.9–10.3)
Chloride: 84 mmol/L — ABNORMAL LOW (ref 98–111)
Creatinine, Ser: 2.5 mg/dL — ABNORMAL HIGH (ref 0.44–1.00)
GFR, Estimated: 18 mL/min — ABNORMAL LOW (ref >60)
Glucose, Bld: 196 mg/dL — ABNORMAL HIGH (ref 70–99)
Potassium: 4.6 mmol/L (ref 3.5–5.1)
Sodium: 118 mmol/L — CL (ref 135–145)

## 2023-10-15 LAB — CBC
HCT: 25.4 % — ABNORMAL LOW (ref 36.0–46.0)
Hemoglobin: 8.2 g/dL — ABNORMAL LOW (ref 12.0–15.0)
MCH: 28.7 pg (ref 26.0–34.0)
MCHC: 32.3 g/dL (ref 30.0–36.0)
MCV: 88.8 fL (ref 80.0–100.0)
Platelets: 288 10*3/uL (ref 150–400)
RBC: 2.86 MIL/uL — ABNORMAL LOW (ref 3.87–5.11)
RDW: 13.5 % (ref 11.5–15.5)
WBC: 9.7 10*3/uL (ref 4.0–10.5)
nRBC: 0 % (ref 0.0–0.2)

## 2023-10-15 LAB — RENAL FUNCTION PANEL
Albumin: 2.2 g/dL — ABNORMAL LOW (ref 3.5–5.0)
Anion gap: 9 (ref 5–15)
BUN: 65 mg/dL — ABNORMAL HIGH (ref 8–23)
CO2: 26 mmol/L (ref 22–32)
Calcium: 8.2 mg/dL — ABNORMAL LOW (ref 8.9–10.3)
Chloride: 86 mmol/L — ABNORMAL LOW (ref 98–111)
Creatinine, Ser: 2.31 mg/dL — ABNORMAL HIGH (ref 0.44–1.00)
GFR, Estimated: 20 mL/min — ABNORMAL LOW (ref >60)
Glucose, Bld: 119 mg/dL — ABNORMAL HIGH (ref 70–99)
Phosphorus: 4.3 mg/dL (ref 2.5–4.6)
Potassium: 4.6 mmol/L (ref 3.5–5.1)
Sodium: 121 mmol/L — ABNORMAL LOW (ref 135–145)

## 2023-10-15 LAB — TSH: TSH: 2.168 u[IU]/mL (ref 0.350–4.500)

## 2023-10-15 LAB — GLUCOSE, CAPILLARY
Glucose-Capillary: 133 mg/dL — ABNORMAL HIGH (ref 70–99)
Glucose-Capillary: 137 mg/dL — ABNORMAL HIGH (ref 70–99)
Glucose-Capillary: 138 mg/dL — ABNORMAL HIGH (ref 70–99)
Glucose-Capillary: 151 mg/dL — ABNORMAL HIGH (ref 70–99)

## 2023-10-15 LAB — CORTISOL-AM, BLOOD: Cortisol - AM: 19.8 ug/dL (ref 6.7–22.6)

## 2023-10-15 LAB — SODIUM
Sodium: 122 mmol/L — ABNORMAL LOW (ref 135–145)
Sodium: 122 mmol/L — ABNORMAL LOW (ref 135–145)

## 2023-10-15 LAB — SODIUM, URINE, RANDOM: Sodium, Ur: 13 mmol/L

## 2023-10-15 LAB — OSMOLALITY: Osmolality: 290 mosm/kg (ref 275–295)

## 2023-10-15 MED ORDER — FUROSEMIDE 10 MG/ML IJ SOLN
160.0000 mg | Freq: Four times a day (QID) | INTRAVENOUS | Status: AC
  Administered 2023-10-15 (×3): 160 mg via INTRAVENOUS
  Filled 2023-10-15 (×3): qty 16

## 2023-10-15 MED ORDER — ALBUMIN HUMAN 25 % IV SOLN
25.0000 g | Freq: Once | INTRAVENOUS | Status: AC
  Administered 2023-10-15: 25 g via INTRAVENOUS
  Filled 2023-10-15: qty 100

## 2023-10-15 MED ORDER — MEGESTROL ACETATE 400 MG/10ML PO SUSP
400.0000 mg | Freq: Every day | ORAL | Status: DC
  Administered 2023-10-15 – 2023-10-23 (×9): 400 mg via ORAL
  Filled 2023-10-15 (×9): qty 10

## 2023-10-15 MED ORDER — CHLORHEXIDINE GLUCONATE CLOTH 2 % EX PADS
6.0000 | MEDICATED_PAD | Freq: Every day | CUTANEOUS | Status: DC
  Administered 2023-10-15 – 2023-10-19 (×5): 6 via TOPICAL

## 2023-10-15 MED ORDER — SODIUM CHLORIDE 3 % IV SOLN
INTRAVENOUS | Status: DC
  Filled 2023-10-15 (×4): qty 500

## 2023-10-15 NOTE — Plan of Care (Signed)
  Problem: Health Behavior/Discharge Planning: Goal: Ability to manage health-related needs will improve Outcome: Progressing   Problem: Clinical Measurements: Goal: Ability to maintain clinical measurements within normal limits will improve Outcome: Progressing Goal: Will remain free from infection Outcome: Progressing Goal: Diagnostic test results will improve Outcome: Progressing Goal: Respiratory complications will improve Outcome: Progressing   Problem: Activity: Goal: Risk for activity intolerance will decrease Outcome: Progressing   Problem: Nutrition: Goal: Adequate nutrition will be maintained Outcome: Progressing   Problem: Elimination: Goal: Will not experience complications related to bowel motility Outcome: Progressing Goal: Will not experience complications related to urinary retention Outcome: Progressing   Problem: Pain Management: Goal: General experience of comfort will improve Outcome: Progressing   Problem: Safety: Goal: Ability to remain free from injury will improve Outcome: Progressing   Problem: Skin Integrity: Goal: Risk for impaired skin integrity will decrease Outcome: Progressing   Problem: Fluid Volume: Goal: Ability to maintain a balanced intake and output will improve Outcome: Progressing   Problem: Health Behavior/Discharge Planning: Goal: Ability to identify and utilize available resources and services will improve Outcome: Progressing Goal: Ability to manage health-related needs will improve Outcome: Progressing   Problem: Metabolic: Goal: Ability to maintain appropriate glucose levels will improve Outcome: Progressing   Problem: Skin Integrity: Goal: Risk for impaired skin integrity will decrease Outcome: Progressing

## 2023-10-15 NOTE — Care Management Important Message (Signed)
 Important Message  Patient Details  Name: Sherry Waller MRN: [id number] Date of Birth: 10-20-1935   Important Message Given:  Yes - Medicare IM     Corey Harold 10/15/2023, 2:53 PM

## 2023-10-15 NOTE — Progress Notes (Signed)
 Brief nephrology note  Worsening hyponatremia this afternoon. Still waiting on urine studies ordered this AM. Debatably symptomatic. Start hypertonic saline at 50cc/hr. Sodium checks every 4 hours.   Goal sodium is around 128 by 2pm on 1/4 but likely could even correct higher given there is a fairly large acute/subacute component. Continue with diuretics. Call nephrology if sodium worsens further or corrects too quickly

## 2023-10-15 NOTE — Progress Notes (Signed)
 Admit: 10/11/2023 LOS: 3  75F here with AoCKD3, NAGMA, hyponatremia, mild hyperkalemia after recent admission for UTI, with dyspnea and fatigue.   Subjective:  Patient with more shortness of breath.  Chest x-ray not read but looks to have more pulmonary edema or at least haziness.  Some urinary retention yesterday.  Foley catheter placed and family noted increased urine output as well as improved back pain.  Somewhat lethargic this morning but has noticed small improvement in mental status.  Sodium lower at 121 this morning.  Creatinine stable  01/02 0701 - 01/03 0700 In: 641.5 [P.O.:520; IV Piggyback:121.5] Out: 1334 [Urine:1334]  Filed Weights   10/13/23 0500 10/14/23 0320 10/15/23 0319  Weight: 82.3 kg 77.3 kg 76.2 kg    Scheduled Meds:  carvedilol   12.5 mg Oral BID WC   Chlorhexidine  Gluconate Cloth  6 each Topical Daily   cloNIDine   0.3 mg Oral TID   diltiazem   120 mg Oral Daily   escitalopram   5 mg Oral Daily   ferrous sulfate   325 mg Oral Q breakfast   heparin   5,000 Units Subcutaneous Q8H   hydrALAZINE   100 mg Oral TID   insulin  aspart  0-5 Units Subcutaneous QHS   insulin  aspart  0-6 Units Subcutaneous TID WC   isosorbide  mononitrate  60 mg Oral Daily   levothyroxine   50 mcg Oral QAC breakfast   pantoprazole   40 mg Oral Daily   sodium chloride  flush  3 mL Intravenous Q12H   sodium chloride  flush  3-10 mL Intravenous Q12H   Continuous Infusions:  furosemide      PRN Meds:.acetaminophen , albuterol , bisacodyl , hydrALAZINE , ondansetron  **OR** ondansetron  (ZOFRAN ) IV, oxyCODONE , polyethylene glycol, sodium chloride  flush, traZODone   Current Labs: reviewed   Physical Exam:  Blood pressure (!) 153/48, pulse (!) 55, temperature 99 F (37.2 C), resp. rate 15, height 5' 5 (1.651 m), weight 76.2 kg, SpO2 97%. GEN: lying in bed, ill-appearing, no distress ENT: NCAT, no nasal discharge CV: normal rate, no rub PULM: Crackles present, mild increased work of breathing ABD:  Soft, nontender EXT: 1+ edema in all 4 extremities, warm and well-perfused  A AoCKD3, oliguric: Low Fena.  Initially felt to be hypovolemic.  Likely some degree of tubular injury.  Now with volume overload.  Improving urine output with diuretics Likely solitary right kidney with chronic left renal atrophy Chronic subnephrotic proteinuria on ARB as outpatient Resistant hypertension on 7 agents as an outpatient including olmesartan  NAGMA, improved Hypervolemic hyponatremia: Secondary to AKI.  Recent echo with no decreased EF.  Current symptoms unlikely related to hyponatremia but possibly mild symptoms Mild hyperkalemia, driven by #1, #3, ARB use, resolved New anemia : Iron deficiency and some B12 deficiency.  Per to primary Hemoglobinuria without hematuria, CK was normal.  Repeat urinalysis without hematuria  P Increase IV Lasix  260 mg x 3 doses today Likely reorder Lasix  tomorrow Repeat sodium at noon and consider hypertonic saline versus tolvaptan at that time.  Leaning towards hypertonic saline given her suboptimal kidney function Monitor altered mental status closely: Likely metabolic encephalopathy with some delirium No indication for dialysis Medication Issues; Preferred narcotic agents for pain control are hydromorphone , fentanyl, and methadone. Morphine should not be used.  Baclofen should be avoided Avoid oral sodium phosphate  and magnesium citrate based laxatives / bowel preps    Sherry Waller Player  10/15/2023, 9:45 AM  Recent Labs  Lab 10/13/23 0410 10/14/23 0417 10/15/23 0335  NA 125* 123* 121*  K 4.9 4.3 4.6  CL 94* 88*  86*  CO2 20* 24 26  GLUCOSE 143* 162* 119*  BUN 64* 63* 65*  CREATININE 2.40* 2.28* 2.31*  CALCIUM 8.6* 8.4* 8.2*  PHOS 3.5  --  4.3   Recent Labs  Lab 10/11/23 0723 10/13/23 0410 10/15/23 0335  WBC 10.0 11.2* 9.7  NEUTROABS 8.0*  --   --   HGB 9.9* 8.6* 8.2*  HCT 29.8* 27.0* 25.4*  MCV 89.5 90.3 88.8  PLT 268 266 288

## 2023-10-15 NOTE — Progress Notes (Signed)
 PROGRESS NOTE   Sherry Waller, is a 88 y.o. female, DOB - 1936-01-09, FMW:[id number]  Admit date - 10/11/2023   Admitting Physician Sherry Pearlean, MD  Outpatient Primary MD for the patient is Sherry Rush, MD  LOS - 3  Chief Complaint  Patient presents with   Shortness of Breath      Brief Narrative:   Sherry Waller is  88 y.o. female with past medical history significant of hypothyroidism, depression, malignant HTN, DM2, CKD 3B, and  GERD admitted on 10/11/2023 with AKI on CKD and uncontrolled hypertension    -Assessment and Plan:  1)AKI----acute kidney injury on CKD stage -3B with an anion gap metabolic acidosis--- -?? AiN, ATN--  --in the setting of loose stools and poor oral intake --Creatinine is up to 2.23 >> 2.52> 2.40>>2.28 -creatinine was 1.1 on 10/01/2023 Lab Results  Component Value Date   CREATININE 2.31 (H) 10/15/2023   CREATININE 2.28 (H) 10/14/2023   CREATININE 2.40 (H) 10/13/2023   -BUN is up to 67 >>69 from 17 on 10/01/2023 -Bicarb is 18 >16>20>>24, 26  -on 10/12/23-- Nephrologist Sherry Waller recommended iV fluid with bicarb in dextrose  until oral intake is more reliable 10/14/23 -- on 10/14/23 Nephrologist Sherry Waller--recommends stopping IV fluids and starting IV Lasix -- renally adjust medications, avoid nephrotoxic agents / dehydration  / hypotension 10/15/23  -Still having poor p.o. intake   Intake/Output Summary (Last 24 hours) at 10/15/2023 1107 Last data filed at 10/15/2023 0909 Gross per 24 hour  Intake 681.45 ml  Output 1334 ml  Net -652.55 ml       2)FUO----patient with fevers and chills since 10/11/2023 -fever and chills developed somewhat productive cough with greenish-yellowish sputum -WBCs 9.7  down from 16.3 on 10/01/2023  --Chest x-ray without acute cardiopulmonary findings -UA is not suggestive of UTI---completed antibiotics for E. coli UTI recently -COVID, RSV and flu negative --No further fevers   3)-D-dimer elevated at 6.59  with acute hypoxic respiratory failure she was found to have O2 sats of 87% required 2 L of oxygen via nasal cannula -VQ scan low probably for PE -ABG without hypercapnia ,  -- hypoxia noted on ABG with PaO2 of 61,  -COVID, RSV and flu testing requested --BNP 1005 no prior---- -troponin is down to 57 was 210 on 10/01/2023, EKG sinus rhythm -- Continue supplemental oxygen and as needed bronchodilators   4)HypoNatremia---- sodium is 121 down from a baseline around 134 -In the setting of loose stools and poor oral intake prior to admission -Hydrate IV --discontinued --encouraging oral intake    5)Acute Anemia-----Hgb is down to 8.6 baseline usually between 13 and 14, -Patient denies any blood in the stool, no emesis - stool for occult blood is neg -B12 is low at 136, replace, - folate WNL -Serum iron and iron saturation are low--replace -Anemia labs/workup as above   6)Malignant HTN--- patient has history of stage II hypertension with difficult to control BP from time to time -Systolic blood pressure was around 200 mmhg in the ED suspect some component of rebound hypertension in the setting of clonidine  use -Required nicardipine  drip during the recent hospitalization -Continue Coreg , clonidine ,  isosorbide /hydralazine  combo -Reduce Cardizem  CD to 120 mg daily from 240 mg due to bradycardia -IV hydralazine  as needed elevated BP -- BP improved with IV labetalol  and resumption of oral BP meds   7)History of Depression/Neuropathy -Continue treatment with Lexapro  and Neurontin    8)Hypothyroidism----Continue Synthroid    9)DM2-A1c 6.6 reflecting good diabetic control PTA -Use Novolog Sherry Waller  Sliding scale insulin  with Accu-Cheks/Fingersticks as ordered  10)Hyperkalemia--- due to #1 above -Resolved with Lokelma   Status is: Inpatient   Disposition: The patient is from: Home              Anticipated d/c is to: Home              Anticipated d/c date is: 3 days              Patient  currently is not medically stable to d/c. Barriers: Not Clinically Stable-   Code Status :  -  Code Status: Full Code   Family Communication:   (patient is alert, awake and coherent)  Discussed with pts sons Sherry Waller and Sherry Waller  DVT Prophylaxis  :   - SCDs  heparin  injection 5,000 Units Start: 10/11/23 2200 SCDs Start: 10/11/23 1450 Place TED hose Start: 10/11/23 1450   Lab Results  Component Value Date   PLT 288 10/15/2023   Inpatient Medications  Scheduled Meds:  carvedilol   12.5 mg Oral BID WC   Chlorhexidine  Gluconate Cloth  6 each Topical Daily   cloNIDine   0.3 mg Oral TID   diltiazem   120 mg Oral Daily   escitalopram   5 mg Oral Daily   ferrous sulfate   325 mg Oral Q breakfast   heparin   5,000 Units Subcutaneous Q8H   hydrALAZINE   100 mg Oral TID   insulin  aspart  0-5 Units Subcutaneous QHS   insulin  aspart  0-6 Units Subcutaneous TID WC   isosorbide  mononitrate  60 mg Oral Daily   levothyroxine   50 mcg Oral QAC breakfast   pantoprazole   40 mg Oral Daily   sodium chloride  flush  3 mL Intravenous Q12H   sodium chloride  flush  3-10 mL Intravenous Q12H   Continuous Infusions:  furosemide  160 mg (10/15/23 1058)   PRN Meds:.acetaminophen , albuterol , bisacodyl , hydrALAZINE , ondansetron  **OR** ondansetron  (ZOFRAN ) IV, oxyCODONE , polyethylene glycol, sodium chloride  flush, traZODone    Anti-infectives (From admission, onward)    None       Subjective: Sherry Waller  today has no further fevers, no further emesis,  No chest pain,   -- -Patient's son Waller and patient's son was at bedside -Fatigue and malaise persist -Appetite is pretty poor  Objective: Vitals:   10/14/23 1956 10/14/23 2000 10/15/23 0319 10/15/23 0908  BP: (!) 150/37 (!) 150/37 (!) 122/42 (!) 153/48  Pulse: (!) 53 (!) 53 (!) 53 (!) 55  Resp:   15   Temp: 97.9 F (36.6 C) 97.9 F (36.6 C) 99 F (37.2 C)   TempSrc: Oral Oral    SpO2: 96%  97%   Weight:   76.2 kg   Height:        Intake/Output  Summary (Last 24 hours) at 10/15/2023 1104 Last data filed at 10/15/2023 0909 Gross per 24 hour  Intake 681.45 ml  Output 1334 ml  Net -652.55 ml   Filed Weights   10/13/23 0500 10/14/23 0320 10/15/23 0319  Weight: 82.3 kg 77.3 kg 76.2 kg   Physical Exam      General:  AAO x 3,  cooperative, no distress;   HEENT:  Normocephalic, PERRL, otherwise with in Normal limits   Neuro:  CNII-XII intact. , normal motor and sensation, reflexes intact   Lungs:   Clear to auscultation BL, Respirations unlabored,  No wheezes / crackles  Cardio:    S1/S2, RRR, No murmure, No Rubs or Gallops   Abdomen:  Soft, non-tender, bowel sounds active all four quadrants,  no guarding or peritoneal signs.  Muscular  skeletal:  Limited exam -global generalized weaknesses - in bed, able to move all 4 extremities,   2+ pulses,  symmetric, No pitting edema  Skin:  Dry, warm to touch, negative for any Rashes,  Wounds: Please see nursing documentation    GU-Foley catheter placed on 10/14/2023 by nephrology order-due to urinary retention and to keep accurate I's and O's  Data Reviewed: I have personally reviewed following labs and imaging studies  CBC: Recent Labs  Lab 10/11/23 0723 10/13/23 0410 10/15/23 0335  WBC 10.0 11.2* 9.7  NEUTROABS 8.0*  --   --   HGB 9.9* 8.6* 8.2*  HCT 29.8* 27.0* 25.4*  MCV 89.5 90.3 88.8  PLT 268 266 288   Basic Metabolic Panel: Recent Labs  Lab 10/11/23 0640 10/12/23 0412 10/13/23 0410 10/14/23 0417 10/15/23 0335  NA 126* 127* 125* 123* 121*  K 4.9 5.3* 4.9 4.3 4.6  CL 98 102 94* 88* 86*  CO2 18* 16* 20* 24 26  GLUCOSE 175* 158* 143* 162* 119*  BUN 67* 69* 64* 63* 65*  CREATININE 2.23* 2.52* 2.40* 2.28* 2.31*  CALCIUM 8.8* 8.4* 8.6* 8.4* 8.2*  MG 2.5*  --   --   --   --   PHOS  --   --  3.5  --  4.3   GFR: Estimated Creatinine Clearance: 18.2 mL/min (A) (by C-G formula based on SCr of 2.31 mg/dL (H)). Liver Function Tests: Recent Labs  Lab 10/11/23 0640  10/13/23 0410 10/15/23 0335  AST 36  --  12*  ALT 25  --  13  ALKPHOS 126  --  92  BILITOT 0.8  --  0.4  PROT 6.5  --  5.5*  ALBUMIN  2.9* 2.5* 2.3*  2.2*   Cardiac Enzymes: Recent Labs  Lab 10/12/23 0412  CKTOTAL 405*   Recent Results (from the past 240 hours)  Resp panel by RT-PCR (RSV, Flu A&B, Covid)     Status: None   Collection Time: 10/11/23  4:15 PM  Result Value Ref Range Status   SARS Coronavirus 2 by RT PCR NEGATIVE NEGATIVE Final    Comment: (NOTE) SARS-CoV-2 target nucleic acids are NOT DETECTED.  The SARS-CoV-2 RNA is generally detectable in upper respiratory specimens during the acute phase of infection. The lowest concentration of SARS-CoV-2 viral copies this assay can detect is 138 copies/mL. A negative result does not preclude SARS-Cov-2 infection and should not be used as the sole basis for treatment or other patient management decisions. A negative result may occur with  improper specimen collection/handling, submission of specimen other than nasopharyngeal swab, presence of viral mutation(s) within the areas targeted by this assay, and inadequate number of viral copies(<138 copies/mL). A negative result must be combined with clinical observations, patient history, and epidemiological information. The expected result is Negative.  Fact Sheet for Patients:  bloggercourse.com  Fact Sheet for Healthcare Providers:  seriousbroker.it  This test is no t yet approved or cleared by the [place]  FDA and  has been authorized for detection and/or diagnosis of SARS-CoV-2 by FDA under an Emergency Use Authorization (EUA). This EUA will remain  in effect (meaning this test can be used) for the duration of the COVID-19 declaration under Section 564(b)(1) of the Act, 21 U.S.C.section 360bbb-3(b)(1), unless the authorization is terminated  or revoked sooner.       Influenza A by PCR NEGATIVE NEGATIVE Final    Influenza B by PCR NEGATIVE NEGATIVE Final  Comment: (NOTE) The Xpert Xpress SARS-CoV-2/FLU/RSV plus assay is intended as an aid in the diagnosis of influenza from Nasopharyngeal swab specimens and should not be used as a sole basis for treatment. Nasal washings and aspirates are unacceptable for Xpert Xpress SARS-CoV-2/FLU/RSV testing.  Fact Sheet for Patients: bloggercourse.com  Fact Sheet for Healthcare Providers: seriousbroker.it  This test is not yet approved or cleared by the [place]  FDA and has been authorized for detection and/or diagnosis of SARS-CoV-2 by FDA under an Emergency Use Authorization (EUA). This EUA will remain in effect (meaning this test can be used) for the duration of the COVID-19 declaration under Section 564(b)(1) of the Act, 21 U.S.C. section 360bbb-3(b)(1), unless the authorization is terminated or revoked.     Resp Syncytial Virus by PCR NEGATIVE NEGATIVE Final    Comment: (NOTE) Fact Sheet for Patients: bloggercourse.com  Fact Sheet for Healthcare Providers: seriousbroker.it  This test is not yet approved or cleared by the [place]  FDA and has been authorized for detection and/or diagnosis of SARS-CoV-2 by FDA under an Emergency Use Authorization (EUA). This EUA will remain in effect (meaning this test can be used) for the duration of the COVID-19 declaration under Section 564(b)(1) of the Act, 21 U.S.C. section 360bbb-3(b)(1), unless the authorization is terminated or revoked.  Performed at [facility], [address], [place], [place] [address]      Scheduled Meds:  carvedilol   12.5 mg Oral BID WC   Chlorhexidine  Gluconate Cloth  6 each Topical Daily   cloNIDine   0.3 mg Oral TID   diltiazem   120 mg Oral Daily   escitalopram   5 mg Oral Daily   ferrous sulfate   325 mg Oral Q breakfast   heparin   5,000 Units Subcutaneous Q8H    hydrALAZINE   100 mg Oral TID   insulin  aspart  0-5 Units Subcutaneous QHS   insulin  aspart  0-6 Units Subcutaneous TID WC   isosorbide  mononitrate  60 mg Oral Daily   levothyroxine   50 mcg Oral QAC breakfast   pantoprazole   40 mg Oral Daily   sodium chloride  flush  3 mL Intravenous Q12H   sodium chloride  flush  3-10 mL Intravenous Q12H   Continuous Infusions:  furosemide  160 mg (10/15/23 1058)    LOS: 3 days   Adriana DELENA Grams M.D on 10/15/2023 at 11:04 AM  Go to [email or web address] - for contact info  [facility] - Office  [phone]  If 7PM-7AM, please contact night-coverage [email or web address] 10/15/2023, 11:04 AM

## 2023-10-15 NOTE — Progress Notes (Signed)
 Noted that her sodium level rose 4 mEq in 3 hours and told to decrease 3% rate to 20cc/hr.  Na stable at 122 after 6 hours.  Will continue to follow sodium levels and if Na level continues to climb would hold 3% drip.

## 2023-10-15 NOTE — Plan of Care (Signed)
   Problem: Nutrition: Goal: Adequate nutrition will be maintained Outcome: Not Progressing

## 2023-10-16 DIAGNOSIS — J9601 Acute respiratory failure with hypoxia: Secondary | ICD-10-CM | POA: Diagnosis not present

## 2023-10-16 LAB — SODIUM
Sodium: 123 mmol/L — ABNORMAL LOW (ref 135–145)
Sodium: 125 mmol/L — ABNORMAL LOW (ref 135–145)
Sodium: 126 mmol/L — ABNORMAL LOW (ref 135–145)

## 2023-10-16 LAB — GLUCOSE, CAPILLARY
Glucose-Capillary: 142 mg/dL — ABNORMAL HIGH (ref 70–99)
Glucose-Capillary: 154 mg/dL — ABNORMAL HIGH (ref 70–99)
Glucose-Capillary: 109 mg/dL — ABNORMAL HIGH (ref 70–99)
Glucose-Capillary: 130 mg/dL — ABNORMAL HIGH (ref 70–99)

## 2023-10-16 LAB — LIPID PANEL
Cholesterol: 121 mg/dL (ref 0–200)
HDL: 22 mg/dL — ABNORMAL LOW (ref >40)
LDL Cholesterol: 77 mg/dL (ref 0–99)
Total CHOL/HDL Ratio: 5.5 {ratio}
Triglycerides: 108 mg/dL (ref <150)
VLDL: 22 mg/dL (ref 0–40)

## 2023-10-16 LAB — BASIC METABOLIC PANEL
Anion gap: 9 (ref 5–15)
BUN: 66 mg/dL — ABNORMAL HIGH (ref 8–23)
CO2: 24 mmol/L (ref 22–32)
Calcium: 8.4 mg/dL — ABNORMAL LOW (ref 8.9–10.3)
Chloride: 90 mmol/L — ABNORMAL LOW (ref 98–111)
Creatinine, Ser: 2.46 mg/dL — ABNORMAL HIGH (ref 0.44–1.00)
GFR, Estimated: 19 mL/min — ABNORMAL LOW (ref >60)
Glucose, Bld: 199 mg/dL — ABNORMAL HIGH (ref 70–99)
Potassium: 4.5 mmol/L (ref 3.5–5.1)
Sodium: 123 mmol/L — ABNORMAL LOW (ref 135–145)

## 2023-10-16 LAB — OSMOLALITY, URINE: Osmolality, Ur: 236 mosm/kg — ABNORMAL LOW (ref 300–900)

## 2023-10-16 MED ORDER — CLONIDINE HCL 0.2 MG PO TABS
0.2000 mg | ORAL_TABLET | Freq: Three times a day (TID) | ORAL | Status: DC
  Administered 2023-10-16 (×3): 0.2 mg via ORAL
  Filled 2023-10-16 (×3): qty 1

## 2023-10-16 NOTE — Plan of Care (Signed)
   Problem: Health Behavior/Discharge Planning: Goal: Ability to manage health-related needs will improve Outcome: Progressing   Problem: Clinical Measurements: Goal: Ability to maintain clinical measurements within normal limits will improve Outcome: Progressing Goal: Will remain free from infection Outcome: Progressing

## 2023-10-16 NOTE — Progress Notes (Signed)
 PROGRESS NOTE   Sherry Waller, is a 88 y.o. female, DOB - April 18, 1936, FMW:[id number]  Admit date - 10/11/2023   Admitting Physician Courage Pearlean, MD  Outpatient Primary MD for the patient is [name], MD  LOS - 4  Chief Complaint  Patient presents with   Shortness of Breath      Brief Narrative:   Sherry Waller is  88 y.o. female with past medical history significant of hypothyroidism, depression, malignant HTN, DM2, CKD 3B, and  GERD admitted on 10/11/2023 with AKI on CKD and uncontrolled hypertension     Subjective: The patient was seen and examined this morning, laying in bed comfortable, mild lethargic, upper and lower extremity edema noted complaint per patient and family -Remain in ICU due to severe hyponatremia On hypertonic solution per nephrology sodium slowly improving   -Both sons present at bedside     -Assessment and Plan:  1)AKI----acute kidney injury on CKD stage -3B with an anion gap metabolic acidosis--- -?? AiN, ATN--  --in the setting of loose stools and poor oral intake --Creatinine is up to 2.23 >> 2.50,  -creatinine was 1.1 on 10/01/2023 Lab Results  Component Value Date   CREATININE 2.46 (H) 10/16/2023   CREATININE 2.50 (H) 10/15/2023   CREATININE 2.31 (H) 10/15/2023   -BUN is up to 67 >>69 from 17 on 10/01/2023 -Bicarb is 18 >16>20>>24, 26  -on 10/12/23-- Nephrologist Dr. Marlee recommended iV fluid with bicarb in dextrose  until oral intake is more reliable 10/14/23 -- on 10/14/23 Nephrologist Dr. Peebles--recommends stopping IV fluids and starting IV Lasix -- renally adjust medications, avoid nephrotoxic agents / dehydration  / hypotension 10/15/23  -Still having poor p.o. intake -Serum sodium dropped to 118, was started on 3% normal saline per nephrologist  10/16/2023 -Remained on 3% normal saline overnight, -Serum sodium continues to improve-118>>> 126 this morning Per nephrology recommendation once between 125-130 with stop 3%  normal saline -Nephrologist concern for possibility of multiple myeloma which could cause isotonic hyponatremia Workup in progress     Intake/Output Summary (Last 24 hours) at 10/16/2023 1303 Last data filed at 10/16/2023 9062 Gross per 24 hour  Intake 515.32 ml  Output 2150 ml  Net -1634.68 ml       2)FUO----patient with fevers and chills since 10/11/2023 -fever and chills developed somewhat productive cough with greenish-yellowish sputum -WBCs 9.7  down from 16.3 on 10/01/2023  --Chest x-ray without acute cardiopulmonary findings -UA is not suggestive of UTI---completed antibiotics for E. coli UTI recently -COVID, RSV and flu negative --No further fevers   3)-D-dimer elevated at 6.59 with acute hypoxic respiratory failure she was found to have O2 sats of 87% required 2 L of oxygen via nasal cannula -VQ scan low probably for PE -ABG without hypercapnia ,  -- hypoxia noted on ABG with PaO2 of 61,  -COVID, RSV and flu testing requested --BNP 1005 no prior---- -troponin is down to 57 was 210 on 10/01/2023, EKG sinus rhythm -- Continue supplemental oxygen and as needed bronchodilators   4)HypoNatremia----serum sodium as low as 118, improved with 3% hypertonic saline  Per nephrologist to stop the 3% normal saline once serum sodium is 125-130 Workup not consistent with SIADH, serum osmolality at 290, normal TSH, cortisol level,  5)Acute Anemia-----Hgb is down to 8.6 baseline usually between 13 and 14, -Patient denies any blood in the stool, no emesis - stool for occult blood is neg -B12 is low at 136, replace, - folate WNL -Serum iron and iron saturation are  low--replace -Anemia labs/workup as above -Concern for multiple myeloma- light chains and myeloma panel has been ordered   6)Malignant HTN--- patient has history of stage II hypertension with difficult to control BP from time to time -Systolic blood pressure was around 200 mmhg in the ED suspect some component of rebound  hypertension in the setting of clonidine  use -Required nicardipine  drip during the recent hospitalization -Continue Coreg , clonidine ,  isosorbide /hydralazine  combo -Reduce Cardizem  CD to 120 mg daily from 240 mg due to bradycardia -IV hydralazine  as needed elevated BP -- BP improved with IV labetalol  and resumption of oral BP meds   7)History of Depression/Neuropathy -Continue treatment with Lexapro  and Neurontin    8)Hypothyroidism----Continue Synthroid    9)DM2-A1c 6.6 reflecting good diabetic control PTA -Use Novolog /Humalog Sliding scale insulin  with Accu-Cheks/Fingersticks as ordered  10)Hyperkalemia--- due to #1 above -Resolved with Lokelma   Status is: Inpatient   Disposition: The patient is from: Home              Anticipated d/c is to: Home              Anticipated d/c date is: 3 days              Patient currently is not medically stable to d/c. Barriers: Not Clinically Stable-   Code Status :  -  Code Status: Full Code   Family Communication:   (patient is alert, awake and coherent)  Discussed with pts sons [name] Flores and Wes  DVT Prophylaxis  :   - SCDs  heparin  injection 5,000 Units Start: 10/11/23 2200 SCDs Start: 10/11/23 1450 Place TED hose Start: 10/11/23 1450   Lab Results  Component Value Date   PLT 288 10/15/2023   Inpatient Medications  Scheduled Meds:  carvedilol   12.5 mg Oral BID WC   Chlorhexidine  Gluconate Cloth  6 each Topical Daily   cloNIDine   0.2 mg Oral TID   diltiazem   120 mg Oral Daily   escitalopram   5 mg Oral Daily   ferrous sulfate   325 mg Oral Q breakfast   heparin   5,000 Units Subcutaneous Q8H   hydrALAZINE   100 mg Oral TID   insulin  aspart  0-5 Units Subcutaneous QHS   insulin  aspart  0-6 Units Subcutaneous TID WC   isosorbide  mononitrate  60 mg Oral Daily   levothyroxine   50 mcg Oral QAC breakfast   megestrol   400 mg Oral Daily   sodium chloride  flush  3 mL Intravenous Q12H   sodium chloride  flush  3-10 mL Intravenous Q12H    Continuous Infusions:   PRN Meds:.acetaminophen , albuterol , bisacodyl , hydrALAZINE , ondansetron  **OR** ondansetron  (ZOFRAN ) IV, oxyCODONE , polyethylene glycol, sodium chloride  flush, traZODone    Anti-infectives (From admission, onward)    None        Objective: Vitals:   10/16/23 0755 10/16/23 0820 10/16/23 0953 10/16/23 1200  BP:  (!) 164/28 (!) 173/33   Pulse:      Resp:  15 18   Temp: 98.2 F (36.8 C)   98.3 F (36.8 C)  TempSrc: Oral   Oral  SpO2:      Weight:      Height:        Intake/Output Summary (Last 24 hours) at 10/16/2023 1303 Last data filed at 10/16/2023 9062 Gross per 24 hour  Intake 515.32 ml  Output 2150 ml  Net -1634.68 ml   Filed Weights   10/14/23 0320 10/15/23 0319 10/15/23 1557  Weight: 77.3 kg 76.2 kg 84.7 kg   Physical Exam  General:  Laying in bed comfortably, lethargic, but arousable  HEENT:  Normocephalic, PERRL, otherwise with in Normal limits   Neuro:  Lethargic CNII-XII intact. , normal motor and sensation, reflexes intact   Lungs:   Clear to auscultation BL, Respirations unlabored,  No wheezes / crackles  Cardio:    S1/S2, RRR, No murmure, No Rubs or Gallops   Abdomen:  Soft, non-tender, bowel sounds active all four quadrants, no guarding or peritoneal signs.  Muscular  skeletal:  Limited exam -global generalized weaknesses - in bed, able to move all 4 extremities,   2+ pulses,  symmetric, + pitting edema  Skin:  Dry, warm to touch, negative for any Rashes,  Wounds: Please see nursing documentation         GU-Foley catheter placed on 10/14/2023 by nephrology order-due to urinary retention and to keep accurate I's and O's  Data Reviewed: I have personally reviewed following labs and imaging studies  CBC: Recent Labs  Lab 10/11/23 0723 10/13/23 0410 10/15/23 0335  WBC 10.0 11.2* 9.7  NEUTROABS 8.0*  --   --   HGB 9.9* 8.6* 8.2*  HCT 29.8* 27.0* 25.4*  MCV 89.5 90.3 88.8  PLT 268 266 288   Basic Metabolic  Panel: Recent Labs  Lab 10/11/23 0640 10/12/23 0412 10/13/23 0410 10/14/23 0417 10/15/23 0335 10/15/23 1354 10/15/23 1645 10/15/23 2012 10/16/23 0010 10/16/23 0350 10/16/23 0720 10/16/23 1211  NA 126*   < > 125* 123* 121* 118*   < > 122* 123* 123* 125* 126*  K 4.9   < > 4.9 4.3 4.6 4.6  --   --   --  4.5  --   --   CL 98   < > 94* 88* 86* 84*  --   --   --  90*  --   --   CO2 18*   < > 20* 24 26 26   --   --   --  24  --   --   GLUCOSE 175*   < > 143* 162* 119* 196*  --   --   --  199*  --   --   BUN 67*   < > 64* 63* 65* 69*  --   --   --  66*  --   --   CREATININE 2.23*   < > 2.40* 2.28* 2.31* 2.50*  --   --   --  2.46*  --   --   CALCIUM 8.8*   < > 8.6* 8.4* 8.2* 8.0*  --   --   --  8.4*  --   --   MG 2.5*  --   --   --   --   --   --   --   --   --   --   --   PHOS  --   --  3.5  --  4.3  --   --   --   --   --   --   --    < > = values in this interval not displayed.   GFR: Estimated Creatinine Clearance: 18 mL/min (A) (by C-G formula based on SCr of 2.46 mg/dL (H)). Liver Function Tests: Recent Labs  Lab 10/11/23 0640 10/13/23 0410 10/15/23 0335  AST 36  --  12*  ALT 25  --  13  ALKPHOS 126  --  92  BILITOT 0.8  --  0.4  PROT 6.5  --  5.5*  ALBUMIN  2.9* 2.5* 2.3*  2.2*   Cardiac Enzymes: Recent Labs  Lab 10/12/23 0412  CKTOTAL 405*   Recent Results (from the past 240 hours)  Resp panel by RT-PCR (RSV, Flu A&B, Covid)     Status: None   Collection Time: 10/11/23  4:15 PM  Result Value Ref Range Status   SARS Coronavirus 2 by RT PCR NEGATIVE NEGATIVE Final    Comment: (NOTE) SARS-CoV-2 target nucleic acids are NOT DETECTED.  The SARS-CoV-2 RNA is generally detectable in upper respiratory specimens during the acute phase of infection. The lowest concentration of SARS-CoV-2 viral copies this assay can detect is 138 copies/mL. A negative result does not preclude SARS-Cov-2 infection and should not be used as the sole basis for treatment or other patient  management decisions. A negative result may occur with  improper specimen collection/handling, submission of specimen other than nasopharyngeal swab, presence of viral mutation(s) within the areas targeted by this assay, and inadequate number of viral copies(<138 copies/mL). A negative result must be combined with clinical observations, patient history, and epidemiological information. The expected result is Negative.  Fact Sheet for Patients:  bloggercourse.com  Fact Sheet for Healthcare Providers:  seriousbroker.it  This test is no t yet approved or cleared by the [place]  FDA and  has been authorized for detection and/or diagnosis of SARS-CoV-2 by FDA under an Emergency Use Authorization (EUA). This EUA will remain  in effect (meaning this test can be used) for the duration of the COVID-19 declaration under Section 564(b)(1) of the Act, 21 U.S.C.section 360bbb-3(b)(1), unless the authorization is terminated  or revoked sooner.       Influenza A by PCR NEGATIVE NEGATIVE Final   Influenza B by PCR NEGATIVE NEGATIVE Final    Comment: (NOTE) The Xpert Xpress SARS-CoV-2/FLU/RSV plus assay is intended as an aid in the diagnosis of influenza from Nasopharyngeal swab specimens and should not be used as a sole basis for treatment. Nasal washings and aspirates are unacceptable for Xpert Xpress SARS-CoV-2/FLU/RSV testing.  Fact Sheet for Patients: bloggercourse.com  Fact Sheet for Healthcare Providers: seriousbroker.it  This test is not yet approved or cleared by the [place]  FDA and has been authorized for detection and/or diagnosis of SARS-CoV-2 by FDA under an Emergency Use Authorization (EUA). This EUA will remain in effect (meaning this test can be used) for the duration of the COVID-19 declaration under Section 564(b)(1) of the Act, 21 U.S.C. section 360bbb-3(b)(1),  unless the authorization is terminated or revoked.     Resp Syncytial Virus by PCR NEGATIVE NEGATIVE Final    Comment: (NOTE) Fact Sheet for Patients: bloggercourse.com  Fact Sheet for Healthcare Providers: seriousbroker.it  This test is not yet approved or cleared by the [place]  FDA and has been authorized for detection and/or diagnosis of SARS-CoV-2 by FDA under an Emergency Use Authorization (EUA). This EUA will remain in effect (meaning this test can be used) for the duration of the COVID-19 declaration under Section 564(b)(1) of the Act, 21 U.S.C. section 360bbb-3(b)(1), unless the authorization is terminated or revoked.  Performed at [facility], [address], [place], [place] [address]      Scheduled Meds:  carvedilol   12.5 mg Oral BID WC   Chlorhexidine  Gluconate Cloth  6 each Topical Daily   cloNIDine   0.2 mg Oral TID   diltiazem   120 mg Oral Daily   escitalopram   5 mg Oral Daily   ferrous sulfate   325 mg Oral Q breakfast   heparin   5,000 Units Subcutaneous Q8H   hydrALAZINE   100 mg Oral TID   insulin  aspart  0-5 Units Subcutaneous QHS   insulin  aspart  0-6 Units Subcutaneous TID WC   isosorbide  mononitrate  60 mg Oral Daily   levothyroxine   50 mcg Oral QAC breakfast   megestrol   400 mg Oral Daily   sodium chloride  flush  3 mL Intravenous Q12H   sodium chloride  flush  3-10 mL Intravenous Q12H   Continuous Infusions:    LOS: 4 days   Sherry Waller M.D on 10/16/2023 at 1:03 PM Critical care time 75 minutes-was spent seeing evaluating patient, drawn plan of care, discussing with consultants, reviewing all labs, medications  Go to [email or web address] - for contact info  [facility] - Office  [phone]  If 7PM-7AM, please contact night-coverage [email or web address] 10/16/2023, 1:03 PM

## 2023-10-16 NOTE — Progress Notes (Addendum)
 Pt's labs reviewed and 3% saline drip adjusted last night.  Sodium level is slowly and appropriately improving to 125 and with a change of 7 mEq/L change over 24 hours.  Would continue with 3% for another 3 hours and recheck Na, if it is in 125-130 range would stop 3 %  and follow.  Her urine studies do not support SIADH and her normal Serum osmolality of 290 supports isotonic hyponatremia or pseudohyponatremia.  Cortisol and TSH were WNL.  Her anemia and AKI raise the possibility of multiple myeloma which is a cause of isotonic hyponatremia.  Will order light chains and myeloma panel.  Also with AKI and hematuria and proteinuria.  Will order acute GN workup.  She is taking hydralazine  and will order anti-histone ab to r/o drug induced lupus.  Please call with any other questions or concerns.  She will be seen on Monday, but if condition worsens will see her tomorrow.

## 2023-10-16 NOTE — Plan of Care (Signed)
  Problem: Clinical Measurements: Goal: Diagnostic test results will improve Outcome: Progressing Goal: Respiratory complications will improve Outcome: Progressing   Problem: Elimination: Goal: Will not experience complications related to urinary retention Outcome: Progressing   Problem: Pain Management: Goal: General experience of comfort will improve Outcome: Progressing   Problem: Safety: Goal: Ability to remain free from injury will improve Outcome: Progressing   Problem: Skin Integrity: Goal: Risk for impaired skin integrity will decrease Outcome: Progressing

## 2023-10-17 DIAGNOSIS — J9601 Acute respiratory failure with hypoxia: Secondary | ICD-10-CM | POA: Diagnosis not present

## 2023-10-17 LAB — COMPREHENSIVE METABOLIC PANEL
ALT: 11 U/L (ref 0–44)
AST: 10 U/L — ABNORMAL LOW (ref 15–41)
Albumin: 2.5 g/dL — ABNORMAL LOW (ref 3.5–5.0)
Alkaline Phosphatase: 88 U/L (ref 38–126)
Anion gap: 9 (ref 5–15)
BUN: 63 mg/dL — ABNORMAL HIGH (ref 8–23)
CO2: 26 mmol/L (ref 22–32)
Calcium: 8.8 mg/dL — ABNORMAL LOW (ref 8.9–10.3)
Chloride: 92 mmol/L — ABNORMAL LOW (ref 98–111)
Creatinine, Ser: 1.87 mg/dL — ABNORMAL HIGH (ref 0.44–1.00)
GFR, Estimated: 26 mL/min — ABNORMAL LOW (ref >60)
Glucose, Bld: 125 mg/dL — ABNORMAL HIGH (ref 70–99)
Potassium: 4.6 mmol/L (ref 3.5–5.1)
Sodium: 127 mmol/L — ABNORMAL LOW (ref 135–145)
Total Bilirubin: 0.5 mg/dL (ref 0.0–1.2)
Total Protein: 5.6 g/dL — ABNORMAL LOW (ref 6.5–8.1)

## 2023-10-17 LAB — GLUCOSE, CAPILLARY
Glucose-Capillary: 108 mg/dL — ABNORMAL HIGH (ref 70–99)
Glucose-Capillary: 104 mg/dL — ABNORMAL HIGH (ref 70–99)
Glucose-Capillary: 122 mg/dL — ABNORMAL HIGH (ref 70–99)
Glucose-Capillary: 96 mg/dL (ref 70–99)

## 2023-10-17 LAB — C4 COMPLEMENT: Complement C4, Body Fluid: 24 mg/dL (ref 12–38)

## 2023-10-17 LAB — C3 COMPLEMENT: C3 Complement: 133 mg/dL (ref 82–167)

## 2023-10-17 MED ORDER — CLONIDINE HCL 0.2 MG PO TABS
0.3000 mg | ORAL_TABLET | Freq: Three times a day (TID) | ORAL | Status: DC
  Administered 2023-10-17 – 2023-10-21 (×13): 0.3 mg via ORAL
  Filled 2023-10-17 (×14): qty 1

## 2023-10-17 NOTE — Progress Notes (Signed)
 PROGRESS NOTE   Sherry Waller, is a 88 y.o. female, DOB - 1936/06/27, FMW:[id number]  Admit date - 10/11/2023   Admitting Physician Courage Pearlean, MD  Outpatient Primary MD for the patient is [name], MD  LOS - 5  Chief Complaint  Patient presents with   Shortness of Breath      Brief Narrative:   Sherry Waller is  88 y.o. female with past medical history significant of hypothyroidism, depression, malignant HTN, DM2, CKD 3B, and  GERD admitted on 10/11/2023 with AKI on CKD and uncontrolled hypertension     Subjective: The patient was seen and examined this morning, comfortable laying in bed, complaining of generalized weaknesses.     -Assessment and Plan:  1)AKI----acute kidney injury on CKD stage -3B with an anion gap metabolic acidosis--- -?? AiN, ATN--  --in the setting of loose stools and poor oral intake --Creatinine is up to 2.23 >> 2.50,  -creatinine was 1.1 on 10/01/2023 Lab Results  Component Value Date   CREATININE 1.87 (H) 10/17/2023   CREATININE 2.46 (H) 10/16/2023   CREATININE 2.50 (H) 10/15/2023   -BUN is up to 67 >>69 from 17 on 10/01/2023 -Bicarb is 18 >16>20>>24, 26  -on 10/12/23-- Nephrologist Dr. Marlee recommended iV fluid with bicarb in dextrose  until oral intake is more reliable 10/14/23 -- on 10/14/23 Nephrologist Dr. Peebles--recommends stopping IV fluids and starting IV Lasix -- renally adjust medications, avoid nephrotoxic agents / dehydration  / hypotension 10/15/23  -Still having poor p.o. intake -Serum sodium dropped to 118, was started on 3% normal saline per nephrologist  10/16/2023 -Remained on 3% normal saline overnight, -Serum sodium continues to improve-118>>> 126 this morning Per nephrology recommendation once between 125-130 with stop 3% normal saline -Nephrologist concern for possibility of multiple myeloma which could cause isotonic hyponatremia Workup in progress   10/17/2023 -Patient was seen and examined, awake alert,  lethargic -Off hypertonic saline -Sodium has improved 127 this a.m. -Complain of generalized weaknesses -Blood pressure remained stable, systolically elevated, diastolically soft -BP medication continue to be adjusted -appreciate nephrology recommendation    Intake/Output Summary (Last 24 hours) at 10/17/2023 1430 Last data filed at 10/17/2023 1027 Gross per 24 hour  Intake 10 ml  Output 1850 ml  Net -1840 ml       2)FUO----patient with fevers and chills since 10/11/2023 -fever and chills developed somewhat productive cough with greenish-yellowish sputum -WBCs 9.7  down from 16.3 on 10/01/2023  --Chest x-ray without acute cardiopulmonary findings -UA is not suggestive of UTI---completed antibiotics for E. coli UTI recently -COVID, RSV and flu negative --No further fevers   3)-D-dimer elevated at 6.59 with acute hypoxic respiratory failure she was found to have O2 sats of 87% required 2 L of oxygen via nasal cannula -VQ scan low probably for PE -ABG without hypercapnia ,  -- hypoxia noted on ABG with PaO2 of 61,  -COVID, RSV and flu testing requested --BNP 1005 no prior---- -troponin is down to 57 was 210 on 10/01/2023, EKG sinus rhythm -- Continue supplemental oxygen and as needed bronchodilators   4)HypoNatremia----serum sodium was as  low as 118, improved with 3% hypertonic saline  Per nephrologist to stop the 3% normal saline once serum sodium is 125-130 Workup not consistent with SIADH, serum osmolality at 290, normal TSH, cortisol level, -Continue to improve, sodium 127  5)Acute Anemia-----Hgb is down to 8.6 baseline usually between 13 and 14, -Patient denies any blood in the stool, no emesis - stool for occult blood is  neg -B12 is low at 136, replace, - folate WNL -Serum iron and iron saturation are low--replace -Anemia labs/workup as above -Concern for multiple myeloma- light chains and myeloma panel has been ordered   6)Malignant HTN--- patient has history of  stage II hypertension with difficult to control BP from time to time -Systolic blood pressure was around 200 mmhg in the ED suspect some component of rebound hypertension in the setting of clonidine  use -Required nicardipine  drip during the recent hospitalization -Continue Coreg , clonidine ,  isosorbide /hydralazine  combo -Reduce Cardizem  CD to 120 mg daily from 240 mg due to bradycardia -Continue to titrate BP meds -As needed hydralazine /labetalol    7)History of Depression/Neuropathy -Continue treatment with Lexapro  and Neurontin    8)Hypothyroidism----Continue Synthroid    9)DM2-A1c 6.6 reflecting good diabetic control PTA -Use Novolog /Humalog Sliding scale insulin  with Accu-Cheks/Fingersticks as ordered  10)Hyperkalemia--- due to #1 above -Resolved with Lokelma   Status is: Inpatient   Disposition: The patient is from: Home              Anticipated d/c is to: Home              Anticipated d/c date is: 3 days              Patient currently is not medically stable to d/c. Barriers: Not Clinically Stable-   Code Status :  -  Code Status: Full Code   Family Communication:   (patient is alert, awake and coherent)  Discussed with pts sons Sherry Waller and Sherry Waller  DVT Prophylaxis  :   - SCDs  heparin  injection 5,000 Units Start: 10/11/23 2200 SCDs Start: 10/11/23 1450 Place TED hose Start: 10/11/23 1450   Lab Results  Component Value Date   PLT 288 10/15/2023   Inpatient Medications  Scheduled Meds:  carvedilol   12.5 mg Oral BID WC   Chlorhexidine  Gluconate Cloth  6 each Topical Daily   cloNIDine   0.3 mg Oral TID   diltiazem   120 mg Oral Daily   escitalopram   5 mg Oral Daily   ferrous sulfate   325 mg Oral Q breakfast   heparin   5,000 Units Subcutaneous Q8H   hydrALAZINE   100 mg Oral TID   insulin  aspart  0-5 Units Subcutaneous QHS   insulin  aspart  0-6 Units Subcutaneous TID WC   isosorbide  mononitrate  60 mg Oral Daily   levothyroxine   50 mcg Oral QAC breakfast   megestrol    400 mg Oral Daily   sodium chloride  flush  3-10 mL Intravenous Q12H   Continuous Infusions:   PRN Meds:.acetaminophen , albuterol , bisacodyl , hydrALAZINE , ondansetron  **OR** ondansetron  (ZOFRAN ) IV, oxyCODONE , polyethylene glycol, sodium chloride  flush, traZODone    Anti-infectives (From admission, onward)    None        Objective: Vitals:   10/17/23 1100 10/17/23 1116 10/17/23 1136 10/17/23 1200  BP:  (!) 151/38  (!) 166/59  Pulse: (!) 50 (!) 49  (!) 54  Resp: 13 16  17   Temp:   98.3 F (36.8 C)   TempSrc:   Oral   SpO2: 96% 94%  95%  Weight:      Height:        Intake/Output Summary (Last 24 hours) at 10/17/2023 1430 Last data filed at 10/17/2023 1027 Gross per 24 hour  Intake 10 ml  Output 1850 ml  Net -1840 ml   Filed Weights   10/15/23 0319 10/15/23 1557 10/17/23 0500  Weight: 76.2 kg 84.7 kg 86.4 kg   Physical Exam  General:  AAO x 3,  cooperative, no distress;   HEENT:  Normocephalic, PERRL, otherwise with in Normal limits   Neuro:  CNII-XII intact. , normal motor and sensation, reflexes intact   Lungs:   Clear to auscultation BL, Respirations unlabored,  No wheezes / crackles  Cardio:    S1/S2, RRR, No murmure, No Rubs or Gallops   Abdomen:  Soft, non-tender, bowel sounds active all four quadrants, no guarding or peritoneal signs.  Muscular  skeletal:  Limited exam -global generalized weaknesses - in bed, able to move all 4 extremities,   2+ pulses,  symmetric, No pitting edema  Skin:  Dry, warm to touch, negative for any Rashes,  Wounds: Please see nursing documentation          GU-Foley catheter placed on 10/14/2023 by nephrology order-due to urinary retention and to keep accurate I's and O's  Data Reviewed: I have personally reviewed following labs and imaging studies  CBC: Recent Labs  Lab 10/11/23 0723 10/13/23 0410 10/15/23 0335  WBC 10.0 11.2* 9.7  NEUTROABS 8.0*  --   --   HGB 9.9* 8.6* 8.2*  HCT 29.8* 27.0* 25.4*  MCV  89.5 90.3 88.8  PLT 268 266 288   Basic Metabolic Panel: Recent Labs  Lab 10/11/23 0640 10/12/23 0412 10/13/23 0410 10/14/23 0417 10/15/23 0335 10/15/23 1354 10/15/23 1645 10/16/23 0010 10/16/23 0350 10/16/23 0720 10/16/23 1211 10/17/23 0619  NA 126*   < > 125* 123* 121* 118*   < > 123* 123* 125* 126* 127*  K 4.9   < > 4.9 4.3 4.6 4.6  --   --  4.5  --   --  4.6  CL 98   < > 94* 88* 86* 84*  --   --  90*  --   --  92*  CO2 18*   < > 20* 24 26 26   --   --  24  --   --  26  GLUCOSE 175*   < > 143* 162* 119* 196*  --   --  199*  --   --  125*  BUN 67*   < > 64* 63* 65* 69*  --   --  66*  --   --  63*  CREATININE 2.23*   < > 2.40* 2.28* 2.31* 2.50*  --   --  2.46*  --   --  1.87*  CALCIUM 8.8*   < > 8.6* 8.4* 8.2* 8.0*  --   --  8.4*  --   --  8.8*  MG 2.5*  --   --   --   --   --   --   --   --   --   --   --   PHOS  --   --  3.5  --  4.3  --   --   --   --   --   --   --    < > = values in this interval not displayed.   GFR: Estimated Creatinine Clearance: 23.9 mL/min (A) (by C-G formula based on SCr of 1.87 mg/dL (H)). Liver Function Tests: Recent Labs  Lab 10/11/23 0640 10/13/23 0410 10/15/23 0335 10/17/23 0619  AST 36  --  12* 10*  ALT 25  --  13 11  ALKPHOS 126  --  92 88  BILITOT 0.8  --  0.4 0.5  PROT 6.5  --  5.5* 5.6*  ALBUMIN  2.9* 2.5* 2.3*  2.2*  2.5*   Cardiac Enzymes: Recent Labs  Lab 10/12/23 0412  CKTOTAL 405*   Recent Results (from the past 240 hours)  Resp panel by RT-PCR (RSV, Flu A&B, Covid)     Status: None   Collection Time: 10/11/23  4:15 PM  Result Value Ref Range Status   SARS Coronavirus 2 by RT PCR NEGATIVE NEGATIVE Final    Comment: (NOTE) SARS-CoV-2 target nucleic acids are NOT DETECTED.  The SARS-CoV-2 RNA is generally detectable in upper respiratory specimens during the acute phase of infection. The lowest concentration of SARS-CoV-2 viral copies this assay can detect is 138 copies/mL. A negative result does not preclude  SARS-Cov-2 infection and should not be used as the sole basis for treatment or other patient management decisions. A negative result may occur with  improper specimen collection/handling, submission of specimen other than nasopharyngeal swab, presence of viral mutation(s) within the areas targeted by this assay, and inadequate number of viral copies(<138 copies/mL). A negative result must be combined with clinical observations, patient history, and epidemiological information. The expected result is Negative.  Fact Sheet for Patients:  bloggercourse.com  Fact Sheet for Healthcare Providers:  seriousbroker.it  This test is no t yet approved or cleared by the [place]  FDA and  has been authorized for detection and/or diagnosis of SARS-CoV-2 by FDA under an Emergency Use Authorization (EUA). This EUA will remain  in effect (meaning this test can be used) for the duration of the COVID-19 declaration under Section 564(b)(1) of the Act, 21 U.S.C.section 360bbb-3(b)(1), unless the authorization is terminated  or revoked sooner.       Influenza A by PCR NEGATIVE NEGATIVE Final   Influenza B by PCR NEGATIVE NEGATIVE Final    Comment: (NOTE) The Xpert Xpress SARS-CoV-2/FLU/RSV plus assay is intended as an aid in the diagnosis of influenza from Nasopharyngeal swab specimens and should not be used as a sole basis for treatment. Nasal washings and aspirates are unacceptable for Xpert Xpress SARS-CoV-2/FLU/RSV testing.  Fact Sheet for Patients: bloggercourse.com  Fact Sheet for Healthcare Providers: seriousbroker.it  This test is not yet approved or cleared by the [place]  FDA and has been authorized for detection and/or diagnosis of SARS-CoV-2 by FDA under an Emergency Use Authorization (EUA). This EUA will remain in effect (meaning this test can be used) for the duration of  the COVID-19 declaration under Section 564(b)(1) of the Act, 21 U.S.C. section 360bbb-3(b)(1), unless the authorization is terminated or revoked.     Resp Syncytial Virus by PCR NEGATIVE NEGATIVE Final    Comment: (NOTE) Fact Sheet for Patients: bloggercourse.com  Fact Sheet for Healthcare Providers: seriousbroker.it  This test is not yet approved or cleared by the [place]  FDA and has been authorized for detection and/or diagnosis of SARS-CoV-2 by FDA under an Emergency Use Authorization (EUA). This EUA will remain in effect (meaning this test can be used) for the duration of the COVID-19 declaration under Section 564(b)(1) of the Act, 21 U.S.C. section 360bbb-3(b)(1), unless the authorization is terminated or revoked.  Performed at [facility], [address], [place], [place] [address]      Scheduled Meds:  carvedilol   12.5 mg Oral BID WC   Chlorhexidine  Gluconate Cloth  6 each Topical Daily   cloNIDine   0.3 mg Oral TID   diltiazem   120 mg Oral Daily   escitalopram   5 mg Oral Daily   ferrous sulfate   325 mg Oral Q breakfast   heparin   5,000 Units Subcutaneous Q8H  hydrALAZINE   100 mg Oral TID   insulin  aspart  0-5 Units Subcutaneous QHS   insulin  aspart  0-6 Units Subcutaneous TID WC   isosorbide  mononitrate  60 mg Oral Daily   levothyroxine   50 mcg Oral QAC breakfast   megestrol   400 mg Oral Daily   sodium chloride  flush  3-10 mL Intravenous Q12H   Continuous Infusions:    LOS: 5 days   Adriana DELENA Grams M.D on 10/17/2023 at 2:30 PM Critical care time 55 minutes-was spent seeing evaluating patient, drawn plan of care, discussing with consultants, reviewing all labs, medications  Go to [email or web address] - for contact info  [facility] - Office  [phone]  If 7PM-7AM, please contact night-coverage [email or web address] 10/17/2023, 2:30 PM

## 2023-10-17 NOTE — Plan of Care (Signed)
  Problem: Clinical Measurements: Goal: Will remain free from infection Outcome: Progressing Goal: Respiratory complications will improve Outcome: Progressing   Problem: Nutrition: Goal: Adequate nutrition will be maintained Outcome: Progressing   Problem: Safety: Goal: Ability to remain free from injury will improve Outcome: Progressing

## 2023-10-17 NOTE — Plan of Care (Signed)
  Problem: Health Behavior/Discharge Planning: Goal: Ability to manage health-related needs will improve Outcome: Progressing   Problem: Clinical Measurements: Goal: Ability to maintain clinical measurements within normal limits will improve Outcome: Progressing Goal: Will remain free from infection Outcome: Progressing Goal: Diagnostic test results will improve Outcome: Progressing Goal: Respiratory complications will improve Outcome: Progressing   Problem: Activity: Goal: Risk for activity intolerance will decrease Outcome: Progressing   Problem: Elimination: Goal: Will not experience complications related to bowel motility Outcome: Progressing Goal: Will not experience complications related to urinary retention Outcome: Progressing   Problem: Pain Management: Goal: General experience of comfort will improve Outcome: Progressing   Problem: Safety: Goal: Ability to remain free from injury will improve Outcome: Progressing   Problem: Skin Integrity: Goal: Risk for impaired skin integrity will decrease Outcome: Progressing   Problem: Fluid Volume: Goal: Ability to maintain a balanced intake and output will improve Outcome: Progressing   Problem: Metabolic: Goal: Ability to maintain appropriate glucose levels will improve Outcome: Progressing   Problem: Skin Integrity: Goal: Risk for impaired skin integrity will decrease Outcome: Progressing

## 2023-10-18 DIAGNOSIS — Z66 Do not resuscitate: Secondary | ICD-10-CM

## 2023-10-18 DIAGNOSIS — I10 Essential (primary) hypertension: Secondary | ICD-10-CM

## 2023-10-18 DIAGNOSIS — R001 Bradycardia, unspecified: Secondary | ICD-10-CM | POA: Diagnosis not present

## 2023-10-18 DIAGNOSIS — Z7189 Other specified counseling: Secondary | ICD-10-CM

## 2023-10-18 DIAGNOSIS — Z515 Encounter for palliative care: Secondary | ICD-10-CM

## 2023-10-18 DIAGNOSIS — J9601 Acute respiratory failure with hypoxia: Secondary | ICD-10-CM | POA: Diagnosis not present

## 2023-10-18 LAB — GLUCOSE, CAPILLARY
Glucose-Capillary: 107 mg/dL — ABNORMAL HIGH (ref 70–99)
Glucose-Capillary: 144 mg/dL — ABNORMAL HIGH (ref 70–99)
Glucose-Capillary: 115 mg/dL — ABNORMAL HIGH (ref 70–99)
Glucose-Capillary: 117 mg/dL — ABNORMAL HIGH (ref 70–99)

## 2023-10-18 LAB — COMPREHENSIVE METABOLIC PANEL
ALT: 11 U/L (ref 0–44)
AST: 11 U/L — ABNORMAL LOW (ref 15–41)
Albumin: 2.5 g/dL — ABNORMAL LOW (ref 3.5–5.0)
Alkaline Phosphatase: 85 U/L (ref 38–126)
Anion gap: 8 (ref 5–15)
BUN: 59 mg/dL — ABNORMAL HIGH (ref 8–23)
CO2: 26 mmol/L (ref 22–32)
Calcium: 8.8 mg/dL — ABNORMAL LOW (ref 8.9–10.3)
Chloride: 95 mmol/L — ABNORMAL LOW (ref 98–111)
Creatinine, Ser: 1.81 mg/dL — ABNORMAL HIGH (ref 0.44–1.00)
GFR, Estimated: 27 mL/min — ABNORMAL LOW (ref >60)
Glucose, Bld: 125 mg/dL — ABNORMAL HIGH (ref 70–99)
Potassium: 5 mmol/L (ref 3.5–5.1)
Sodium: 129 mmol/L — ABNORMAL LOW (ref 135–145)
Total Bilirubin: 0.6 mg/dL (ref 0.0–1.2)
Total Protein: 5.6 g/dL — ABNORMAL LOW (ref 6.5–8.1)

## 2023-10-18 LAB — GLOMERULAR BASEMENT MEMBRANE ANTIBODIES: GBM Ab: <0.2 U (ref 0.0–0.9)

## 2023-10-18 LAB — KAPPA/LAMBDA LIGHT CHAINS
Kappa free light chain: 53.2 mg/L — ABNORMAL HIGH (ref 3.3–19.4)
Kappa, lambda light chain ratio: 1.18 (ref 0.26–1.65)
Lambda free light chains: 45.1 mg/L — ABNORMAL HIGH (ref 5.7–26.3)

## 2023-10-18 MED ORDER — AMLODIPINE BESYLATE 5 MG PO TABS
10.0000 mg | ORAL_TABLET | Freq: Every day | ORAL | Status: DC
  Administered 2023-10-19 – 2023-10-23 (×5): 10 mg via ORAL
  Filled 2023-10-18 (×5): qty 2

## 2023-10-18 MED ORDER — CARVEDILOL 3.125 MG PO TABS
3.1250 mg | ORAL_TABLET | Freq: Two times a day (BID) | ORAL | Status: DC
  Administered 2023-10-19 – 2023-10-23 (×9): 3.125 mg via ORAL
  Filled 2023-10-18 (×9): qty 1

## 2023-10-18 MED ORDER — GABAPENTIN 100 MG PO CAPS
100.0000 mg | ORAL_CAPSULE | Freq: Two times a day (BID) | ORAL | Status: DC
  Administered 2023-10-18 – 2023-10-23 (×11): 100 mg via ORAL
  Filled 2023-10-18 (×11): qty 1

## 2023-10-18 MED ORDER — ORAL CARE MOUTH RINSE
15.0000 mL | OROMUCOSAL | Status: DC | PRN
Start: 2023-10-18 — End: 2023-10-23

## 2023-10-18 MED ORDER — CARVEDILOL 12.5 MG PO TABS: 12.5000 mg | ORAL_TABLET | Freq: Two times a day (BID) | ORAL | Status: DC

## 2023-10-18 NOTE — Consult Note (Signed)
 Palliative Care Consult Note                                  Date: 10/18/2023   Patient Name: Sherry Waller  DOB: 08-Jul-1936  MRN: [id number]  Age / Sex: 88 y.o., female  PCP: [name], MD Referring Physician: Willette Adriana LABOR, MD  Reason for Consultation: Establishing goals of care  HPI/Patient Profile: 88 y.o. female  with past medical history of hypothyroidism, depression, malignant HTN, DM2, CKD 3B, and GERD admitted on 10/11/2023 with AKI on CKD and uncontrolled hypertension.  She was admitted on 10/11/2023 with AKI on CKD 3B, fever of unknown origin, hyponatremia, acute anemia, malignant hypertension, concern for multiple myeloma, and others.   Palliative medicine was consulted for GOC conversations.  Past Medical History:  Diagnosis Date   Hypertension    Thyroid  disease     Subjective:   This NP Sherry Waller reviewed medical records, received report from team, assessed the patient and then meet at the patient's bedside to discuss diagnosis, prognosis, GOC, EOL wishes disposition and options.  I met with the patient at the bedside.  Also present was her son Sherry Waller.   We meet to discuss diagnosis prognosis, GOC, EOL wishes, disposition and options. Concept of Palliative Care was introduced as specialized medical care for people and their families living with serious illness.  If focuses on providing relief from the symptoms and stress of a serious illness.  The goal is to improve quality of life for both the patient and the family. Values and goals of care important to patient and family were attempted to be elicited.  Created space and opportunity for patient  and family to explore thoughts and feelings regarding current medical situation   Natural trajectory and current clinical status were discussed. Questions and concerns addressed. Patient  encouraged to call with questions or concerns.    Patient/Family Understanding of  Illness: But the patient and family understand that she has chronic kidney problems, worsening kidney problem during this admission, difficult to control high blood pressure, and neuropathy.  Of particular concern today to the patient is her neuropathy as she is frequently complaining of foot pain.  Chart review and reassurance provided that hospitalist is addressing pain with medication changes.  Life Review: Patient is married but her husband has advanced dementia and has Sherry Waller in long-term care/memory care for about 1-1/2 years.  She has 3 sons Sherry Waller (who lives in [place], [place] ), Sherry Waller (who lives in [place], [place] ), and Sherry Waller (who lives in [place], [place] ).  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  Her son shares that she often bribed that she was the only person that Sherry Waller (of [place]) would allow to stick her for blood work.  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  We celebrated her long and distinguished career in serve acute of others in the healthcare environment.  Goals: To get better, to leave the hospital, to get stronger (possibly through rehab), and get home.  Today's Discussion: In addition to discussion described above he had substantial discussion on various topics.  The patient is a bit lethargic today, although she is oriented and able to answer questions.  She spends most of the time during my visit with her eyes closed, although she does  answer when asked questions.  We reviewed her medical history and acute medical problems and updated on new information today.  We discussed multiple chronic comorbidities and  how these can affect her ability to recover from acute health problems.  We shared that she is still undergoing significant workup with multiple labs pending to help answer some questions about her overall health.  She understands that it  is very possible that her health medically in the future.  We had a good discussion on CODE STATUS.  The patient was very clear that she would not want resuscitation or intubation and elects to be DNR/DNI.  Her son shares that she has Sherry Waller very clear about this to her family as well.  I shared that I would change her status from full code to DNR-limited to reflect her wishes.  Her son seemed to have a little difficulty hearing this, although I provided reassurance that she is very clear on this and it is simply following her wishes.  Finally, we discussed the need for time for some outcomes to see how she does in the coming thereto, get test results back.  At this point we can have further discussions about other decisions about how to best care for her moving forward.  I did note that PT is recommending possibly going to skilled nursing facility/rehab.  I asked her if this would be acceptable to her and she stated that would, in order to help her get stronger and be able to get home.  I shared that I would follow-up in a couple days so that we could have further discussions pending return of other laboratory data and information. I provided emotional and general support through therapeutic listening, empathy, sharing of stories, therapeutic touch, and other techniques. I answered all questions and addressed all concerns to the best of my ability.  Review of Systems  Constitutional:  Positive for fatigue.  Respiratory:  Negative for shortness of breath.   Gastrointestinal:  Negative for abdominal pain, nausea and vomiting.  Neurological:  Positive for weakness.       Consistently notes ankle pain which on description seems neuropathic in nature    Objective:   Primary Diagnoses: Present on Admission:  Acute respiratory failure with hypoxia (HCC)  HTN (hypertension), malignant  Hypothyroidism  CKD (chronic kidney disease) stage 3B-  AKI (acute kidney injury) (HCC)   Physical Exam Vitals  and nursing note reviewed.  Constitutional:      General: She is sleeping. She is not in acute distress.    Appearance: She is ill-appearing.  HENT:     Head: Normocephalic and atraumatic.  Cardiovascular:     Rate and Rhythm: Bradycardia present.  Pulmonary:     Effort: Pulmonary effort is normal. No respiratory distress.     Breath sounds: No wheezing or rhonchi.  Abdominal:     General: Abdomen is flat.     Palpations: Abdomen is soft.  Musculoskeletal:     Comments: Frequently moving her left leg with complaints of ankle pain  Skin:    General: Skin is warm and dry.  Neurological:     General: No focal deficit present.     Mental Status: She is easily aroused.  Psychiatric:        Mood and Affect: Mood normal.        Behavior: Behavior normal.     Vital Signs:  BP (!) 130/31   Pulse (!) 44   Temp 97.7 F (36.5 C) (Oral)  Resp 12   Ht 5' 5 (1.651 m)   Wt 75.7 kg   SpO2 97%   BMI 27.77 kg/m   Palliative Assessment/Data: 30-40%    Advanced Care Planning:   Existing Vynca/ACP Documentation: None  Primary Decision Maker: PATIENT  Code Status/Advance Care Planning: Full code  A discussion was had today regarding advanced directives. Concepts specific to code status, artifical feeding and hydration, continued IV antibiotics and rehospitalization was had.  The difference between a aggressive medical intervention path and a palliative comfort care path for this patient at this time was had.   Decisions/Changes to ACP: Changed to DNR-limited  Assessment & Plan:   Impression: 88 year old female with chronic comorbidities and acute presentation as described above.  The patient seems quite frail and weak today, a bit lethargic.  However, she is oriented and interacting in conversation.  She has elected DNR/DNI, family understands.  We discussed her chronic comorbidities and acute presentations.  We shared that there is still significant workup that is pending  including multiple lab tests.  We discussed the need for ongoing GOC conversations when we get further data back and have a better picture about what is going on.  Overall long-term prognosis guarded to poor  SUMMARY OF RECOMMENDATIONS   Changed to DNR-limited Continue full scope of care otherwise Await further workup and outcomes Palliative medicine will follow-up in a couple days for further GOC conversations Please notify us  of any significant clinical change or new palliative needs in the interim  Symptom Management:  Per primary team PMT is available to assist as needed  Prognosis:  Unable to determine  Discharge Planning:  To Be Determined   Discussed with: Patient, family, medical team, nursing team    Thank you for allowing us  to participate in the care of Sherry Waller PMT will continue to support holistically.  Time Total: 60 min  Detailed review of medical records (labs, imaging, vital signs), medically appropriate exam, discussed with treatment team, counseling and education to patient, family, & staff, documenting clinical information, medication management, coordination of care  Signed by: Sherry Kays, NP Palliative Medicine Team  Team Phone # [phone] (Nights/Weekends)  10/18/2023, 3:38 PM

## 2023-10-18 NOTE — Progress Notes (Signed)
 Patient ID: Sherry Waller, female   DOB: 15-Dec-1935, 88 y.o.   MRN: [id number] S: Pt reports that she feels terrible this morning.  Has left ankle pain and some SOB.  Family at bedside.  Reports neuropathy and was given pain medication yesterday which helped.  Normally takes gabapentin  at home but held due to AKI and hyponatremia. O:BP (!) 160/32   Pulse (!) 55   Temp 98.1 F (36.7 C) (Oral)   Resp 13   Ht 5' 5 (1.651 m)   Wt 75.7 kg   SpO2 94%   BMI 27.77 kg/m   Intake/Output Summary (Last 24 hours) at 10/18/2023 0929 Last data filed at 10/18/2023 9077 Gross per 24 hour  Intake 240 ml  Output 1250 ml  Net -1010 ml   Intake/Output: I/O last 3 completed shifts: In: 10 [I.V.:10] Out: 2250 [Urine:2250]  Intake/Output this shift:  Total I/O In: 240 [P.O.:240] Out: -  Weight change: -10.7 kg Gen: frail, chronically ill-appearing female in mild distress CVS: Bradycardic at 27 Resp: CTA anterioroly Abd: +BS, soft, NT/ND Ext: no lower extremity edema, 1+ LUE edema  Recent Labs  Lab 10/13/23 0410 10/14/23 0417 10/15/23 0335 10/15/23 1354 10/15/23 1645 10/15/23 2012 10/16/23 0010 10/16/23 0350 10/16/23 0720 10/16/23 1211 10/17/23 0619 10/18/23 0404  NA 125* 123* 121* 118*   < > 122* 123* 123* 125* 126* 127* 129*  K 4.9 4.3 4.6 4.6  --   --   --  4.5  --   --  4.6 5.0  CL 94* 88* 86* 84*  --   --   --  90*  --   --  92* 95*  CO2 20* 24 26 26   --   --   --  24  --   --  26 26  GLUCOSE 143* 162* 119* 196*  --   --   --  199*  --   --  125* 125*  BUN 64* 63* 65* 69*  --   --   --  66*  --   --  63* 59*  CREATININE 2.40* 2.28* 2.31* 2.50*  --   --   --  2.46*  --   --  1.87* 1.81*  ALBUMIN  2.5*  --  2.3*  2.2*  --   --   --   --   --   --   --  2.5* 2.5*  CALCIUM 8.6* 8.4* 8.2* 8.0*  --   --   --  8.4*  --   --  8.8* 8.8*  PHOS 3.5  --  4.3  --   --   --   --   --   --   --   --   --   AST  --   --  12*  --   --   --   --   --   --   --  10* 11*  ALT  --   --  13  --   --    --   --   --   --   --  11 11   < > = values in this interval not displayed.   Liver Function Tests: Recent Labs  Lab 10/15/23 0335 10/17/23 0619 10/18/23 0404  AST 12* 10* 11*  ALT 13 11 11   ALKPHOS 92 88 85  BILITOT 0.4 0.5 0.6  PROT 5.5* 5.6* 5.6*  ALBUMIN  2.3*  2.2* 2.5* 2.5*   No results for  input(s): LIPASE, AMYLASE in the last 168 hours. No results for input(s): AMMONIA in the last 168 hours. CBC: Recent Labs  Lab 10/13/23 0410 10/15/23 0335  WBC 11.2* 9.7  HGB 8.6* 8.2*  HCT 27.0* 25.4*  MCV 90.3 88.8  PLT 266 288   Cardiac Enzymes: Recent Labs  Lab 10/12/23 0412  CKTOTAL 405*   CBG: Recent Labs  Lab 10/17/23 0755 10/17/23 1118 10/17/23 1553 10/17/23 2043 10/18/23 0746  GLUCAP 108* 104* 122* 96 107*    Iron Studies: No results for input(s): IRON, TIBC, TRANSFERRIN, FERRITIN in the last 72 hours. Studies/Results: No results found.  [START ON 10/19/2023] carvedilol   12.5 mg Oral BID WC   Chlorhexidine  Gluconate Cloth  6 each Topical Daily   cloNIDine   0.3 mg Oral TID   diltiazem   120 mg Oral Daily   escitalopram   5 mg Oral Daily   ferrous sulfate   325 mg Oral Q breakfast   heparin   5,000 Units Subcutaneous Q8H   hydrALAZINE   100 mg Oral TID   insulin  aspart  0-5 Units Subcutaneous QHS   insulin  aspart  0-6 Units Subcutaneous TID WC   isosorbide  mononitrate  60 mg Oral Daily   levothyroxine   50 mcg Oral QAC breakfast   megestrol   400 mg Oral Daily   sodium chloride  flush  3-10 mL Intravenous Q12H    BMET    Component Value Date/Time   NA 129 (L) 10/18/2023 0404   K 5.0 10/18/2023 0404   CL 95 (L) 10/18/2023 0404   CO2 26 10/18/2023 0404   GLUCOSE 125 (H) 10/18/2023 0404   BUN 59 (H) 10/18/2023 0404   CREATININE 1.81 (H) 10/18/2023 0404   CALCIUM 8.8 (L) 10/18/2023 0404   GFRNONAA 27 (L) 10/18/2023 0404   GFRAA 51 (L) 05/23/2018 0823   CBC    Component Value Date/Time   WBC 9.7 10/15/2023 0335   RBC 2.86 (L)  10/15/2023 0335   HGB 8.2 (L) 10/15/2023 0335   HCT 25.4 (L) 10/15/2023 0335   PLT 288 10/15/2023 0335   MCV 88.8 10/15/2023 0335   MCH 28.7 10/15/2023 0335   MCHC 32.3 10/15/2023 0335   RDW 13.5 10/15/2023 0335   LYMPHSABS 0.7 10/11/2023 0723   MONOABS 0.8 10/11/2023 0723   EOSABS 0.3 10/11/2023 0723   BASOSABS 0.1 10/11/2023 0723     Assessment/Plan:  AKI/CKD Stage III - UOP markedly improved following 3% saline.  Likely in recovery phase of ATN.  Given microscopic hematuria and proteinuria, GN workup started over the weekend and are pending.  C3 and C4 WNL.  BUN/Cr continue to improve.  No indication for dialysis at this time.  Hyponatremia - initially felt to be hypovolemic with UNa of 13, Uosm 236, Sosm 290 on 10/15/23.  She was given IVF's with improvement but then developed edema and given IV lasix  which then dropped Na to 118.  She was transferred to ICU and started on 3% saline on 10/15/23 with steady improvement of her Sodium levels and was stopped on 10/16/23.  Sodium level continues to improve.  Given normal serum osmolality, isotonic hyponatremia started.  Normal lipid panel, TSH and cortisol.  Myeloma panel in process. Anemia - new and concerning for myeloma as above.   Systolic HTN with Widened pulse pressure - likely due to arteriosclerosis and beta-blockers.  ECHO without aortic valve disease.  Cardiology to be consulted per primary svc.  Acute hypoxic respiratory failure - V/Q scan low prob.  CXR with new air  space disease.  Respiratory panel negative.  Fairy RONAL Sellar, MD [facility]

## 2023-10-18 NOTE — Evaluation (Signed)
 Physical Therapy Evaluation Patient Details Name: Sherry Waller MRN: [id number] DOB: 24-Aug-1936 Today's Date: 10/18/2023  History of Present Illness  88 y.o. female with past medical history significant of hypothyroidism, depression, malignant HTN, DM2, CKD 3B, and  GERD admitted on 10/11/2023 with AKI on CKD and uncontrolled hypertension.  Clinical Impression   Pt tolerated today's Physical Therapy Evaluation, well with reduced carryover for mobility due to cognition. Showing impulsive movements with increased multiplanar swaying with and without AD. Pt demonstrating poor alertness with eyes closing intermittently in standing. Pt demonstrating significant limitations in bed mobility, ADLs, transfers and ambulation due to muscle weakness, balance deficits, and reduced cognition. Based upon these deficits/impairments, patient will benefit from continued skilled physical therapy services during remainder of hospital stay and at the next recommended venue of care to address deficits and promote return to optimal function.                If plan is discharge home, recommend the following: A lot of help with walking and/or transfers;A lot of help with bathing/dressing/bathroom   Can travel by private vehicle   Yes    Equipment Recommendations    Recommendations for Other Services       Functional Status Assessment Patient has had a recent decline in their functional status and demonstrates the ability to make significant improvements in function in a reasonable and predictable amount of time.     Precautions / Restrictions Precautions Precautions: Fall Restrictions Weight Bearing Restrictions Per Provider Order: No      Mobility  Bed Mobility Overal bed mobility: Needs Assistance Bed Mobility: Supine to Sit, Sit to Supine     Supine to sit: Min assist Sit to supine: Min assist   General bed mobility comments: slow, labored movement with intermittent explosive, impulsive  movement. Consistent tactile and verbal cues given for safety bed mobiltiy. Poor carryover and weight shifts laterally when scooting EOB. Patient Response: Cooperative, Impulsive  Transfers Overall transfer level: Needs assistance Equipment used: Rolling walker (2 wheels) Transfers: Sit to/from Stand Sit to Stand: Min assist           General transfer comment: unsteady, impulsive standing from EOB to RW.    Ambulation/Gait Ambulation/Gait assistance: Min assist Gait Distance (Feet): 2 Feet Assistive device: Rolling walker (2 wheels) Gait Pattern/deviations: Step-to pattern, Decreased step length - right, Decreased step length - left, Decreased stance time - right, Decreased stance time - left, Decreased dorsiflexion - right, Decreased dorsiflexion - left       General Gait Details: Limited to lateral side steps at EOB due to poor command following, increased trunk swaying, and peripheral neuropathy.  Stairs            Wheelchair Mobility     Tilt Bed Tilt Bed Patient Response: Cooperative, Impulsive  Modified Rankin (Stroke Patients Only)       Balance Overall balance assessment: Needs assistance Sitting-balance support: No upper extremity supported Sitting balance-Leahy Scale: Poor Sitting balance - Comments: increased anterior/posterior swaying     Standing balance-Leahy Scale: Poor Standing balance comment: swaying in multiple planes                             Pertinent Vitals/Pain Pain Assessment Pain Assessment: Faces Faces Pain Scale: Hurts even more Pain Location: BLE Pain Descriptors / Indicators: Burning Pain Intervention(s): Limited activity within patient's tolerance    Home Living Family/patient expects to be discharged to:: Private residence  Living Arrangements: Other relatives Available Help at Discharge: Family (Pt's son reporting that pt's grand-daughter lives with her and was helping.) Type of Home: House Home Access:  Stairs to enter   [employer] of Steps: 4   Home Layout: One level Home Equipment: [employer] (2 wheels);BSC/3in1;Lift chair;Shower seat      Prior Function Prior Level of Function : Independent/Modified Independent;Needs assist       Physical Assist : ADLs (physical);Mobility (physical)     Mobility Comments: Pt reports that she was indepenent prior with RW. Pt's son reports that she hasn't been moving often at home. ADLs Comments: pt reports independence, but pt's son reports she has been declining for the past few weeks.     Extremity/Trunk Assessment   Upper Extremity Assessment Upper Extremity Assessment: Defer to OT evaluation    Lower Extremity Assessment Lower Extremity Assessment: RLE deficits/detail;LLE deficits/detail RLE Deficits / Details: 4-/5 knee extension MMT RLE Sensation: decreased light touch;decreased proprioception;history of peripheral neuropathy LLE Deficits / Details: 4-/5 knee extension MMT LLE Sensation: decreased light touch;decreased proprioception;history of peripheral neuropathy    Cervical / Trunk Assessment Cervical / Trunk Assessment: Kyphotic  Communication   Communication Communication: Difficulty communicating thoughts/reduced clarity of speech Cueing Techniques: Verbal cues;Tactile cues  Cognition Arousal: Alert, Lethargic   Overall Cognitive Status: Impaired/Different from baseline Area of Impairment: Orientation, Attention, Following commands                 Orientation Level: Person, Place, Time, Situation Current Attention Level: Divided   Following Commands: Follows one step commands with increased time, Follows multi-step commands inconsistently                General Comments      Exercises     Assessment/Plan    PT Assessment Patient needs continued PT services  PT Problem List Decreased strength;Decreased range of motion;Decreased activity tolerance;Decreased balance;Decreased  coordination;Decreased mobility;Decreased cognition       PT Treatment Interventions DME instruction;Gait training;Functional mobility training;Therapeutic activities;Therapeutic exercise;Balance training;Neuromuscular re-education    PT Goals (Current goals can be found in the Care Plan section)  Acute Rehab PT Goals Patient Stated Goal: establish goals of care PT Goal Formulation: With patient/family Time For Goal Achievement: 11/01/23 Potential to Achieve Goals: Good    Frequency Min 3X/week     Co-evaluation               AM-PAC PT 6 Clicks Mobility  Outcome Measure Help needed turning from your back to your side while in a flat bed without using bedrails?: A Little Help needed moving from lying on your back to sitting on the side of a flat bed without using bedrails?: A Little Help needed moving to and from a bed to a chair (including a wheelchair)?: A Little Help needed standing up from a chair using your arms (e.g., wheelchair or bedside chair)?: A Lot Help needed to walk in hospital room?: A Lot Help needed climbing 3-5 steps with a railing? : Total 6 Click Score: 14    End of Session Equipment Utilized During Treatment: Gait belt Activity Tolerance: Patient tolerated treatment well Patient left: in bed;with call bell/phone within reach;with bed alarm set;with family/visitor present Nurse Communication: Mobility status PT Visit Diagnosis: Unsteadiness on feet (R26.81);Other abnormalities of gait and mobility (R26.89);Muscle weakness (generalized) (M62.81)    Time: 8574-8543 PT Time Calculation (min) (ACUTE ONLY): 31 min   Charges:   PT Evaluation $PT Eval Moderate Complexity: 1 Mod PT Treatments $  Therapeutic Activity: 8-22 mins PT General Charges $$ ACUTE PT VISIT: 1 Visit         Omega JONETTA Donna ALMETA, DPT [facility] [place] [phone] office  Omega JONETTA Donna 10/18/2023, 3:23 PM

## 2023-10-18 NOTE — Plan of Care (Signed)
  Problem: Health Behavior/Discharge Planning: Goal: Ability to manage health-related needs will improve Outcome: Progressing   Problem: Clinical Measurements: Goal: Ability to maintain clinical measurements within normal limits will improve Outcome: Progressing Goal: Will remain free from infection Outcome: Progressing Goal: Diagnostic test results will improve Outcome: Progressing Goal: Respiratory complications will improve Outcome: Progressing   Problem: Activity: Goal: Risk for activity intolerance will decrease Outcome: Progressing   Problem: Nutrition: Goal: Adequate nutrition will be maintained Outcome: Progressing   Problem: Elimination: Goal: Will not experience complications related to bowel motility Outcome: Progressing Goal: Will not experience complications related to urinary retention Outcome: Progressing   Problem: Pain Management: Goal: General experience of comfort will improve Outcome: Progressing   Problem: Safety: Goal: Ability to remain free from injury will improve Outcome: Progressing   Problem: Skin Integrity: Goal: Risk for impaired skin integrity will decrease Outcome: Progressing   Problem: Fluid Volume: Goal: Ability to maintain a balanced intake and output will improve Outcome: Progressing   Problem: Health Behavior/Discharge Planning: Goal: Ability to identify and utilize available resources and services will improve Outcome: Progressing Goal: Ability to manage health-related needs will improve Outcome: Progressing   Problem: Metabolic: Goal: Ability to maintain appropriate glucose levels will improve Outcome: Progressing   Problem: Skin Integrity: Goal: Risk for impaired skin integrity will decrease Outcome: Progressing

## 2023-10-18 NOTE — Consult Note (Addendum)
 Cardiology Consultation   Patient ID: Sherry Waller MRN: [id number]; DOB: [birth year]  Admit date: 10/11/2023 Date of Consult: 10/18/2023  PCP:  Sherry Rush, Waller   [facility] Providers Cardiologist:  Sherry Rout, Waller (Inactive)        Patient Profile:   Sherry Waller is a 88 y.o. female with a hx of malignant HTN, DM2, CKD 3B, and GERD admitted on 10/11/2023 with AKI on CKD and uncontrolled hypertension  who is being seen 10/18/2023 for the evaluation of HTN and wide pulse pressure at the request of Dr. Willette.  History of Present Illness:   Ms. Sherry Waller has above PMHx. She was discharged 10/03/23 with hypertensive urgency, UTI, N/V, elevated troponins. Clonidine  patch increased from 0.2 to 0.3 , nifedipine,metoprolol/hydrochlorothiazide, losartan stopped. Started on coreg  3.125 bid, dilt reduced to 240 mg, lasix  20mg  added, hydralazine  and imdur .  Readmitted 10/11/23 AKI, FUO, elvated D-dimer, acute hypoxic resp faiulure.She has had poor oral intake and loose stools, Crt up 2.50, hypertensive and was hyponatremic Na 118 after receiving IV lasix  and started on 3% normal saline. She is being worked up for multiple myeloma. Sodium up 129. She also had acute hypoxic respiratory failure. BNP 1005, trop 210, 57, Hbg 8.6. diltiazem  reduced to 120 mg due to bradycardia, metoprolol reduced    Past Medical History:  Diagnosis Date   Hypertension    Thyroid  disease     Past Surgical History:  Procedure Laterality Date   ABDOMINAL HYSTERECTOMY     CHOLECYSTECTOMY     TONSILLECTOMY       Home Medications:  Prior to Admission medications   Medication Sig Start Date End Date Taking? Authorizing Provider  acetaminophen  (TYLENOL ) 500 MG tablet Take 2 tablets (1,000 mg total) by mouth every 8 (eight) hours as needed for mild pain (pain score 1-3), headache or fever. 10/03/23  Yes Sherry Waller  carvedilol  (COREG ) 3.125 MG tablet Take 1 tablet (3.125 mg total) by  mouth 2 (two) times daily. 10/03/23  Yes Sherry Waller  cloNIDine  (CATAPRES ) 0.3 MG tablet Take 0.3 mg by mouth 3 (three) times daily.   Yes Provider, Historical, Waller  diltiazem  (CARDIZEM  CD) 240 MG 24 hr capsule Take 1 capsule (240 mg total) by mouth daily. 10/04/23  Yes Sherry Waller  escitalopram  (LEXAPRO ) 10 MG tablet Take 10 mg by mouth daily. Take 1/2 a tablet by mouth once a day   Yes Provider, Historical, Waller  furosemide  (LASIX ) 20 MG tablet Take 1 tablet (20 mg total) by mouth daily. 10/03/23  Yes Sherry Waller  gabapentin  (NEURONTIN ) 100 MG capsule Take 100 mg by mouth daily.   Yes Provider, Historical, Waller  hydrALAZINE  (APRESOLINE ) 50 MG tablet Take 1 tablet (50 mg total) by mouth in the morning and at bedtime. 10/03/23 01/01/24 Yes Sherry Waller  isosorbide  mononitrate (IMDUR ) 60 MG 24 hr tablet Take 1 tablet (60 mg total) by mouth daily. 10/04/23  Yes Sherry Waller  levothyroxine  (SYNTHROID , LEVOTHROID) 50 MCG tablet Take 50 mcg by mouth daily before breakfast.   Yes Provider, Historical, Waller  olmesartan  (BENICAR ) 40 MG tablet Take 1 tablet (40 mg total) by mouth daily. 10/03/23  Yes Sherry Waller  pantoprazole  (PROTONIX ) 40 MG tablet Take 1 tablet (40 mg total) by mouth daily. 10/04/23  Yes Sherry Waller  traMADol  (ULTRAM ) 50 MG tablet Take 1 tablet (50 mg total) by mouth every 12 (twelve) hours as needed. Patient taking differently: Take 50 mg  by mouth every 12 (twelve) hours as needed for moderate pain (pain score 4-6). 08/21/23  Yes Sherry Waller    Inpatient Medications: Scheduled Meds:  [START ON 10/19/2023] carvedilol   3.125 mg Oral BID WC   Chlorhexidine  Gluconate Cloth  6 each Topical Daily   cloNIDine   0.3 mg Oral TID   diltiazem   120 mg Oral Daily   escitalopram   5 mg Oral Daily   ferrous sulfate   325 mg Oral Q breakfast   gabapentin   100 mg Oral BID   heparin   5,000 Units Subcutaneous Q8H   hydrALAZINE   100 mg Oral TID    insulin  aspart  0-5 Units Subcutaneous QHS   insulin  aspart  0-6 Units Subcutaneous TID WC   isosorbide  mononitrate  60 mg Oral Daily   levothyroxine   50 mcg Oral QAC breakfast   megestrol   400 mg Oral Daily   sodium chloride  flush  3-10 mL Intravenous Q12H   Continuous Infusions:  PRN Meds: acetaminophen , albuterol , bisacodyl , hydrALAZINE , ondansetron  **OR** ondansetron  (ZOFRAN ) IV, oxyCODONE , polyethylene glycol, sodium chloride  flush, traZODone   Allergies:    Allergies  Allergen Reactions   Fenofibrate Other (See Comments)    Aches   Statins Itching   Sulfa Antibiotics Other (See Comments)    headahces    Social History:   Social History   Socioeconomic History   Marital status: Married    Spouse name: Not on file   Number of children: 3   Years of education: 12+   Highest education level: Not on file  Occupational History   Not on file  Tobacco Use   Smoking status: Never   Smokeless tobacco: Never  Vaping Use   Vaping status: Never Used  Substance and Sexual Activity   Alcohol  use: No   Drug use: No   Sexual activity: Yes    Birth control/protection: Surgical  Other Topics Concern   Not on file  Social History Narrative   Drinks 1 cup of coffee a day    Social Drivers of Corporate Investment Banker Strain: Not on file  Food Insecurity: No Food Insecurity (10/11/2023)   Hunger Vital Sign    Worried About Running Out of Food in the Last Year: Never true    Ran Out of Food in the Last Year: Never true  Transportation Needs: No Transportation Needs (10/11/2023)   PRAPARE - Administrator, Civil Service (Medical): No    Lack of Transportation (Non-Medical): No  Physical Activity: Not on file  Stress: Not on file  Social Connections: Socially Isolated (10/11/2023)   Social Connection and Isolation Panel [NHANES]    Frequency of Communication with Friends and Family: Never    Frequency of Social Gatherings with Friends and Family: Never     Attends Religious Services: Never    Database Administrator or Organizations: No    Attends Banker Meetings: Never    Marital Status: Married  Catering Manager Violence: Not At Risk (10/11/2023)   Humiliation, Afraid, Rape, and Kick questionnaire    Fear of Current or Ex-Partner: No    Emotionally Abused: No    Physically Abused: No    Sexually Abused: No    Family History:     Family History  Problem Relation Age of Onset   Brain cancer Father    Ovarian cancer Sister    Brain cancer Sister      ROS:  Please see the history of present illness.  Review of Systems  Constitutional: Positive for decreased appetite, fever and malaise/fatigue.  HENT: Negative.    Eyes: Negative.   Cardiovascular: Negative.   Respiratory: Negative.    Hematologic/Lymphatic: Negative.   Musculoskeletal: Negative.  Negative for joint pain.  Gastrointestinal:  Positive for nausea.  Genitourinary: Negative.   Neurological:  Positive for weakness.    All other ROS reviewed and negative.     Physical Exam/Data:   Vitals:   10/18/23 1118 10/18/23 1126 10/18/23 1130 10/18/23 1200  BP: (!) 131/22   (!) 126/27  Pulse:  (!) 47 (!) 47 (!) 47  Resp:  13 14 13   Temp:  97.7 F (36.5 C)    TempSrc:  Oral    SpO2:  95% 95% 96%  Weight:      Height:        Intake/Output Summary (Last 24 hours) at 10/18/2023 1244 Last data filed at 10/18/2023 9077 Gross per 24 hour  Intake 240 ml  Output 900 ml  Net -660 ml      10/18/2023    5:00 AM 10/17/2023    5:00 AM 10/15/2023    3:57 PM  Last 3 Weights  Weight (lbs) 166 lb 14.2 oz 190 lb 7.6 oz 186 lb 11.7 oz  Weight (kg) 75.7 kg 86.4 kg 84.7 kg     Body mass index is 27.77 kg/m.  General:  Well nourished, well developed, sickly appearing HEENT: normal Neck: no JVD Vascular: bilateral carotid and subclavian bruits; Distal pulses 2+ bilaterally Cardiac:  normal S1, S2; RRR; 1/6 systolic murmur RSB Lungs:  clear to auscultation bilaterally,  no wheezing, rhonchi or rales  Abd: soft, nontender, no hepatomegaly  Ext: no edema Musculoskeletal:  No deformities, BUE and BLE strength normal and equal Skin: warm and dry  Neuro:  CNs 2-12 intact, no focal abnormalities noted Psych:  Normal affect   EKG:  The EKG was personally reviewed and demonstrates:  NSR, poor ant R wave progression Telemetry:  Telemetry was personally reviewed and demonstrates:  sinus brady 40-50's  Relevant CV Studies: Echo 10/02/23 IMPRESSIONS     1. Left ventricular ejection fraction, by estimation, is 65 to 70%. The  left ventricle has normal function. The left ventricle has no regional  wall motion abnormalities. There is moderate concentric left ventricular  hypertrophy. Left ventricular  diastolic parameters are indeterminate.   2. Right ventricular systolic function is normal. The right ventricular  size is normal.   3. Left atrial size was mildly dilated.   4. The mitral valve is abnormal. Mild to moderate mitral valve  regurgitation. Moderate mitral annular calcification.   5. The aortic valve is tricuspid. There is mild calcification of the  aortic valve. Aortic valve regurgitation is not visualized. Aortic valve  sclerosis/calcification is present, without any evidence of aortic  stenosis.   6. The inferior vena cava is normal in size with greater than 50%  respiratory variability, suggesting right atrial pressure of 3 mmHg.   Comparison(s): No prior Echocardiogram.    Laboratory Data:  High Sensitivity Troponin:   Recent Labs  Lab 10/01/23 0454 10/01/23 0638 10/11/23 0640  TROPONINIHS 130* 210* 57*     Chemistry Recent Labs  Lab 10/16/23 0350 10/16/23 0720 10/16/23 1211 10/17/23 0619 10/18/23 0404  NA 123*   < > 126* 127* 129*  K 4.5  --   --  4.6 5.0  CL 90*  --   --  92* 95*  CO2 24  --   --  26 26  GLUCOSE 199*  --   --  125* 125*  BUN 66*  --   --  63* 59*  CREATININE 2.46*  --   --  1.87* 1.81*  CALCIUM 8.4*  --    --  8.8* 8.8*  GFRNONAA 19*  --   --  26* 27*  ANIONGAP 9  --   --  9 8   < > = values in this interval not displayed.    Recent Labs  Lab 10/15/23 0335 10/17/23 0619 10/18/23 0404  PROT 5.5* 5.6* 5.6*  ALBUMIN  2.3*  2.2* 2.5* 2.5*  AST 12* 10* 11*  ALT 13 11 11   ALKPHOS 92 88 85  BILITOT 0.4 0.5 0.6   Lipids  Recent Labs  Lab 10/16/23 0914  CHOL 121  TRIG 108  HDL 22*  LDLCALC 77  CHOLHDL 5.5    Hematology Recent Labs  Lab 10/13/23 0410 10/15/23 0335  WBC 11.2* 9.7  RBC 2.99* 2.86*  HGB 8.6* 8.2*  HCT 27.0* 25.4*  MCV 90.3 88.8  MCH 28.8 28.7  MCHC 31.9 32.3  RDW 13.6 13.5  PLT 266 288   Thyroid   Recent Labs  Lab 10/15/23 0335  TSH 2.168    BNPNo results for input(s): BNP, PROBNP in the last 168 hours.  DDimer No results for input(s): DDIMER in the last 168 hours.   Radiology/Studies:  US  Venous Img Upper Uni Left (DVT) Result Date: 10/15/2023 CLINICAL DATA:  Left arm swelling EXAM: LEFT UPPER EXTREMITY VENOUS DOPPLER ULTRASOUND TECHNIQUE: Gray-scale sonography with graded compression, as well as color Doppler and duplex ultrasound were performed to evaluate the upper extremity deep venous system from the level of the subclavian vein and including the jugular, axillary, basilic, radial, ulnar and upper cephalic vein. Spectral Doppler was utilized to evaluate flow at rest and with distal augmentation maneuvers. COMPARISON:  None Available. FINDINGS: Contralateral Subclavian Vein: Respiratory phasicity is normal and symmetric with the symptomatic side. No evidence of thrombus. Normal compressibility. Internal Jugular Vein: No evidence of thrombus. Normal compressibility, respiratory phasicity and response to augmentation. Subclavian Vein: No evidence of thrombus. Normal compressibility, respiratory phasicity and response to augmentation. Axillary Vein: No evidence of thrombus. Normal compressibility, respiratory phasicity and response to augmentation.  Cephalic Vein: Thrombus is noted with decreased compressibility Basilic Vein: No evidence of thrombus. Normal compressibility, respiratory phasicity and response to augmentation. Brachial Veins: No evidence of thrombus. Normal compressibility, respiratory phasicity and response to augmentation. Radial Veins: No evidence of thrombus. Normal compressibility, respiratory phasicity and response to augmentation. Ulnar Veins: No evidence of thrombus. Normal compressibility, respiratory phasicity and response to augmentation. Venous Reflux:  None visualized. Other Findings:  None visualized. IMPRESSION: No evidence of DVT within the left upper extremity. Superficial cephalic vein thrombus is noted. Electronically Signed   By: Oneil Devonshire M.D.   On: 10/15/2023 20:32   DG CHEST PORT 1 VIEW Result Date: 10/15/2023 CLINICAL DATA:  Shortness of breath EXAM: PORTABLE CHEST 1 VIEW COMPARISON:  10/11/2023 FINDINGS: Stable cardiomediastinal contours. Aortic atherosclerosis. Chronic coarsened interstitial markings are identified bilateral. New asymmetric opacification within the left base compatible with atelectasis and or airspace disease. There is blunting of the left costophrenic angle which may be due to atelectasis. A pleural effusion is not excluded. IMPRESSION: 1. New asymmetric opacification within the left base compatible with atelectasis and/or airspace disease. 2. Blunting of the left costophrenic angle may be due to a small pleural effusion. 3. Diffuse chronic interstitial coarsening noted  bilaterally. Electronically Signed   By: Waddell Calk M.D.   On: 10/15/2023 13:09     Assessment and Plan:   HTN with widened pulse pressure manually I got 138/42 both arms.She is bradycardic and and on multiple meds as described above. HTN managed by renal for many years. Consider reducing or stopping diltiazem  to allow HR to come up. No aortic valve disease. She does have significant vascular disease with 70-99%  mesenteric, occluded L renal art and 1-59% right renal art 2019. Carotid, subclavian bruits but no studies done. Dr. Alvan to evaluate as well.   AKI/CKD stge 3 improved after 3% saline  Hyponatremia sodium 118 improved 129 with 3% saline.   Anemia w/u for multiple myeloma  Acute hypoxic respiratory failure v/q low prob.   Risk Assessment/Risk Scores:                For questions or updates, please contact [facility] Please consult [email or web address] for contact info under    Signed, Olivia Pavy, PA-C  10/18/2023 12:44 PM   Attending note Patient seen and discussed with PA Pavy, I agree with her documentation. 88 yo female history of aggressive HTN, renal artery stenosis, CKD, admitted with AKI on CKD, fever, hypoxia, hyponatremia, anemia. During admission issues with bp control cardiology consulted.   HTN -long history of accelerated HTN on multiple meds at home - home regimen of coreg , clonidine , diltiazem , hydralazine , imdur , olmesartan . - dinamaps can often underestimate DBP and appear to be doing so this admission. Manual bp 138/42 - her pulse pressure on outpatient visits has been 90-100. On admission manual pulse pressure is imilar to prior pulse pressures.  - Echo without aortic regurgitation, normal thyroid , no severe anemia or high output state. Would appear to be age related/arterial stiffening driven wide pulse pressure, chronic pulse pressure 90-100 from chart review. No further workup is indicated.   2. Bradycardia - in setting of coreg  3.125mg  bid (was on 12.5mg  bid with dose last night), clonidine  0.3.mg tid, diltiazem  120mg  daily (lowered form 240mg  earlier this admit).  - I don't seen any particular indication for diltiazem  over amlodopine. Would d/c diltiazem , start norvasc  10mg  daily tomorrow since got dilt today  3. AKI on CKD - from nephrology notes likely ATN, now in recovery phase   4. Hyponatremia - per primary team and renal - has  required 3% NS  We will follow bp peripherally tomorrow, no additional cardiology recs at this time.   Dorn Alvan Waller

## 2023-10-18 NOTE — NC FL2 (Signed)
 [place]  MEDICAID FL2 LEVEL OF CARE FORM     IDENTIFICATION  Patient Name: Sherry Waller Birthdate: Apr 29, 1936 Sex: female Admission Date (Current Location): 10/11/2023  [facility] and [place] Number:  [employer] and Address:  [facility],  [address] [place] [address]      Provider Number: [id number]  Attending Physician Name and Address:  Willette Adriana LABOR, MD  Relative Name and Phone Number:  [name], [name])  [phone]    Current Level of Care: Hospital Recommended Level of Care: Skilled Nursing Facility Prior Approval Number:    Date Approved/Denied:   PASRR Number: [id number]  Discharge Plan: SNF    Current Diagnoses: Patient Active Problem List   Diagnosis Date Noted   AKI (acute kidney injury) (HCC) 10/12/2023   Acute respiratory failure with hypoxia (HCC) 10/11/2023   HTN (hypertension), malignant 10/11/2023   Hypothyroidism 10/11/2023   CKD (chronic kidney disease) stage 3B- 10/11/2023   Hypertensive urgency 10/01/2023   DOE p covid 19 10/09/2022    Orientation RESPIRATION BLADDER Height & Weight     Self, Time, Situation, Place  Normal External catheter, Continent Weight: 75.7 kg Height:  5' 5 (165.1 cm)  BEHAVIORAL SYMPTOMS/MOOD NEUROLOGICAL BOWEL NUTRITION STATUS      Continent Diet (See DC Summary)  AMBULATORY STATUS COMMUNICATION OF NEEDS Skin   Extensive Assist Verbally Normal                       Personal Care Assistance Level of Assistance  Bathing, Feeding, Dressing Bathing Assistance: Maximum assistance Feeding assistance: Limited assistance Dressing Assistance: Maximum assistance     Functional Limitations Info  Sight, Hearing, Speech Sight Info: Adequate Hearing Info: Adequate Speech Info: Adequate    SPECIAL CARE FACTORS FREQUENCY  PT (By licensed PT)     PT Frequency: 5 times a week              Contractures Contractures Info: Not present    Additional Factors Info   Code Status, Allergies Code Status Info: DNR - limited Allergies Info: Fenofibrate, statins, sulfa           Current Medications (10/18/2023):  This is the current hospital active medication list Current Facility-Administered Medications  Medication Dose Route Frequency Provider Last Rate Last Admin   acetaminophen  (TYLENOL ) tablet 650 mg  650 mg Oral Q6H PRN Shahmehdi, Seyed A, MD   650 mg at 10/18/23 1514   albuterol  (PROVENTIL ) (2.5 MG/3ML) 0.083% nebulizer solution 2.5 mg  2.5 mg Nebulization Q2H PRN Willette Adriana LABOR, MD       [START ON 10/19/2023] amLODipine  (NORVASC ) tablet 10 mg  10 mg Oral Daily Branch, Dorn FALCON, MD       bisacodyl  (DULCOLAX) suppository 10 mg  10 mg Rectal Daily PRN Willette Adriana LABOR, MD       [START ON 10/19/2023] carvedilol  (COREG ) tablet 3.125 mg  3.125 mg Oral BID WC Shahmehdi, Seyed A, MD       Chlorhexidine  Gluconate Cloth 2 % PADS 6 each  6 each Topical Daily Shahmehdi, Seyed A, MD   6 each at 10/18/23 9076   cloNIDine  (CATAPRES ) tablet 0.3 mg  0.3 mg Oral TID Shahmehdi, Seyed A, MD   0.3 mg at 10/18/23 1514   escitalopram  (LEXAPRO ) tablet 5 mg  5 mg Oral Daily Shahmehdi, Seyed A, MD   5 mg at 10/18/23 0850   ferrous sulfate  tablet 325 mg  325 mg Oral Q breakfast Shahmehdi,  Seyed A, MD   325 mg at 10/18/23 0851   gabapentin  (NEURONTIN ) capsule 100 mg  100 mg Oral BID Shahmehdi, Seyed A, MD   100 mg at 10/18/23 1259   heparin  injection 5,000 Units  5,000 Units Subcutaneous Q8H Shahmehdi, Seyed A, MD   5,000 Units at 10/18/23 1514   hydrALAZINE  (APRESOLINE ) injection 10 mg  10 mg Intravenous Q6H PRN Shahmehdi, Seyed A, MD       hydrALAZINE  (APRESOLINE ) tablet 100 mg  100 mg Oral TID Willette Jest A, MD   100 mg at 10/18/23 1514   insulin  aspart (novoLOG ) injection 0-5 Units  0-5 Units Subcutaneous QHS Shahmehdi, Seyed A, MD   5 Units at 10/14/23 2225   insulin  aspart (novoLOG ) injection 0-6 Units  0-6 Units Subcutaneous TID WC Shahmehdi, Jest A, MD   1  Units at 10/16/23 1257   isosorbide  mononitrate (IMDUR ) 24 hr tablet 60 mg  60 mg Oral Daily Shahmehdi, Seyed A, MD   60 mg at 10/18/23 9148   levothyroxine  (SYNTHROID ) tablet 50 mcg  50 mcg Oral QAC breakfast Shahmehdi, Seyed A, MD   50 mcg at 10/18/23 9470   megestrol  (MEGACE ) 400 MG/10ML suspension 400 mg  400 mg Oral Daily Shahmehdi, Seyed A, MD   400 mg at 10/18/23 9148   ondansetron  (ZOFRAN ) tablet 4 mg  4 mg Oral Q6H PRN Shahmehdi, Jest LABOR, MD       Or   ondansetron  (ZOFRAN ) injection 4 mg  4 mg Intravenous Q6H PRN Shahmehdi, Seyed A, MD   4 mg at 10/14/23 1436   oxyCODONE  (Oxy IR/ROXICODONE ) immediate release tablet 5 mg  5 mg Oral Q12H PRN Shahmehdi, Seyed A, MD   5 mg at 10/17/23 0859   polyethylene glycol (MIRALAX  / GLYCOLAX ) packet 17 g  17 g Oral Daily PRN Shahmehdi, Seyed A, MD       sodium chloride  flush (NS) 0.9 % injection 3-10 mL  3-10 mL Intravenous Q12H Shahmehdi, Seyed A, MD   10 mL at 10/18/23 0855   sodium chloride  flush (NS) 0.9 % injection 3-10 mL  3-10 mL Intravenous PRN Shahmehdi, Seyed A, MD       traZODone  (DESYREL ) tablet 50 mg  50 mg Oral QHS PRN Willette Jest LABOR, MD         Discharge Medications: Please see discharge summary for a list of discharge medications.  Relevant Imaging Results:  Relevant Lab Results:   Additional Information SS# [id number]  [name], RN

## 2023-10-18 NOTE — Plan of Care (Signed)
  Problem: Acute Rehab PT Goals(only PT should resolve) Goal: Pt Will Go Supine/Side To Sit Flowsheets (Taken 10/18/2023 1527) Pt will go Supine/Side to Sit: Independently Goal: Patient Will Transfer Sit To/From Stand Flowsheets (Taken 10/18/2023 1527) Patient will transfer sit to/from stand: Independently Goal: Pt Will Transfer Bed To Chair/Chair To Bed Flowsheets (Taken 10/18/2023 1527) Pt will Transfer Bed to Chair/Chair to Bed: Independently Goal: Pt Will Perform Standing Balance Or Pre-Gait Flowsheets (Taken 10/18/2023 1527) Pt will perform standing balance or pre-gait: Independently Goal: Pt Will Ambulate Flowsheets (Taken 10/18/2023 1527) Pt will Ambulate:  50 feet  with modified independence  with rolling walker  100 feet  Omega JONETTA Donna ALMETA, DPT [facility] [place] [phone] office

## 2023-10-18 NOTE — TOC Progression Note (Signed)
 Transition of Care [facility]) - Progression Note    Patient Details  Name: Sherry Waller MRN: [id number] Date of Birth: [birth year]  [facility] CM/SW Contact  [name], RN Phone Number: 10/18/2023, 5:08 PM  Clinical Narrative:   PT is recommending SNF. CM spoke with her sons. Their father is at [facility], they requested to check bed status and possible same room. TOC will complete FL2 and check with [facility] to see if they have available beds. TOC following to discuss bed offers and start INS Auth.     Expected Discharge Plan: Skilled Nursing Facility Barriers to Discharge: Continued Medical Work up, No SNF bed  Expected Discharge Plan and Services In-house Referral: Clinical Social Work   Post Acute Care Choice: Durable Medical Equipment Living arrangements for the past 2 months: Single Family Home                                       Social Determinants of Health (SDOH) Interventions SDOH Screenings   Food Insecurity: No Food Insecurity (10/11/2023)  Housing: Low Risk  (10/11/2023)  Transportation Needs: No Transportation Needs (10/11/2023)  Utilities: Not At Risk (10/11/2023)  Social Connections: Socially Isolated (10/11/2023)  Tobacco Use: Low Risk  (10/11/2023)    Readmission Risk Interventions    10/13/2023   11:03 AM  Readmission Risk Prevention Plan  Transportation Screening Complete  HRI or Home Care Consult Complete  Social Work Consult for Recovery Care Planning/Counseling Complete  Palliative Care Screening Not Applicable  Medication Review Oceanographer) Complete

## 2023-10-18 NOTE — Progress Notes (Signed)
 PROGRESS NOTE   Sherry Waller, is a 88 y.o. female, DOB - 05-08-1936, FMW:[id number]  Admit date - 10/11/2023   Admitting Physician Courage Pearlean, MD  Outpatient Primary MD for the patient is [name], MD  LOS - 6  Chief Complaint  Patient presents with   Shortness of Breath      Brief Narrative:   Sherry Waller is  88 y.o. female with past medical history significant of hypothyroidism, depression, malignant HTN, DM2, CKD 3B, and  GERD admitted on 10/11/2023 with AKI on CKD and uncontrolled hypertension     Subjective: The patient was seen and examined this morning, awake alert, following command, lethargic Complaining of leg pain.     -Assessment and Plan:  1)AKI---CKD stage IIIb Continue to improve -acute kidney injury on CKD stage -3B with an anion gap metabolic acidosis--- -?? AiN, ATN--  --in the setting of loose stools and poor oral intake --Creatinine is up to 2.23 >> 2.50,  -creatinine was 1.1 on 10/01/2023 Lab Results  Component Value Date   CREATININE 1.81 (H) 10/18/2023   CREATININE 1.87 (H) 10/17/2023   CREATININE 2.46 (H) 10/16/2023   -BUN is up to 67 >>69 from 17 on 10/01/2023 -Bicarb is 18 >16>20>>24, 26  -on 10/12/23-- Nephrologist Dr. Marlee recommended iV fluid with bicarb in dextrose  until oral intake is more reliable 10/14/23 -- on 10/14/23 Nephrologist Dr. Peebles--recommends stopping IV fluids and starting IV Lasix -- renally adjust medications, avoid nephrotoxic agents / dehydration  / hypotension 10/15/23  -Still having poor p.o. intake -Serum sodium dropped to 118, was started on 3% normal saline per nephrologist  10/16/2023 -Remained on 3% normal saline overnight, -Serum sodium continues to improve-118>>> 126 this morning Per nephrology recommendation once between 125-130 with stop 3% normal saline -Nephrologist concern for possibility of multiple myeloma which could cause isotonic hyponatremia Workup in  progress   10/17/2023 -Patient was seen and examined, awake alert, lethargic -Off hypertonic saline -Sodium has improved 127 this a.m. -Complain of generalized weaknesses -Blood pressure remained stable, systolically elevated, diastolically soft -BP medication continue to be adjusted -appreciate nephrology recommendation  10/18/2023 -Patient was seen and examined, awake alert, remains lethargic, complaining of weakness -Improved sodium to 129, creatinine improved to 1.81, BUN 59, Diastolic blood pressure still running low   Intake/Output Summary (Last 24 hours) at 10/18/2023 1115 Last data filed at 10/18/2023 9077 Gross per 24 hour  Intake 240 ml  Output 900 ml  Net -660 ml       2)FUO----patient with fevers and chills since w greenish productive cough 10/11/2023 Resolved,  --Chest x-ray without acute cardiopulmonary findings -UA is not suggestive of UTI---completed antibiotics for E. coli UTI recently -COVID, RSV and flu negative --No further fevers   3)-D-dimer elevated at 6.59 with acute hypoxic respiratory failure she was found to have O2 sats of 87% required 2 L of oxygen via nasal cannula -VQ scan low probably for PE -ABG without hypercapnia ,  -- hypoxia noted on ABG with PaO2 of 61,  -COVID, RSV and flu testing requested --BNP 1005 no prior---- -troponin is down to 57 was 210 on 10/01/2023, EKG sinus rhythm -- Continue supplemental oxygen and as needed bronchodilators   4)HypoNatremia- -continue to improve as low as 118, today 129  ---serum sodium was as  low as 118, improved with 3% hypertonic saline  Per nephrologist to stop the 3% normal saline once serum sodium is 125-130 Workup not consistent with SIADH, serum osmolality at 290, normal TSH, cortisol  level, -Continue to improve, sodium 127  5)Acute Anemia-----Hgb is down to 8.6 baseline usually between 13 and 14, -Patient denies any blood in the stool, no emesis - stool for occult blood is neg -B12 is low at  136, replace, - folate WNL -Serum iron and iron saturation are low--replace -Anemia labs/workup as above  -Concern for Multiple Myeloma- light chains and myeloma panel has been ordered By nephrologist   6)Malignant HTN--- patient has history of stage II hypertension with difficult to control BP from time to time -Systolic BP still running high, diastolic [place]  -Systolic blood pressure was around 200 mmhg in the ED suspect some component of rebound hypertension in the setting of clonidine  use -Required nicardipine  drip during the recent hospitalization -Continue Coreg , clonidine ,  isosorbide /hydralazine  combo -Reduce Cardizem  CD to 120 mg daily from 240 mg due to bradycardia Reducing metoprolol  -Continue to titrate BP meds -As needed hydralazine /labetalol    7)History of Depression/Neuropathy -Continue treatment with Lexapro  and Neurontin    8)Hypothyroidism----Continue Synthroid    9)DM2-A1c 6.6 reflecting good diabetic control PTA -Use Novolog /Humalog Sliding scale insulin  with Accu-Cheks/Fingersticks as ordered  10)Hyperkalemia--- due to #1 above -Resolved with Lokelma   Status is: Inpatient   Disposition: The patient is from: Home              Anticipated d/c is to: Home              Anticipated d/c date is: 1 to 2 days              Patient currently is not medically stable to d/c. Barriers: Not Clinically Stable-  requesting PT evaluation patient will likely need SNF versus home with home health  Code Status :  -  Code Status: Full Code   Family Communication:   (patient is alert, awake and coherent)  Discussed with pts sons at bedside  DVT Prophylaxis  :   - SCDs  heparin  injection 5,000 Units Start: 10/11/23 2200 SCDs Start: 10/11/23 1450 Place TED hose Start: 10/11/23 1450   Lab Results  Component Value Date   PLT 288 10/15/2023   Inpatient Medications  Scheduled Meds:  [START ON 10/19/2023] carvedilol   3.125 mg Oral BID WC   Chlorhexidine  Gluconate  Cloth  6 each Topical Daily   cloNIDine   0.3 mg Oral TID   diltiazem   120 mg Oral Daily   escitalopram   5 mg Oral Daily   ferrous sulfate   325 mg Oral Q breakfast   gabapentin   100 mg Oral BID   heparin   5,000 Units Subcutaneous Q8H   hydrALAZINE   100 mg Oral TID   insulin  aspart  0-5 Units Subcutaneous QHS   insulin  aspart  0-6 Units Subcutaneous TID WC   isosorbide  mononitrate  60 mg Oral Daily   levothyroxine   50 mcg Oral QAC breakfast   megestrol   400 mg Oral Daily   sodium chloride  flush  3-10 mL Intravenous Q12H   Continuous Infusions:   PRN Meds:.acetaminophen , albuterol , bisacodyl , hydrALAZINE , ondansetron  **OR** ondansetron  (ZOFRAN ) IV, oxyCODONE , polyethylene glycol, sodium chloride  flush, traZODone    Anti-infectives (From admission, onward)    None        Objective: Vitals:   10/18/23 0500 10/18/23 0700 10/18/23 0800 10/18/23 0822  BP: (!) 156/24 (!) 154/27 (!) 160/32   Pulse: (!) 48 (!) 49 (!) 55   Resp: 10 12 13    Temp: 98 F (36.7 C)   98.1 F (36.7 C)  TempSrc: Oral   Oral  SpO2: 95% 96%  94%   Weight: 75.7 kg     Height:        Intake/Output Summary (Last 24 hours) at 10/18/2023 1115 Last data filed at 10/18/2023 9077 Gross per 24 hour  Intake 240 ml  Output 900 ml  Net -660 ml   Filed Weights   10/15/23 1557 10/17/23 0500 10/18/23 0500  Weight: 84.7 kg 86.4 kg 75.7 kg   Physical Exam General:  AAO x 3,  cooperative, no distress;   HEENT:  Normocephalic, PERRL, otherwise with in Normal limits   Neuro:  CNII-XII intact. , normal motor and sensation, reflexes intact   Lungs:   Clear to auscultation BL, Respirations unlabored,  No wheezes / crackles  Cardio:    S1/S2, RRR, No murmure, No Rubs or Gallops   Abdomen:  Soft, non-tender, bowel sounds active all four quadrants, no guarding or peritoneal signs.  Muscular  skeletal:  Limited exam -global generalized weaknesses - in bed, able to move all 4 extremities,   2+ pulses,  symmetric, No  pitting edema  Skin:  Dry, warm to touch, negative for any Rashes,  Wounds: Please see nursing documentation    Foley catheter placed on 10/14/2023 by nephrology order-due to urinary retention and to keep accurate I's and O's>>> will be discontinued today 10/18/2023  Data Reviewed: I have personally reviewed following labs and imaging studies  CBC: Recent Labs  Lab 10/13/23 0410 10/15/23 0335  WBC 11.2* 9.7  HGB 8.6* 8.2*  HCT 27.0* 25.4*  MCV 90.3 88.8  PLT 266 288   Basic Metabolic Panel: Recent Labs  Lab 10/13/23 0410 10/14/23 0417 10/15/23 0335 10/15/23 1354 10/15/23 1645 10/16/23 0350 10/16/23 0720 10/16/23 1211 10/17/23 0619 10/18/23 0404  NA 125*   < > 121* 118*   < > 123* 125* 126* 127* 129*  K 4.9   < > 4.6 4.6  --  4.5  --   --  4.6 5.0  CL 94*   < > 86* 84*  --  90*  --   --  92* 95*  CO2 20*   < > 26 26  --  24  --   --  26 26  GLUCOSE 143*   < > 119* 196*  --  199*  --   --  125* 125*  BUN 64*   < > 65* 69*  --  66*  --   --  63* 59*  CREATININE 2.40*   < > 2.31* 2.50*  --  2.46*  --   --  1.87* 1.81*  CALCIUM 8.6*   < > 8.2* 8.0*  --  8.4*  --   --  8.8* 8.8*  PHOS 3.5  --  4.3  --   --   --   --   --   --   --    < > = values in this interval not displayed.   GFR: Estimated Creatinine Clearance: 23.1 mL/min (A) (by C-G formula based on SCr of 1.81 mg/dL (H)). Liver Function Tests: Recent Labs  Lab 10/13/23 0410 10/15/23 0335 10/17/23 0619 10/18/23 0404  AST  --  12* 10* 11*  ALT  --  13 11 11   ALKPHOS  --  92 88 85  BILITOT  --  0.4 0.5 0.6  PROT  --  5.5* 5.6* 5.6*  ALBUMIN  2.5* 2.3*  2.2* 2.5* 2.5*   Cardiac Enzymes: Recent Labs  Lab 10/12/23 0412  CKTOTAL 405*   Recent Results (from the past  240 hours)  Resp panel by RT-PCR (RSV, Flu A&B, Covid)     Status: None   Collection Time: 10/11/23  4:15 PM  Result Value Ref Range Status   SARS Coronavirus 2 by RT PCR NEGATIVE NEGATIVE Final    Comment: (NOTE) SARS-CoV-2 target nucleic  acids are NOT DETECTED.  The SARS-CoV-2 RNA is generally detectable in upper respiratory specimens during the acute phase of infection. The lowest concentration of SARS-CoV-2 viral copies this assay can detect is 138 copies/mL. A negative result does not preclude SARS-Cov-2 infection and should not be used as the sole basis for treatment or other patient management decisions. A negative result may occur with  improper specimen collection/handling, submission of specimen other than nasopharyngeal swab, presence of viral mutation(s) within the areas targeted by this assay, and inadequate number of viral copies(<138 copies/mL). A negative result must be combined with clinical observations, patient history, and epidemiological information. The expected result is Negative.  Fact Sheet for Patients:  bloggercourse.com  Fact Sheet for Healthcare Providers:  seriousbroker.it  This test is no t yet approved or cleared by the [place]  FDA and  has been authorized for detection and/or diagnosis of SARS-CoV-2 by FDA under an Emergency Use Authorization (EUA). This EUA will remain  in effect (meaning this test can be used) for the duration of the COVID-19 declaration under Section 564(b)(1) of the Act, 21 U.S.C.section 360bbb-3(b)(1), unless the authorization is terminated  or revoked sooner.       Influenza A by PCR NEGATIVE NEGATIVE Final   Influenza B by PCR NEGATIVE NEGATIVE Final    Comment: (NOTE) The Xpert Xpress SARS-CoV-2/FLU/RSV plus assay is intended as an aid in the diagnosis of influenza from Nasopharyngeal swab specimens and should not be used as a sole basis for treatment. Nasal washings and aspirates are unacceptable for Xpert Xpress SARS-CoV-2/FLU/RSV testing.  Fact Sheet for Patients: bloggercourse.com  Fact Sheet for Healthcare Providers: seriousbroker.it  This  test is not yet approved or cleared by the [place]  FDA and has been authorized for detection and/or diagnosis of SARS-CoV-2 by FDA under an Emergency Use Authorization (EUA). This EUA will remain in effect (meaning this test can be used) for the duration of the COVID-19 declaration under Section 564(b)(1) of the Act, 21 U.S.C. section 360bbb-3(b)(1), unless the authorization is terminated or revoked.     Resp Syncytial Virus by PCR NEGATIVE NEGATIVE Final    Comment: (NOTE) Fact Sheet for Patients: bloggercourse.com  Fact Sheet for Healthcare Providers: seriousbroker.it  This test is not yet approved or cleared by the [place]  FDA and has been authorized for detection and/or diagnosis of SARS-CoV-2 by FDA under an Emergency Use Authorization (EUA). This EUA will remain in effect (meaning this test can be used) for the duration of the COVID-19 declaration under Section 564(b)(1) of the Act, 21 U.S.C. section 360bbb-3(b)(1), unless the authorization is terminated or revoked.  Performed at [facility], [address] [place], [place] [address]      Scheduled Meds:  [START ON 10/19/2023] carvedilol   3.125 mg Oral BID WC   Chlorhexidine  Gluconate Cloth  6 each Topical Daily   cloNIDine   0.3 mg Oral TID   diltiazem   120 mg Oral Daily   escitalopram   5 mg Oral Daily   ferrous sulfate   325 mg Oral Q breakfast   gabapentin   100 mg Oral BID   heparin   5,000 Units Subcutaneous Q8H   hydrALAZINE   100 mg Oral TID   insulin   aspart  0-5 Units Subcutaneous QHS   insulin  aspart  0-6 Units Subcutaneous TID WC   isosorbide  mononitrate  60 mg Oral Daily   levothyroxine   50 mcg Oral QAC breakfast   megestrol   400 mg Oral Daily   sodium chloride  flush  3-10 mL Intravenous Q12H   Continuous Infusions:    LOS: 6 days   Adriana DELENA Grams M.D on 10/18/2023 at 11:15 AM Critical care time 55 minutes-was spent seeing evaluating patient,  drawn plan of care, discussing with consultants, reviewing all labs, medications  Go to [email or web address] - for contact info  [facility] - Office  [phone]  If 7PM-7AM, please contact night-coverage [email or web address] 10/18/2023, 11:15 AM

## 2023-10-19 DIAGNOSIS — J9601 Acute respiratory failure with hypoxia: Secondary | ICD-10-CM | POA: Diagnosis not present

## 2023-10-19 LAB — COMPREHENSIVE METABOLIC PANEL
ALT: 11 U/L (ref 0–44)
AST: 11 U/L — ABNORMAL LOW (ref 15–41)
Albumin: 2.5 g/dL — ABNORMAL LOW (ref 3.5–5.0)
Alkaline Phosphatase: 80 U/L (ref 38–126)
Anion gap: 6 (ref 5–15)
BUN: 51 mg/dL — ABNORMAL HIGH (ref 8–23)
CO2: 27 mmol/L (ref 22–32)
Calcium: 8.9 mg/dL (ref 8.9–10.3)
Chloride: 95 mmol/L — ABNORMAL LOW (ref 98–111)
Creatinine, Ser: 1.66 mg/dL — ABNORMAL HIGH (ref 0.44–1.00)
GFR, Estimated: 30 mL/min — ABNORMAL LOW (ref >60)
Glucose, Bld: 114 mg/dL — ABNORMAL HIGH (ref 70–99)
Potassium: 4.7 mmol/L (ref 3.5–5.1)
Sodium: 128 mmol/L — ABNORMAL LOW (ref 135–145)
Total Bilirubin: 0.5 mg/dL (ref 0.0–1.2)
Total Protein: 5.4 g/dL — ABNORMAL LOW (ref 6.5–8.1)

## 2023-10-19 LAB — ANCA TITERS
Atypical P-ANCA titer: <1:20 {titer}
C-ANCA: <1:20 {titer}
P-ANCA: <1:20 {titer}

## 2023-10-19 LAB — HISTONE ANTIBODIES, IGG, BLOOD: DNA-Histone: 0.8 U (ref 0.0–0.9)

## 2023-10-19 LAB — GLUCOSE, CAPILLARY
Glucose-Capillary: 111 mg/dL — ABNORMAL HIGH (ref 70–99)
Glucose-Capillary: 144 mg/dL — ABNORMAL HIGH (ref 70–99)
Glucose-Capillary: 134 mg/dL — ABNORMAL HIGH (ref 70–99)
Glucose-Capillary: 154 mg/dL — ABNORMAL HIGH (ref 70–99)

## 2023-10-19 LAB — ANTI-DNA ANTIBODY, DOUBLE-STRANDED: ds DNA Ab: <1 [IU]/mL (ref 0–9)

## 2023-10-19 LAB — PHOSPHORUS: Phosphorus: 3.7 mg/dL (ref 2.5–4.6)

## 2023-10-19 LAB — COMPLEMENT, TOTAL: Compl, Total (CH50): >60 U/mL (ref >41)

## 2023-10-19 LAB — ANA W/REFLEX IF POSITIVE: Anti Nuclear Antibody (ANA): NEGATIVE

## 2023-10-19 MED ORDER — AMLODIPINE BESYLATE 10 MG PO TABS: 10.0000 mg | ORAL_TABLET | Freq: Every day | ORAL | 1 refills | Status: DC

## 2023-10-19 MED ORDER — HYDRALAZINE HCL 100 MG PO TABS: 100.0000 mg | ORAL_TABLET | Freq: Three times a day (TID) | ORAL | 1 refills | Status: DC

## 2023-10-19 MED ORDER — MEGESTROL ACETATE 400 MG/10ML PO SUSP: 400.0000 mg | Freq: Every day | ORAL | 0 refills | Status: DC

## 2023-10-19 MED ORDER — INSULIN ASPART 100 UNIT/ML IJ SOLN: 0.0000 [IU] | Freq: Three times a day (TID) | INTRAMUSCULAR | 11 refills | Status: DC

## 2023-10-19 MED ORDER — FERROUS SULFATE 325 (65 FE) MG PO TABS: 325.0000 mg | ORAL_TABLET | Freq: Every day | ORAL | 3 refills | Status: DC

## 2023-10-19 MED ORDER — ESCITALOPRAM OXALATE 5 MG PO TABS: 5.0000 mg | ORAL_TABLET | Freq: Every day | ORAL | 1 refills | Status: DC

## 2023-10-19 MED ORDER — ORAL CARE MOUTH RINSE: 15.0000 mL | OROMUCOSAL | 0 refills | Status: DC | PRN

## 2023-10-19 NOTE — Progress Notes (Signed)
 Follow heart rates off diltiazem, remains on coreg and clonidine. Chronically wide pulse pressure likely age related, no evidence of acute pathology as etiology. No additional cardiology recs today, follow vitals peripherally tomorrow  Dominga Ferry MD

## 2023-10-19 NOTE — TOC Progression Note (Signed)
 Transition of Care [facility]) - Progression Note    Patient Details  Name: Sherry Waller MRN: [id number] Date of Birth: [birth year]  [facility] CM/SW Contact  [name], RN Phone Number: 10/19/2023, 11:08 AM  Clinical Narrative:   Discussed bed offers with [name], They do want her at [facility]. TOC starting INS AUTH.    Expected Discharge Plan: Skilled Nursing Facility Barriers to Discharge: Continued Medical Work up, No SNF bed  Expected Discharge Plan and Services In-house Referral: Clinical Social Work   Post Acute Care Choice: Durable Medical Equipment Living arrangements for the past 2 months: Single Family Home                   Social Determinants of Health (SDOH) Interventions SDOH Screenings   Food Insecurity: No Food Insecurity (10/11/2023)  Housing: Low Risk  (10/11/2023)  Transportation Needs: No Transportation Needs (10/11/2023)  Utilities: Not At Risk (10/11/2023)  Social Connections: Socially Isolated (10/11/2023)  Tobacco Use: Low Risk  (10/11/2023)    Readmission Risk Interventions    10/13/2023   11:03 AM  Readmission Risk Prevention Plan  Transportation Screening Complete  HRI or Home Care Consult Complete  Social Work Consult for Recovery Care Planning/Counseling Complete  Palliative Care Screening Not Applicable  Medication Review Oceanographer) Complete

## 2023-10-19 NOTE — Progress Notes (Signed)
   Advanced Care Planning Note   Patient Name: Sherry Waller       Date: 10/19/2023 DOB: 26-Nov-1935  Age: 88 y.o. MRN#: [id number] Attending Physician: Willette Adriana LABOR, MD Primary Care Physician: [name], MD Admit Date: 10/11/2023 Length of Stay: 7 days  Advanced Care Planning Details:   Persons Present: ACP Persons Present: Patient and Patient's Son(s) Sherry Waller  Provider(s) Present: Palliative Medicine Provider Camellia Kays, DNP, NP-C  All persons present were open and agreeable to conversation about advanced care planning. Education offered on the importance of documentation and the logistics of securing advanced directives.   We had in-depth discussion on acute illness, chronic comorbidities, ongoing workup (see consult note from today for details).  Additionally discussed advance care planning with specific focus on CODE STATUS.  Education was offered on the following:   Available ACP documents able to be completed and Importance/Benefit of completing ACP documents  Forms completed include:  DNR ([place]) These forms can be found in the ACP link in the area under the patient's picture  After conversation, patient and/or family determined the desired discharge disposition at this time would be: To Be Determined   Summary of ACP Decisions:   Changed from full code to DNR-limited per patient wishes Ongoing GOC conversations for further decisions as outcomes and data come back     Thank you for allowing us  to participate in the care of [name] PMT will continue to support holistically.  Total ACP Time: 20 min  The time above is specific to time spend on advanced care planning. It does not include any separately billed services.  Signed by: Camellia Kays, DNP, AGNP-C Palliative Medicine Team  Team Phone # [phone] (Nights/Weekends)  10/19/2023, 9:48 AM

## 2023-10-19 NOTE — Discharge Summary (Signed)
 Physician Discharge Summary   Patient: Sherry Waller MRN: [id number] DOB: [birth year]  Admit date:     10/11/2023  Discharge date: 10/19/23  Discharge Physician: Adriana DELENA Grams   PCP: [name], MD   Recommendations at discharge:    Follow-up with PCP in 2-4 weeks Follow-up with cardiology in 1 week With nephrologist in 1 week Current medication needs further adjustment for better blood pressure control, monitor electrolytes BMP in 1 week-  results to PCP  6.  Follow-up with nephrologist regarding workup and ruling out multiple myeloma    Discharge Diagnoses: Principal Problem:   Acute respiratory failure with hypoxia (HCC) Active Problems:   HTN (hypertension), malignant   Hypothyroidism   CKD (chronic kidney disease) stage 3B-   AKI (acute kidney injury) (HCC)   Sherry Waller is  88 y.o. female with past medical history significant of hypothyroidism, depression, malignant HTN, DM2, CKD 3B, and  GERD admitted on 10/11/2023 with AKI on CKD and uncontrolled hypertension        -Assessment and Plan:   1)AKI---CKD stage IIIa Continue to improve -acute kidney injury on CKD stage -3a with an anion gap metabolic acidosis--- -?? AiN, ATN--  --in the setting of loose stools and poor oral intake --Creatinine is up to 2.23 >> 2.50,  -creatinine was 1.1 on 10/01/2023 Lab Results  Component Value Date   CREATININE 1.66 (H) 10/19/2023   CREATININE 1.81 (H) 10/18/2023   CREATININE 1.87 (H) 10/17/2023      -BUN is up to 67 >>69 from 17 on 10/01/2023 -Bicarb is 18 >16>20>>24, 26  -on 10/12/23-- Nephrologist Dr. Marlee recommended iV fluid with bicarb in dextrose  until oral intake is more reliable 10/14/23 -- on 10/14/23 Nephrologist Dr. Peebles--recommends stopping IV fluids and starting IV Lasix -- renally adjust medications, avoid nephrotoxic agents / dehydration  / hypotension 10/15/23  -Still having poor p.o. intake -Serum sodium dropped to 118, was started on 3%  normal saline per nephrologist   10/16/2023 -Remained on 3% normal saline overnight, -Serum sodium continues to improve-118>>> 126 this morning Per nephrology recommendation once between 125-130 with stop 3% normal saline -Nephrologist concern for possibility of multiple myeloma which could cause isotonic hyponatremia Workup in progress     10/17/2023 -Patient was seen and examined, awake alert, lethargic -Off hypertonic saline -Sodium has improved 127 this a.m. -Complain of generalized weaknesses -Blood pressure remained stable, systolically elevated, diastolically soft -BP medication continue to be adjusted -appreciate nephrology recommendation   10/18/2023 -Patient was seen and examined, awake alert, remains lethargic, complaining of weakness -Improved sodium to 129, creatinine improved to 1.81, BUN 59, Diastolic blood pressure still running low   10/19/2023 Patient remained stable, cleared by consultants to be discharged  By pulse pressure excepted due to advanced age, atherosclerosis     2)FUO----patient with fevers and chills since w greenish productive cough 10/11/2023 Resolved,   --Chest x-ray without acute cardiopulmonary findings -UA is not suggestive of UTI---completed antibiotics for E. coli UTI recently -COVID, RSV and flu negative --No further fevers   3)-D-dimer elevated at 6.59 with acute hypoxic respiratory failure she was found to have O2 sats of 87% required 2 L of oxygen via nasal cannula -VQ scan low probably for PE -ABG without hypercapnia ,  -- hypoxia noted on ABG with PaO2 of 61,  -COVID, RSV and flu testing requested --BNP 1005 no prior---- -troponin is down to 57 was 210 on 10/01/2023, EKG sinus rhythm -- Continue supplemental oxygen and as needed bronchodilators  4)HypoNatremia- -continue to improve as low as 118, today 129   ---serum sodium was as  low as 118, improved with 3% hypertonic saline  Per nephrologist to stop the 3% normal saline  once serum sodium is 125-130 Workup not consistent with SIADH, serum osmolality at 290, normal TSH, cortisol level, -Continue to improve, sodium 127   5)Acute Anemia-----Hgb is down to 8.6 baseline usually between 13 and 14, -Patient denies any blood in the stool, no emesis - stool for occult blood is neg -B12 is low at 136, replace, - folate WNL -Serum iron and iron saturation are low--replace -Anemia labs/workup as above   -Concern for Multiple Myeloma- light chains and myeloma panel has been ordered By nephrologist--- Follow-up with outpatient results and workup     6)Malignant HTN--- patient has history of stage II hypertension with difficult to control BP from time to time Wide pulse pressure excepted due to arterial sclerosis, advanced age Continue current meds    -Systolic blood pressure was around 200 mmhg in the ED suspect some component of rebound hypertension in the setting of clonidine  use -Required nicardipine  drip during the recent hospitalization  -Continue: -Cardizem  due to bradycardia, -Continue carvedilol  at 3.125, zotepine 10 mg daily, clonidine  0.3 mg p.o. 3 times daily, hydralazine  100 mg p.o. 3 times daily, otherwise 60 mg p.o. daily,     7)History of Depression/Neuropathy -Continue treatment with Lexapro  and Neurontin    8)Hypothyroidism----Continue Synthroid    9)DM2-A1c 6.6 reflecting good diabetic control PTA -Use Novolog /Humalog Sliding scale insulin  with Accu-Cheks/Fingersticks as ordered -Due to fluctuating oral intake, poor p.o. intake, no long-acting insulin  recommended    10)Hyperkalemia--- due to #1 above -Resolved with Lokelma       Consultants: Cardiology/nephrology/  Disposition: Skilled nursing facility Diet recommendation:  Discharge Diet Orders (From admission, onward)     Start     Ordered   10/19/23 0000  Diet Carb Modified        10/19/23 1112           Regular diet DISCHARGE MEDICATION: Allergies as of 10/19/2023        Reactions   Fenofibrate Other (See Comments)   Aches   Statins Itching   Sulfa Antibiotics Other (See Comments)   headahces        Medication List     STOP taking these medications    diltiazem  240 MG 24 hr capsule Commonly known as: CARDIZEM  CD   furosemide  20 MG tablet Commonly known as: LASIX    olmesartan  40 MG tablet Commonly known as: BENICAR    traMADol  50 MG tablet Commonly known as: ULTRAM        TAKE these medications    acetaminophen  500 MG tablet Commonly known as: TYLENOL  Take 2 tablets (1,000 mg total) by mouth every 8 (eight) hours as needed for mild pain (pain score 1-3), headache or fever.   amLODipine  10 MG tablet Commonly known as: NORVASC  Take 1 tablet (10 mg total) by mouth daily. Start taking on: October 20, 2023   carvedilol  3.125 MG tablet Commonly known as: COREG  Take 1 tablet (3.125 mg total) by mouth 2 (two) times daily.   cloNIDine  0.3 MG tablet Commonly known as: CATAPRES  Take 0.3 mg by mouth 3 (three) times daily.   escitalopram  5 MG tablet Commonly known as: LEXAPRO  Take 1 tablet (5 mg total) by mouth daily. Start taking on: October 20, 2023 What changed:  medication strength how much to take additional instructions   ferrous sulfate  325 (65 FE) MG tablet  Take 1 tablet (325 mg total) by mouth daily with breakfast. Start taking on: October 20, 2023   gabapentin  100 MG capsule Commonly known as: NEURONTIN  Take 100 mg by mouth daily.   hydrALAZINE  100 MG tablet Commonly known as: APRESOLINE  Take 1 tablet (100 mg total) by mouth 3 (three) times daily. What changed:  medication strength how much to take when to take this   insulin  aspart 100 UNIT/ML injection Commonly known as: novoLOG  Inject 0-6 Units into the skin 3 (three) times daily with meals.   isosorbide  mononitrate 60 MG 24 hr tablet Commonly known as: IMDUR  Take 1 tablet (60 mg total) by mouth daily.   levothyroxine  50 MCG tablet Commonly known as:  SYNTHROID  Take 50 mcg by mouth daily before breakfast.   megestrol  400 MG/10ML suspension Commonly known as: MEGACE  Take 10 mLs (400 mg total) by mouth daily. Start taking on: October 20, 2023   mouth rinse Liqd solution 15 mLs by Mouth Rinse route as needed (for oral care).   pantoprazole  40 MG tablet Commonly known as: PROTONIX  Take 1 tablet (40 mg total) by mouth daily.        Contact information for after-discharge care     Destination     [facility] .   Service: Skilled Nursing Contact information: [address] [place]   [address] [phone]                    Discharge Exam: Filed Weights   10/17/23 0500 10/18/23 0500 10/19/23 0540  Weight: 86.4 kg 75.7 kg 81.8 kg      General:  AAO x 3,  cooperative, no distress;   HEENT:  Normocephalic, PERRL, otherwise with in Normal limits   Neuro:  CNII-XII intact. , normal motor and sensation, reflexes intact   Lungs:   Clear to auscultation BL, Respirations unlabored,  No wheezes / crackles  Cardio:    S1/S2, RRR, No murmure, No Rubs or Gallops   Abdomen:  Soft, non-tender, bowel sounds active all four quadrants, no guarding or peritoneal signs.  Muscular  skeletal:  Limited exam -global generalized weaknesses - in bed, able to move all 4 extremities,   2+ pulses,  symmetric, No pitting edema  Skin:  Dry, warm to touch, negative for any Rashes,  Wounds: Please see nursing documentation          Condition at discharge: fair  The results of significant diagnostics from this hospitalization (including imaging, microbiology, ancillary and laboratory) are listed below for reference.   Imaging Studies: US  Venous Img Upper Uni Left (DVT) Result Date: 10/15/2023 CLINICAL DATA:  Left arm swelling EXAM: LEFT UPPER EXTREMITY VENOUS DOPPLER ULTRASOUND TECHNIQUE: Gray-scale sonography with graded compression, as well as color Doppler and duplex ultrasound were  performed to evaluate the upper extremity deep venous system from the level of the subclavian vein and including the jugular, axillary, basilic, radial, ulnar and upper cephalic vein. Spectral Doppler was utilized to evaluate flow at rest and with distal augmentation maneuvers. COMPARISON:  None Available. FINDINGS: Contralateral Subclavian Vein: Respiratory phasicity is normal and symmetric with the symptomatic side. No evidence of thrombus. Normal compressibility. Internal Jugular Vein: No evidence of thrombus. Normal compressibility, respiratory phasicity and response to augmentation. Subclavian Vein: No evidence of thrombus. Normal compressibility, respiratory phasicity and response to augmentation. Axillary Vein: No evidence of thrombus. Normal compressibility, respiratory phasicity and response to augmentation. Cephalic Vein: Thrombus is noted with decreased compressibility Basilic Vein: No  evidence of thrombus. Normal compressibility, respiratory phasicity and response to augmentation. Brachial Veins: No evidence of thrombus. Normal compressibility, respiratory phasicity and response to augmentation. Radial Veins: No evidence of thrombus. Normal compressibility, respiratory phasicity and response to augmentation. Ulnar Veins: No evidence of thrombus. Normal compressibility, respiratory phasicity and response to augmentation. Venous Reflux:  None visualized. Other Findings:  None visualized. IMPRESSION: No evidence of DVT within the left upper extremity. Superficial cephalic vein thrombus is noted. Electronically Signed   By: Oneil Devonshire M.D.   On: 10/15/2023 20:32   DG CHEST PORT 1 VIEW Result Date: 10/15/2023 CLINICAL DATA:  Shortness of breath EXAM: PORTABLE CHEST 1 VIEW COMPARISON:  10/11/2023 FINDINGS: Stable cardiomediastinal contours. Aortic atherosclerosis. Chronic coarsened interstitial markings are identified bilateral. New asymmetric opacification within the left base compatible with atelectasis  and or airspace disease. There is blunting of the left costophrenic angle which may be due to atelectasis. A pleural effusion is not excluded. IMPRESSION: 1. New asymmetric opacification within the left base compatible with atelectasis and/or airspace disease. 2. Blunting of the left costophrenic angle may be due to a small pleural effusion. 3. Diffuse chronic interstitial coarsening noted bilaterally. Electronically Signed   By: Waddell Calk M.D.   On: 10/15/2023 13:09   US  RENAL Result Date: 10/12/2023 CLINICAL DATA:  Acute kidney insufficiency EXAM: RENAL / URINARY TRACT ULTRASOUND COMPLETE COMPARISON:  Ultrasound 07/19/2020 FINDINGS: Right Kidney: Renal measurements: 10.5 x 4.6 x 4.5 cm = volume: 112.7 mL. No perinephric fluid. No collecting system dilatation. Left Kidney: Renal measurements: 8.7 x 4.1 x 4.6 cm = volume: 86.9 mL. Slight perinephric fluid. Small anechoic structure associated with kidney measures 10 mm. Simple cyst. The left kidney is echogenic. Bladder: Appears normal for degree of bladder distention. Other: Left kidney not as well seen with overlapping bowel gas and soft tissue. IMPRESSION: No collecting system dilatation.  Small echogenic left kidney. Electronically Signed   By: Ranell Bring M.D.   On: 10/12/2023 13:34   NM Pulmonary Perfusion Result Date: 10/11/2023 CLINICAL DATA:  High prob PE suspected, shortness of breath times weeks EXAM: NUCLEAR MEDICINE PERFUSION LUNG SCAN TECHNIQUE: Perfusion images were obtained in multiple projections after intravenous injection of radiopharmaceutical. Ventilation scans intentionally deferred if perfusion scan and chest x-ray adequate for interpretation during COVID 19 epidemic. RADIOPHARMACEUTICALS:  4.4 mCi Tc-73m MAA IV COMPARISON:  Portable chest radiograph from the same day FINDINGS: Heterogenous bilateral distribution of radiopharmaceutical. No discrete segmental or subsegmental perfusion defects. IMPRESSION: Low likelihood ratio for  pulmonary embolism. Electronically Signed   By: JONETTA Faes M.D.   On: 10/11/2023 13:37   DG Chest Portable 1 View Result Date: 10/11/2023 CLINICAL DATA:  Shortness of breath. EXAM: PORTABLE CHEST 1 VIEW COMPARISON:  10/01/2023 FINDINGS: The lungs are clear without focal pneumonia, edema, pneumothorax or pleural effusion. Interstitial markings are diffusely coarsened with chronic features. The cardio pericardial silhouette is enlarged. No acute bony abnormality. Telemetry leads overlie the chest. IMPRESSION: Chronic interstitial coarsening without acute cardiopulmonary findings. Electronically Signed   By: Camellia Candle M.D.   On: 10/11/2023 07:27   ECHOCARDIOGRAM COMPLETE Result Date: 10/02/2023    ECHOCARDIOGRAM REPORT   Patient Name:   LISETT DIRUSSO Date of Exam: 10/02/2023 Medical Rec #:  [id number]       Height:       65.0 in Accession #:    [id number]      Weight:       147.5 lb Date of Birth:  04/07/1936  BSA:          1.738 m Patient Age:    87 years        BP:           171/71 mmHg Patient Gender: F               HR:           59 bpm. Exam Location:  [facility] Procedure: 2D Echo, Cardiac Doppler and Color Doppler Indications:    Elevated Troponin  History:        Patient has no prior history of Echocardiogram examinations.                 Risk Factors:Hypertensive urgency. DOE p covid 19.  Sonographer:    Aida Pizza RCS Referring Phys: [phone] CARLOS MADERA IMPRESSIONS  1. Left ventricular ejection fraction, by estimation, is 65 to 70%. The left ventricle has normal function. The left ventricle has no regional wall motion abnormalities. There is moderate concentric left ventricular hypertrophy. Left ventricular diastolic parameters are indeterminate.  2. Right ventricular systolic function is normal. The right ventricular size is normal.  3. Left atrial size was mildly dilated.  4. The mitral valve is abnormal. Mild to moderate mitral valve regurgitation. Moderate mitral annular calcification.   5. The aortic valve is tricuspid. There is mild calcification of the aortic valve. Aortic valve regurgitation is not visualized. Aortic valve sclerosis/calcification is present, without any evidence of aortic stenosis.  6. The inferior vena cava is normal in size with greater than 50% respiratory variability, suggesting right atrial pressure of 3 mmHg. Comparison(s): No prior Echocardiogram. FINDINGS  Left Ventricle: Left ventricular ejection fraction, by estimation, is 65 to 70%. The left ventricle has normal function. The left ventricle has no regional wall motion abnormalities. The left ventricular internal cavity size was normal in size. There is  moderate concentric left ventricular hypertrophy. Left ventricular diastolic parameters are indeterminate. Right Ventricle: The right ventricular size is normal. No increase in right ventricular wall thickness. Right ventricular systolic function is normal. Left Atrium: Left atrial size was mildly dilated. Right Atrium: Right atrial size was not assessed. Pericardium: Trivial pericardial effusion is present. The pericardial effusion is surrounding the apex. Mitral Valve: Calcified and prolapse in the 2 chamber, central regurgitation worst in the 4 chamber. The mitral valve is abnormal. Moderate mitral annular calcification. Mild to moderate mitral valve regurgitation. Tricuspid Valve: The tricuspid valve is normal in structure. Tricuspid valve regurgitation is not demonstrated. No evidence of tricuspid stenosis. Aortic Valve: The aortic valve is tricuspid. There is mild calcification of the aortic valve. Aortic valve regurgitation is not visualized. Aortic valve sclerosis/calcification is present, without any evidence of aortic stenosis. Pulmonic Valve: The pulmonic valve was normal in structure. Pulmonic valve regurgitation is trivial. No evidence of pulmonic stenosis. Aorta: The aortic root is normal in size and structure. Venous: The inferior vena cava is normal in  size with greater than 50% respiratory variability, suggesting right atrial pressure of 3 mmHg. IAS/Shunts: The atrial septum is grossly normal.  LEFT VENTRICLE PLAX 2D LVIDd:         4.20 cm   Diastology LVIDs:         2.70 cm   LV e' medial:    2.83 cm/s LV PW:         1.30 cm   LV E/e' medial:  21.8 LV IVS:        1.30 cm   LV e'  lateral:   3.15 cm/s LVOT diam:     1.90 cm   LV E/e' lateral: 19.6 LV SV:         63 LV SV Index:   36 LVOT Area:     2.84 cm  RIGHT VENTRICLE RV S prime:     10.70 cm/s TAPSE (M-mode): 1.8 cm LEFT ATRIUM             Index        RIGHT ATRIUM           Index LA diam:        3.60 cm 2.07 cm/m   RA Area:     15.70 cm LA Vol (A2C):   73.2 ml 42.12 ml/m  RA Volume:   39.00 ml  22.44 ml/m LA Vol (A4C):   56.1 ml 32.28 ml/m LA Biplane Vol: 64.5 ml 37.11 ml/m  AORTIC VALVE LVOT Vmax:   102.00 cm/s LVOT Vmean:  66.600 cm/s LVOT VTI:    0.222 m  AORTA Ao Root diam: 3.20 cm MITRAL VALVE MV Area (PHT): 2.83 cm       SHUNTS MV Decel Time: 268 msec       Systemic VTI:  0.22 m MR Peak grad:    139.2 mmHg   Systemic Diam: 1.90 cm MR Mean grad:    86.0 mmHg MR Vmax:         590.00 cm/s MR Vmean:        425.0 cm/s MR PISA:         1.01 cm MR PISA Eff ROA: 5 mm MR PISA Radius:  0.40 cm MV E velocity: 61.70 cm/s MV A velocity: 101.00 cm/s MV E/A ratio:  0.61 Stanly Leavens MD Electronically signed by Stanly Leavens MD Signature Date/Time: 10/02/2023/2:56:39 PM    Final    CT HEAD WO CONTRAST ( ) Result Date: 10/01/2023 CLINICAL DATA:  Headache, new onset. EXAM: CT HEAD WITHOUT CONTRAST TECHNIQUE: Contiguous axial images were obtained from the base of the skull through the vertex without intravenous contrast. RADIATION DOSE REDUCTION: This exam was performed according to the departmental dose-optimization program which includes automated exposure control, adjustment of the mA and/or kV according to patient size and/or use of iterative reconstruction technique. COMPARISON:  None  Available. FINDINGS: Brain: No evidence of acute infarction, hemorrhage, hydrocephalus, extra-axial collection or mass lesion/mass effect. Preserved brain volume for age with minor white matter low-density. Intermittent superimposed streak artifact. Vascular: No hyperdense vessel or unexpected calcification. Skull: Normal. Negative for fracture or focal lesion. Sinuses/Orbits: Negative.  No sinusitis. IMPRESSION: Unremarkable head CT for age. Electronically Signed   By: Dorn Roulette M.D.   On: 10/01/2023 06:27   DG Chest Port 1 View Result Date: 10/01/2023 CLINICAL DATA:  Tachycardia and inability to rest.  Nausea EXAM: PORTABLE CHEST 1 VIEW COMPARISON:  08/13/2022 FINDINGS: Artifact from EKG leads. Normal heart size and mediastinal contours. No acute infiltrate or edema. No effusion or pneumothorax. No acute osseous findings. Generalized thoracic endplate spurring with mild scoliosis. IMPRESSION: No evidence of active disease. Electronically Signed   By: Dorn Roulette M.D.   On: 10/01/2023 05:37    Microbiology: Results for orders placed or performed during the hospital encounter of 10/11/23  Resp panel by RT-PCR (RSV, Flu A&B, Covid)     Status: None   Collection Time: 10/11/23  4:15 PM  Result Value Ref Range Status   SARS Coronavirus 2 by RT PCR NEGATIVE NEGATIVE Final    Comment: (NOTE)  SARS-CoV-2 target nucleic acids are NOT DETECTED.  The SARS-CoV-2 RNA is generally detectable in upper respiratory specimens during the acute phase of infection. The lowest concentration of SARS-CoV-2 viral copies this assay can detect is 138 copies/mL. A negative result does not preclude SARS-Cov-2 infection and should not be used as the sole basis for treatment or other patient management decisions. A negative result may occur with  improper specimen collection/handling, submission of specimen other than nasopharyngeal swab, presence of viral mutation(s) within the areas targeted by this assay, and  inadequate number of viral copies(<138 copies/mL). A negative result must be combined with clinical observations, patient history, and epidemiological information. The expected result is Negative.  Fact Sheet for Patients:  bloggercourse.com  Fact Sheet for Healthcare Providers:  seriousbroker.it  This test is no t yet approved or cleared by the [place]  FDA and  has been authorized for detection and/or diagnosis of SARS-CoV-2 by FDA under an Emergency Use Authorization (EUA). This EUA will remain  in effect (meaning this test can be used) for the duration of the COVID-19 declaration under Section 564(b)(1) of the Act, 21 U.S.C.section 360bbb-3(b)(1), unless the authorization is terminated  or revoked sooner.       Influenza A by PCR NEGATIVE NEGATIVE Final   Influenza B by PCR NEGATIVE NEGATIVE Final    Comment: (NOTE) The Xpert Xpress SARS-CoV-2/FLU/RSV plus assay is intended as an aid in the diagnosis of influenza from Nasopharyngeal swab specimens and should not be used as a sole basis for treatment. Nasal washings and aspirates are unacceptable for Xpert Xpress SARS-CoV-2/FLU/RSV testing.  Fact Sheet for Patients: bloggercourse.com  Fact Sheet for Healthcare Providers: seriousbroker.it  This test is not yet approved or cleared by the [place]  FDA and has been authorized for detection and/or diagnosis of SARS-CoV-2 by FDA under an Emergency Use Authorization (EUA). This EUA will remain in effect (meaning this test can be used) for the duration of the COVID-19 declaration under Section 564(b)(1) of the Act, 21 U.S.C. section 360bbb-3(b)(1), unless the authorization is terminated or revoked.     Resp Syncytial Virus by PCR NEGATIVE NEGATIVE Final    Comment: (NOTE) Fact Sheet for Patients: bloggercourse.com  Fact Sheet for Healthcare  Providers: seriousbroker.it  This test is not yet approved or cleared by the [place]  FDA and has been authorized for detection and/or diagnosis of SARS-CoV-2 by FDA under an Emergency Use Authorization (EUA). This EUA will remain in effect (meaning this test can be used) for the duration of the COVID-19 declaration under Section 564(b)(1) of the Act, 21 U.S.C. section 360bbb-3(b)(1), unless the authorization is terminated or revoked.  Performed at [facility], [address] [place], [place] [address]     Labs: CBC: Recent Labs  Lab 10/13/23 0410 10/15/23 0335  WBC 11.2* 9.7  HGB 8.6* 8.2*  HCT 27.0* 25.4*  MCV 90.3 88.8  PLT 266 288   Basic Metabolic Panel: Recent Labs  Lab 10/13/23 0410 10/14/23 0417 10/15/23 0335 10/15/23 1354 10/15/23 1645 10/16/23 0350 10/16/23 0720 10/16/23 1211 10/17/23 0619 10/18/23 0404 10/19/23 0430  NA 125*   < > 121* 118*   < > 123* 125* 126* 127* 129* 128*  K 4.9   < > 4.6 4.6  --  4.5  --   --  4.6 5.0 4.7  CL 94*   < > 86* 84*  --  90*  --   --  92* 95* 95*  CO2 20*   < > 26  26  --  24  --   --  26 26 27   GLUCOSE 143*   < > 119* 196*  --  199*  --   --  125* 125* 114*  BUN 64*   < > 65* 69*  --  66*  --   --  63* 59* 51*  CREATININE 2.40*   < > 2.31* 2.50*  --  2.46*  --   --  1.87* 1.81* 1.66*  CALCIUM 8.6*   < > 8.2* 8.0*  --  8.4*  --   --  8.8* 8.8* 8.9  PHOS 3.5  --  4.3  --   --   --   --   --   --   --  3.7   < > = values in this interval not displayed.   Liver Function Tests: Recent Labs  Lab 10/13/23 0410 10/15/23 0335 10/17/23 0619 10/18/23 0404 10/19/23 0430  AST  --  12* 10* 11* 11*  ALT  --  13 11 11 11   ALKPHOS  --  92 88 85 80  BILITOT  --  0.4 0.5 0.6 0.5  PROT  --  5.5* 5.6* 5.6* 5.4*  ALBUMIN  2.5* 2.3*  2.2* 2.5* 2.5* 2.5*   CBG: Recent Labs  Lab 10/18/23 0746 10/18/23 1141 10/18/23 1606 10/18/23 2112 10/19/23 0712  GLUCAP 107* 144* 115* 117* 111*     Discharge time spent: greater than 55 minutes.  Signed: Adriana DELENA Grams, MD [facility] 10/19/2023

## 2023-10-19 NOTE — Progress Notes (Signed)
 Patient ID: Sherry Waller, female   DOB: May 26, 1936, 88 y.o.   MRN: [id number] [facility] Progress Note   Assessment/ Plan:   1. Acute kidney Injury on chronic kidney disease stage IIIa (baseline creatinine 1.1-1.3): This is presumed to be hemodynamically mediated ATN from which she appears to be in the recovery phase.  She did have some hematuria and proteinuria on repeat urinalysis with negative antinuclear antibody, anti-GBM, anti-double-stranded DNA and normal complement levels.  Kappa to lambda light chain ratio within normal range and not favoring plasma cell dyscrasia.  Decent urine output overnight with some improvement of both BUN and creatinine noted.  Will continue to follow labs and clinical status closely to decide on need for diuretic supplementation. 2.  Hyponatremia: With fluctuation from hypovolemic to hypervolemic state after initial therapies.  Treated with hypertonic saline after sodium level decreased down to 118 following initial measures of management. 3.  Anemia: Concern raised for paraproteinemia with new onset anemia and hyponatremia for which she is undergoing myeloma testing (FLC's appear to be consistent with CKD without predominance). 4.  Malignant hypertension with wide pulse pressure: Wide pulse pressure suspected to be from arterial sclerosis with advanced age and blood pressures appear to be under decent control on the current regimen.  Subjective:   Reports that she is feeling much better today after a good night of sleep.  She feels mentally more clear and states that appetite is back.   Objective:   BP (!) 132/37 (BP Location: Right Arm)   Pulse (!) 50   Temp 97.8 F (36.6 C) (Oral)   Resp 20   Ht 5' 5 (1.651 m)   Wt 81.8 kg   SpO2 95%   BMI 30.01 kg/m   Intake/Output Summary (Last 24 hours) at 10/19/2023 0848 Last data filed at 10/19/2023 0540 Gross per 24 hour  Intake 240 ml  Output 1720 ml  Net -1480 ml   Weight change: 6.1  kg  Physical Exam: Gen: Comfortably resting in bed, being assisted with breakfast by son. CVS: Pulse regular bradycardia, S1 and S2 normal Resp: Decreased breath sounds over bases, no distinct rales or rhonchi Abd: Soft, flat, nontender, bowel sounds normal Ext: No lower extremity edema  Imaging: No results found.  Labs: BMET Recent Labs  Lab 10/13/23 0410 10/14/23 0417 10/15/23 0335 10/15/23 1354 10/15/23 1645 10/16/23 0010 10/16/23 0350 10/16/23 0720 10/16/23 1211 10/17/23 0619 10/18/23 0404 10/19/23 0430  NA 125* 123* 121* 118*   < > 123* 123* 125* 126* 127* 129* 128*  K 4.9 4.3 4.6 4.6  --   --  4.5  --   --  4.6 5.0 4.7  CL 94* 88* 86* 84*  --   --  90*  --   --  92* 95* 95*  CO2 20* 24 26 26   --   --  24  --   --  26 26 27   GLUCOSE 143* 162* 119* 196*  --   --  199*  --   --  125* 125* 114*  BUN 64* 63* 65* 69*  --   --  66*  --   --  63* 59* 51*  CREATININE 2.40* 2.28* 2.31* 2.50*  --   --  2.46*  --   --  1.87* 1.81* 1.66*  CALCIUM 8.6* 8.4* 8.2* 8.0*  --   --  8.4*  --   --  8.8* 8.8* 8.9  PHOS 3.5  --  4.3  --   --   --   --   --   --   --   --  3.7   < > = values in this interval not displayed.   CBC Recent Labs  Lab 10/13/23 0410 10/15/23 0335  WBC 11.2* 9.7  HGB 8.6* 8.2*  HCT 27.0* 25.4*  MCV 90.3 88.8  PLT 266 288    Medications:     amLODipine   10 mg Oral Daily   carvedilol   3.125 mg Oral BID WC   Chlorhexidine  Gluconate Cloth  6 each Topical Daily   cloNIDine   0.3 mg Oral TID   escitalopram   5 mg Oral Daily   ferrous sulfate   325 mg Oral Q breakfast   gabapentin   100 mg Oral BID   heparin   5,000 Units Subcutaneous Q8H   hydrALAZINE   100 mg Oral TID   insulin  aspart  0-5 Units Subcutaneous QHS   insulin  aspart  0-6 Units Subcutaneous TID WC   isosorbide  mononitrate  60 mg Oral Daily   levothyroxine   50 mcg Oral QAC breakfast   megestrol   400 mg Oral Daily   sodium chloride  flush  3-10 mL Intravenous Q12H    Gordy Blanch, MD 10/19/2023,  8:48 AM

## 2023-10-19 NOTE — Plan of Care (Signed)
  Problem: Health Behavior/Discharge Planning: Goal: Ability to manage health-related needs will improve Outcome: Progressing   Problem: Clinical Measurements: Goal: Ability to maintain clinical measurements within normal limits will improve Outcome: Progressing Goal: Will remain free from infection Outcome: Progressing Goal: Diagnostic test results will improve Outcome: Progressing Goal: Respiratory complications will improve Outcome: Progressing   Problem: Activity: Goal: Risk for activity intolerance will decrease Outcome: Progressing   Problem: Nutrition: Goal: Adequate nutrition will be maintained Outcome: Progressing   Problem: Elimination: Goal: Will not experience complications related to bowel motility Outcome: Progressing Goal: Will not experience complications related to urinary retention Outcome: Progressing   Problem: Pain Management: Goal: General experience of comfort will improve Outcome: Progressing   Problem: Safety: Goal: Ability to remain free from injury will improve Outcome: Progressing   Problem: Skin Integrity: Goal: Risk for impaired skin integrity will decrease Outcome: Progressing   Problem: Fluid Volume: Goal: Ability to maintain a balanced intake and output will improve Outcome: Progressing   Problem: Health Behavior/Discharge Planning: Goal: Ability to identify and utilize available resources and services will improve Outcome: Progressing Goal: Ability to manage health-related needs will improve Outcome: Progressing   Problem: Metabolic: Goal: Ability to maintain appropriate glucose levels will improve Outcome: Progressing   Problem: Skin Integrity: Goal: Risk for impaired skin integrity will decrease Outcome: Progressing

## 2023-10-20 ENCOUNTER — Inpatient Hospital Stay (HOSPITAL_COMMUNITY): Payer: Medicare HMO

## 2023-10-20 DIAGNOSIS — N179 Acute kidney failure, unspecified: Secondary | ICD-10-CM | POA: Diagnosis not present

## 2023-10-20 DIAGNOSIS — N1831 Chronic kidney disease, stage 3a: Secondary | ICD-10-CM | POA: Diagnosis not present

## 2023-10-20 DIAGNOSIS — I160 Hypertensive urgency: Secondary | ICD-10-CM

## 2023-10-20 DIAGNOSIS — J9601 Acute respiratory failure with hypoxia: Secondary | ICD-10-CM | POA: Diagnosis not present

## 2023-10-20 LAB — RENAL FUNCTION PANEL
Albumin: 2.4 g/dL — ABNORMAL LOW (ref 3.5–5.0)
Anion gap: 9 (ref 5–15)
BUN: 45 mg/dL — ABNORMAL HIGH (ref 8–23)
CO2: 25 mmol/L (ref 22–32)
Calcium: 9.1 mg/dL (ref 8.9–10.3)
Chloride: 98 mmol/L (ref 98–111)
Creatinine, Ser: 1.6 mg/dL — ABNORMAL HIGH (ref 0.44–1.00)
GFR, Estimated: 31 mL/min — ABNORMAL LOW (ref >60)
Glucose, Bld: 122 mg/dL — ABNORMAL HIGH (ref 70–99)
Phosphorus: 3.5 mg/dL (ref 2.5–4.6)
Potassium: 4.9 mmol/L (ref 3.5–5.1)
Sodium: 132 mmol/L — ABNORMAL LOW (ref 135–145)

## 2023-10-20 LAB — GLUCOSE, CAPILLARY
Glucose-Capillary: 111 mg/dL — ABNORMAL HIGH (ref 70–99)
Glucose-Capillary: 151 mg/dL — ABNORMAL HIGH (ref 70–99)
Glucose-Capillary: 108 mg/dL — ABNORMAL HIGH (ref 70–99)
Glucose-Capillary: 129 mg/dL — ABNORMAL HIGH (ref 70–99)

## 2023-10-20 NOTE — Progress Notes (Signed)
 Daily Progress Note   Patient Name: Sherry Waller       Date: 10/20/2023 DOB: 02-Aug-1936  Age: 88 y.o. MRN#: [id number] Attending Physician: Evonnie Lenis, MD Primary Care Physician: [name], MD Admit Date: 10/11/2023 Length of Stay: 8 days  Reason for Consultation/Follow-up: Establishing goals of care  HPI/Patient Profile:  88 y.o. female  with past medical history of hypothyroidism, depression, malignant HTN, DM2, CKD 3B, and GERD admitted on 10/11/2023 with AKI on CKD and uncontrolled hypertension.  She was admitted on 10/11/2023 with AKI on CKD 3B, fever of unknown origin, hyponatremia, acute anemia, malignant hypertension, concern for multiple myeloma, and others.    Palliative medicine was consulted for GOC conversations.  Subjective:   Subjective: Chart Reviewed. Updates received. Patient Assessed. Created space and opportunity for patient  and family to explore thoughts and feelings regarding current medical situation.  Today's Discussion: Today I saw the patient at the bedside. She is more awake, alert, and interactive today.  Her son Sherry Waller is present at the bedside.  We talked about her clinical improvements over the past 48 hours.  They discuss plan to discharge to [facility] nursing center for rehab, possibly in next 1 to 2 days.  We did spend some time confirming previously discussed goals of care and care limitations.  Specifically we discussed CODE STATUS and she again confirmed, along with her son, that she would not want resuscitation or intubation.  We discussed that she is agreeable to rehab.  Otherwise, she would want full scope of care.  Further decisions could be made pending her clinical status should she have clinical deterioration.  Today she denies pain in general, nausea, vomiting.  She states her appetite is improving.  Overall she feels significantly better than when she was admitted.  She is hopeful to be able to discharge soon.  I shared that given goals are clear  palliative medicine would back off for now.  However, I confirmed they had our contact information and shared that we are available if needed. I provided emotional and general support through therapeutic listening, empathy, sharing of stories, therapeutic touch, and other techniques. I answered all questions and addressed all concerns to the best of my ability.  Review of Systems  Constitutional:  Positive for fatigue.       Denies pain in general, overall feels improved  Respiratory:  Negative for chest tightness and shortness of breath.   Cardiovascular:  Negative for chest pain.  Gastrointestinal:  Negative for abdominal pain, nausea and vomiting.  Neurological:  Positive for weakness.    Objective:   Vital Signs:  BP (!) 138/46   Pulse 62   Temp 98 F (36.7 C) (Oral)   Resp 17   Ht 5' 5 (1.651 m)   Wt 81.8 kg   SpO2 95%   BMI 30.01 kg/m   Physical Exam Vitals and nursing note reviewed.  Constitutional:      General: She is not in acute distress.    Appearance: She is ill-appearing.  HENT:     Head: Normocephalic and atraumatic.  Cardiovascular:     Rate and Rhythm: Normal rate.  Pulmonary:     Effort: Pulmonary effort is normal. No respiratory distress.  Abdominal:     General: Abdomen is flat.  Skin:    General: Skin is warm and dry.  Neurological:     General: No focal deficit present.     Mental Status: She is alert.  Psychiatric:  Mood and Affect: Mood normal.        Behavior: Behavior normal.     Palliative Assessment/Data: 40-50%    Existing Vynca/ACP Documentation: None  Assessment & Plan:   Impression: Present on Admission:  Acute respiratory failure with hypoxia (HCC)  HTN (hypertension), malignant  Hypothyroidism  CKD (chronic kidney disease) stage 3B-  AKI (acute kidney injury) (HCC)  88 year old female with chronic comorbidities and acute presentation as described above. The patient seems quite frail and weak today, a bit  lethargic. However, she is oriented and interacting in conversation. She has elected DNR/DNI, family understands. We discussed her chronic comorbidities and acute presentations. We shared that there is still significant workup that is pending including multiple lab tests. We discussed the need for ongoing GOC conversations when we get further data back and have a better picture about what is going on. Overall long-term prognosis guarded to poor   SUMMARY OF RECOMMENDATIONS   DNR-Limited Full scope of care otherwise Anticipate discharge in the next 24 to 48 hours Goals are clear Palliative medicine will back off for now Please notify us  of any significant change or new palliative needs  Symptom Management:  Per primary team PMT is available to assist as needed  Code Status: DNR-limited  Prognosis: Unable to determine  Discharge Planning:  SNF  Discussed with: Patient, medical team, nursing team  Thank you for allowing us  to participate in the care of Sherry Waller PMT will continue to support holistically.  Time Total: 30 min  Detailed review of medical records (labs, imaging, vital signs), medically appropriate exam, discussed with treatment team, counseling and education to patient, family, & staff, documenting clinical information, medication management, coordination of care  Camellia Kays, NP Palliative Medicine Team  Team Phone # [phone] (Nights/Weekends)  06/10/2021, 8:17 AM

## 2023-10-20 NOTE — Hospital Course (Addendum)
 88 y.o. female with past medical history significant of hypothyroidism, depression, malignant HTN, DM2, CKD 3B, and  GERD who was recently discharged from this hospital on on 10/03/2023 after treatment for hypertensive urgency and E. coli UTI -Patient returns to the ED on 10/11/2023 with lethargy generalized weakness and hypoxia -Additional history obtained from patient's son [name] at bedside---apparently she has been so weak that she has been urinating and having BMs in bed---reports loose stools, poor appetite, nausea but no vomiting---denies blood in stool Apparently, the patient also had a productive cough with yellow sputum. In the ED, the patient was afebrile and hemodynamically stable with oxygen saturation 87% room air.  She was placed on 2 L nasal cannula with saturation 95%.  Chest x-ray showed chronic interstitial markings.  VQ scan was low probability PE.  WBC 10.0, hemoglobin 9.9, serum creatinine 2.23. During the hospitalization, the patient developed worsening hyponatremia with a nadir of 118.  She was placed on hypertonic saline for short period of time.  Nephrology was consulted to assist with management.  It was felt this was in part due to the patient's renal failure.  Further workup was undertaken for renal failure.  Her hospitalization was prolonged secondary to waiting for insurance authorization for skilled nursing facility.  Cardiology was consulted secondary to the patient's wide pulse pressures.  Was felt this is secondary to atherosclerosis.  Her blood pressure medications were adjusted.  Diltiazem  was discontinued, and the patient was started on amlodipine . Her BP overall improved and stabilized.  Her renal function gradually improved and serum creatinine was 1.44 on day of d/c.  It was felt her aKI was due to hemodynamic changes.

## 2023-10-20 NOTE — Progress Notes (Signed)
 Tele sinus brady 50s to 60s which is fine for her, can continue coreg  and clonidine  at current doses. Chronic wide pulse pressure 90-100, dinamap here often underestimated her DBP. Reasonable bp control given limitations, could consider adding alpha 1 blocker as limited other options. Can f/u with her neprhologist who has been managing her bp.  No additional cardiology recs, we will sign off inpatient care   JINNY Ross MD

## 2023-10-20 NOTE — Progress Notes (Signed)
 PROGRESS NOTE  GERARDA Waller FMW:[id number] DOB: 1936/01/14 DOA: 10/11/2023 PCP: [name], MD  Brief History:   88 y.o. female with past medical history significant of hypothyroidism, depression, malignant HTN, DM2, CKD 3B, and  GERD who was recently discharged from this hospital on on 10/03/2023 after treatment for hypertensive urgency and E. coli UTI -Patient returns to the ED on 10/11/2023 with lethargy generalized weakness and hypoxia -Additional history obtained from patient's son [name] at bedside---apparently she has been so weak that she has been urinating and having BMs in bed---reports loose stools, poor appetite, nausea but no vomiting---denies blood in stool Apparently, the patient also had a productive cough with yellow sputum. In the ED, the patient was afebrile and hemodynamically stable with oxygen saturation 87% room air.  She was placed on 2 L nasal cannula with saturation 95%.  Chest x-ray showed chronic interstitial markings.  VQ scan was low probability PE.  WBC 10.0, hemoglobin 9.9, serum creatinine 2.23. During the hospitalization, the patient developed worsening hyponatremia with a nadir of 118.  She was placed on hypertonic saline for short period of time.  Nephrology was consulted to assist with management.  It was felt this was in part due to the patient's renal failure.  Further workup was undertaken for renal failure.  Her hospitalization was prolonged secondary to waiting for insurance authorization for skilled nursing facility.  Cardiology was consulted secondary to the patient's wide pulse pressures.  Was felt this is secondary to atherosclerosis.  Her blood pressure medications were adjusted.  Diltiazem  was discontinued, and the patient was started on amlodipine .   Assessment/Plan: Acute on chronic renal failure--CKD stage IIIa -Baseline creatinine 1.1-1.3 -Serum creatinine peaked 2.52 -Felt to be secondary to hemodynamic mediated ATN -Autoimmune  serologies were negative -Kappa and lambda light chain ratio was within normal limits and not favoring plasma cell dyscrasia -Serum creatinine continues to improve -Encouraged to follow-up with outpatient nephrologist, Dr. Rachele  Hyponatremia -In part secondary to fluctuations from her hypovolemic to hypervolemic state and renal failure -Patient initially on isotonic saline -Patient required hypertonic saline for short period of time which has been discontinued -Overall improved -TSH 2.168 -A.m. cortisol 19.8  Malignant hypertension with wide pulse pressure -Appreciate cardiology evaluation -Felt to be due to arterial sclerosis with advanced age -Overall BPs are reasonably controlled -Continue amlodipine , carvedilol , clonidine , hydralazine , Imdur   Acute respiratory failure with hypoxia -VQ scan low probability PE -Chest x-ray with chronic interstitial markings -Obtain CT chest without contrast -Stable on 2 L -10/11/2023 ABG 7.4/32/61/19 -Wean oxygen as tolerated for saturation greater 92% -Incentive spirometry  Fever of unknown origin -Chest x-ray negative for consolidation -COVID-19, RSV, influenza negative -UA negative for pyuria -Overall resolved  Acute anemia -Baseline hemoglobin 13 -Hemoglobin nadir 8.6 -FOBT negative -B12--136>> replace -Folate normal -Iron saturation 8%, ferritin 127>>> started ferrous sulfate   Elevated light chains --Kappa and lambda light chain ratio was within normal limits and not favoring plasma cell dyscrasia  Hyperkalemia -Patient was treated with Lokelma   Diabetes mellitus type 2, controlled -10/01/2023 hemoglobin A1c 6.6 -Continue NovoLog  sliding scale  Hypothyroidism -Continue Synthroid   Obesity -BMI 30.01 -Lifestyle modification       Family Communication:   son updated at bedside 1/8  Consultants:  renal  Code Status:  DNR  DVT Prophylaxis:  [place] Heparin  / [place] Lovenox    Procedures: As Listed in Progress Note  Above  Antibiotics: None       Subjective:  Patient denies fevers, chills, headache, chest pain, dyspnea, nausea, vomiting, diarrhea, abdominal pain, dysuria, hematuria, hematochezia, and melena.   Objective: Vitals:   10/20/23 0508 10/20/23 0847 10/20/23 1038 10/20/23 1354  BP: (!) 163/39 (!) 157/39 (!) 138/46 (!) 131/47  Pulse: 61 62  (!) 55  Resp: 17   17  Temp: 98 F (36.7 C)     TempSrc: Oral     SpO2: 95% 95%  95%  Weight: 81.8 kg     Height:        Intake/Output Summary (Last 24 hours) at 10/20/2023 1733 Last data filed at 10/20/2023 1500 Gross per 24 hour  Intake 340 ml  Output 1700 ml  Net -1360 ml   Weight change: 0 kg Exam:  General:  Pt is alert, follows commands appropriately, not in acute distress HEENT: No icterus, No thrush, No neck mass, [place]/AT Cardiovascular: RRR, S1/S2, no rubs, no gallops Respiratory: Bibasilar rales.  No wheezing.  Air movement Abdomen: Soft/+BS, non tender, non distended, no guarding Extremities: No edema, No lymphangitis, No petechiae, No rashes, no synovitis   Data Reviewed: I have personally reviewed following labs and imaging studies Basic Metabolic Panel: Recent Labs  Lab 10/15/23 0335 10/15/23 1354 10/16/23 0350 10/16/23 0720 10/16/23 1211 10/17/23 0619 10/18/23 0404 10/19/23 0430 10/20/23 0434  NA 121*   < > 123*   < > 126* 127* 129* 128* 132*  K 4.6   < > 4.5  --   --  4.6 5.0 4.7 4.9  CL 86*   < > 90*  --   --  92* 95* 95* 98  CO2 26   < > 24  --   --  26 26 27 25   GLUCOSE 119*   < > 199*  --   --  125* 125* 114* 122*  BUN 65*   < > 66*  --   --  63* 59* 51* 45*  CREATININE 2.31*   < > 2.46*  --   --  1.87* 1.81* 1.66* 1.60*  CALCIUM 8.2*   < > 8.4*  --   --  8.8* 8.8* 8.9 9.1  PHOS 4.3  --   --   --   --   --   --  3.7 3.5   < > = values in this interval not displayed.   Liver Function Tests: Recent Labs  Lab 10/15/23 0335 10/17/23 0619 10/18/23 0404 10/19/23 0430 10/20/23 0434  AST 12* 10* 11*  11*  --   ALT 13 11 11 11   --   ALKPHOS 92 88 85 80  --   BILITOT 0.4 0.5 0.6 0.5  --   PROT 5.5* 5.6* 5.6* 5.4*  --   ALBUMIN  2.3*  2.2* 2.5* 2.5* 2.5* 2.4*   No results for input(s): LIPASE, AMYLASE in the last 168 hours. No results for input(s): AMMONIA in the last 168 hours. Coagulation Profile: No results for input(s): INR, PROTIME in the last 168 hours. CBC: Recent Labs  Lab 10/15/23 0335  WBC 9.7  HGB 8.2*  HCT 25.4*  MCV 88.8  PLT 288   Cardiac Enzymes: No results for input(s): CKTOTAL, CKMB, CKMBINDEX, TROPONINI in the last 168 hours. BNP: Invalid input(s): POCBNP CBG: Recent Labs  Lab 10/19/23 1556 10/19/23 2035 10/20/23 0723 10/20/23 1130 10/20/23 1601  GLUCAP 134* 154* 111* 151* 108*   HbA1C: No results for input(s): HGBA1C in the last 72 hours. Urine analysis:    Component Value Date/Time   COLORURINE  YELLOW 10/11/2023 0839   APPEARANCEUR HAZY (A) 10/11/2023 0839   LABSPEC 1.009 10/11/2023 0839   PHURINE 5.0 10/11/2023 0839   GLUCOSEU NEGATIVE 10/11/2023 0839   HGBUR MODERATE (A) 10/11/2023 0839   BILIRUBINUR NEGATIVE 10/11/2023 0839   KETONESUR NEGATIVE 10/11/2023 0839   PROTEINUR NEGATIVE 10/11/2023 0839   NITRITE NEGATIVE 10/11/2023 0839   LEUKOCYTESUR NEGATIVE 10/11/2023 0839   Sepsis Labs: @LABRCNTIP (procalcitonin:4,lacticidven:4) ) Recent Results (from the past 240 hours)  Resp panel by RT-PCR (RSV, Flu A&B, Covid)     Status: None   Collection Time: 10/11/23  4:15 PM  Result Value Ref Range Status   SARS Coronavirus 2 by RT PCR NEGATIVE NEGATIVE Final    Comment: (NOTE) SARS-CoV-2 target nucleic acids are NOT DETECTED.  The SARS-CoV-2 RNA is generally detectable in upper respiratory specimens during the acute phase of infection. The lowest concentration of SARS-CoV-2 viral copies this assay can detect is 138 copies/mL. A negative result does not preclude SARS-Cov-2 infection and should not be used as the  sole basis for treatment or other patient management decisions. A negative result may occur with  improper specimen collection/handling, submission of specimen other than nasopharyngeal swab, presence of viral mutation(s) within the areas targeted by this assay, and inadequate number of viral copies(<138 copies/mL). A negative result must be combined with clinical observations, patient history, and epidemiological information. The expected result is Negative.  Fact Sheet for Patients:  bloggercourse.com  Fact Sheet for Healthcare Providers:  seriousbroker.it  This test is no t yet approved or cleared by the [place]  FDA and  has been authorized for detection and/or diagnosis of SARS-CoV-2 by FDA under an Emergency Use Authorization (EUA). This EUA will remain  in effect (meaning this test can be used) for the duration of the COVID-19 declaration under Section 564(b)(1) of the Act, 21 U.S.C.section 360bbb-3(b)(1), unless the authorization is terminated  or revoked sooner.       Influenza A by PCR NEGATIVE NEGATIVE Final   Influenza B by PCR NEGATIVE NEGATIVE Final    Comment: (NOTE) The Xpert Xpress SARS-CoV-2/FLU/RSV plus assay is intended as an aid in the diagnosis of influenza from Nasopharyngeal swab specimens and should not be used as a sole basis for treatment. Nasal washings and aspirates are unacceptable for Xpert Xpress SARS-CoV-2/FLU/RSV testing.  Fact Sheet for Patients: bloggercourse.com  Fact Sheet for Healthcare Providers: seriousbroker.it  This test is not yet approved or cleared by the [place]  FDA and has been authorized for detection and/or diagnosis of SARS-CoV-2 by FDA under an Emergency Use Authorization (EUA). This EUA will remain in effect (meaning this test can be used) for the duration of the COVID-19 declaration under Section 564(b)(1) of the  Act, 21 U.S.C. section 360bbb-3(b)(1), unless the authorization is terminated or revoked.     Resp Syncytial Virus by PCR NEGATIVE NEGATIVE Final    Comment: (NOTE) Fact Sheet for Patients: bloggercourse.com  Fact Sheet for Healthcare Providers: seriousbroker.it  This test is not yet approved or cleared by the [place]  FDA and has been authorized for detection and/or diagnosis of SARS-CoV-2 by FDA under an Emergency Use Authorization (EUA). This EUA will remain in effect (meaning this test can be used) for the duration of the COVID-19 declaration under Section 564(b)(1) of the Act, 21 U.S.C. section 360bbb-3(b)(1), unless the authorization is terminated or revoked.  Performed at [facility] [address], [place], [place] [address]      Scheduled Meds:  amLODipine   10 mg Oral  Daily   carvedilol   3.125 mg Oral BID WC   cloNIDine   0.3 mg Oral TID   escitalopram   5 mg Oral Daily   ferrous sulfate   325 mg Oral Q breakfast   gabapentin   100 mg Oral BID   heparin   5,000 Units Subcutaneous Q8H   hydrALAZINE   100 mg Oral TID   insulin  aspart  0-5 Units Subcutaneous QHS   insulin  aspart  0-6 Units Subcutaneous TID WC   isosorbide  mononitrate  60 mg Oral Daily   levothyroxine   50 mcg Oral QAC breakfast   megestrol   400 mg Oral Daily   sodium chloride  flush  3-10 mL Intravenous Q12H   Continuous Infusions:  Procedures/Studies: US  Venous Img Upper Uni Left (DVT) Result Date: 10/15/2023 CLINICAL DATA:  Left arm swelling EXAM: LEFT UPPER EXTREMITY VENOUS DOPPLER ULTRASOUND TECHNIQUE: Gray-scale sonography with graded compression, as well as color Doppler and duplex ultrasound were performed to evaluate the upper extremity deep venous system from the level of the subclavian vein and including the jugular, axillary, basilic, radial, ulnar and upper cephalic vein. Spectral Doppler was utilized to evaluate flow at rest and with distal  augmentation maneuvers. COMPARISON:  None Available. FINDINGS: Contralateral Subclavian Vein: Respiratory phasicity is normal and symmetric with the symptomatic side. No evidence of thrombus. Normal compressibility. Internal Jugular Vein: No evidence of thrombus. Normal compressibility, respiratory phasicity and response to augmentation. Subclavian Vein: No evidence of thrombus. Normal compressibility, respiratory phasicity and response to augmentation. Axillary Vein: No evidence of thrombus. Normal compressibility, respiratory phasicity and response to augmentation. Cephalic Vein: Thrombus is noted with decreased compressibility Basilic Vein: No evidence of thrombus. Normal compressibility, respiratory phasicity and response to augmentation. Brachial Veins: No evidence of thrombus. Normal compressibility, respiratory phasicity and response to augmentation. Radial Veins: No evidence of thrombus. Normal compressibility, respiratory phasicity and response to augmentation. Ulnar Veins: No evidence of thrombus. Normal compressibility, respiratory phasicity and response to augmentation. Venous Reflux:  None visualized. Other Findings:  None visualized. IMPRESSION: No evidence of DVT within the left upper extremity. Superficial cephalic vein thrombus is noted. Electronically Signed   By: Oneil Devonshire M.D.   On: 10/15/2023 20:32   DG CHEST PORT 1 VIEW Result Date: 10/15/2023 CLINICAL DATA:  Shortness of breath EXAM: PORTABLE CHEST 1 VIEW COMPARISON:  10/11/2023 FINDINGS: Stable cardiomediastinal contours. Aortic atherosclerosis. Chronic coarsened interstitial markings are identified bilateral. New asymmetric opacification within the left base compatible with atelectasis and or airspace disease. There is blunting of the left costophrenic angle which may be due to atelectasis. A pleural effusion is not excluded. IMPRESSION: 1. New asymmetric opacification within the left base compatible with atelectasis and/or airspace  disease. 2. Blunting of the left costophrenic angle may be due to a small pleural effusion. 3. Diffuse chronic interstitial coarsening noted bilaterally. Electronically Signed   By: Waddell Calk M.D.   On: 10/15/2023 13:09   US  RENAL Result Date: 10/12/2023 CLINICAL DATA:  Acute kidney insufficiency EXAM: RENAL / URINARY TRACT ULTRASOUND COMPLETE COMPARISON:  Ultrasound 07/19/2020 FINDINGS: Right Kidney: Renal measurements: 10.5 x 4.6 x 4.5 cm = volume: 112.7 mL. No perinephric fluid. No collecting system dilatation. Left Kidney: Renal measurements: 8.7 x 4.1 x 4.6 cm = volume: 86.9 mL. Slight perinephric fluid. Small anechoic structure associated with kidney measures 10 mm. Simple cyst. The left kidney is echogenic. Bladder: Appears normal for degree of bladder distention. Other: Left kidney not as well seen with overlapping bowel gas and soft tissue. IMPRESSION: No collecting  system dilatation.  Small echogenic left kidney. Electronically Signed   By: Ranell Bring M.D.   On: 10/12/2023 13:34   NM Pulmonary Perfusion Result Date: 10/11/2023 CLINICAL DATA:  High prob PE suspected, shortness of breath times weeks EXAM: NUCLEAR MEDICINE PERFUSION LUNG SCAN TECHNIQUE: Perfusion images were obtained in multiple projections after intravenous injection of radiopharmaceutical. Ventilation scans intentionally deferred if perfusion scan and chest x-ray adequate for interpretation during COVID 19 epidemic. RADIOPHARMACEUTICALS:  4.4 mCi Tc-47m MAA IV COMPARISON:  Portable chest radiograph from the same day FINDINGS: Heterogenous bilateral distribution of radiopharmaceutical. No discrete segmental or subsegmental perfusion defects. IMPRESSION: Low likelihood ratio for pulmonary embolism. Electronically Signed   By: JONETTA Faes M.D.   On: 10/11/2023 13:37   DG Chest Portable 1 View Result Date: 10/11/2023 CLINICAL DATA:  Shortness of breath. EXAM: PORTABLE CHEST 1 VIEW COMPARISON:  10/01/2023 FINDINGS: The lungs  are clear without focal pneumonia, edema, pneumothorax or pleural effusion. Interstitial markings are diffusely coarsened with chronic features. The cardio pericardial silhouette is enlarged. No acute bony abnormality. Telemetry leads overlie the chest. IMPRESSION: Chronic interstitial coarsening without acute cardiopulmonary findings. Electronically Signed   By: Camellia Candle M.D.   On: 10/11/2023 07:27   ECHOCARDIOGRAM COMPLETE Result Date: 10/02/2023    ECHOCARDIOGRAM REPORT   Patient Name:   Sherry Waller Date of Exam: 10/02/2023 Medical Rec #:  [id number]       Height:       65.0 in Accession #:    [id number]      Weight:       147.5 lb Date of Birth:  January 04, 1936       BSA:          1.738 m Patient Age:    87 years        BP:           171/71 mmHg Patient Gender: F               HR:           59 bpm. Exam Location:  [facility] Procedure: 2D Echo, Cardiac Doppler and Color Doppler Indications:    Elevated Troponin  History:        Patient has no prior history of Echocardiogram examinations.                 Risk Factors:Hypertensive urgency. DOE p covid 19.  Sonographer:    Aida Pizza RCS Referring Phys: [id number] CARLOS MADERA IMPRESSIONS  1. Left ventricular ejection fraction, by estimation, is 65 to 70%. The left ventricle has normal function. The left ventricle has no regional wall motion abnormalities. There is moderate concentric left ventricular hypertrophy. Left ventricular diastolic parameters are indeterminate.  2. Right ventricular systolic function is normal. The right ventricular size is normal.  3. Left atrial size was mildly dilated.  4. The mitral valve is abnormal. Mild to moderate mitral valve regurgitation. Moderate mitral annular calcification.  5. The aortic valve is tricuspid. There is mild calcification of the aortic valve. Aortic valve regurgitation is not visualized. Aortic valve sclerosis/calcification is present, without any evidence of aortic stenosis.  6. The inferior vena cava is  normal in size with greater than 50% respiratory variability, suggesting right atrial pressure of 3 mmHg. Comparison(s): No prior Echocardiogram. FINDINGS  Left Ventricle: Left ventricular ejection fraction, by estimation, is 65 to 70%. The left ventricle has normal function. The left ventricle has no regional wall motion abnormalities. The left ventricular internal  cavity size was normal in size. There is  moderate concentric left ventricular hypertrophy. Left ventricular diastolic parameters are indeterminate. Right Ventricle: The right ventricular size is normal. No increase in right ventricular wall thickness. Right ventricular systolic function is normal. Left Atrium: Left atrial size was mildly dilated. Right Atrium: Right atrial size was not assessed. Pericardium: Trivial pericardial effusion is present. The pericardial effusion is surrounding the apex. Mitral Valve: Calcified and prolapse in the 2 chamber, central regurgitation worst in the 4 chamber. The mitral valve is abnormal. Moderate mitral annular calcification. Mild to moderate mitral valve regurgitation. Tricuspid Valve: The tricuspid valve is normal in structure. Tricuspid valve regurgitation is not demonstrated. No evidence of tricuspid stenosis. Aortic Valve: The aortic valve is tricuspid. There is mild calcification of the aortic valve. Aortic valve regurgitation is not visualized. Aortic valve sclerosis/calcification is present, without any evidence of aortic stenosis. Pulmonic Valve: The pulmonic valve was normal in structure. Pulmonic valve regurgitation is trivial. No evidence of pulmonic stenosis. Aorta: The aortic root is normal in size and structure. Venous: The inferior vena cava is normal in size with greater than 50% respiratory variability, suggesting right atrial pressure of 3 mmHg. IAS/Shunts: The atrial septum is grossly normal.  LEFT VENTRICLE PLAX 2D LVIDd:         4.20 cm   Diastology LVIDs:         2.70 cm   LV e' medial:     2.83 cm/s LV PW:         1.30 cm   LV E/e' medial:  21.8 LV IVS:        1.30 cm   LV e' lateral:   3.15 cm/s LVOT diam:     1.90 cm   LV E/e' lateral: 19.6 LV SV:         63 LV SV Index:   36 LVOT Area:     2.84 cm  RIGHT VENTRICLE RV S prime:     10.70 cm/s TAPSE (M-mode): 1.8 cm LEFT ATRIUM             Index        RIGHT ATRIUM           Index Sherry diam:        3.60 cm 2.07 cm/m   RA Area:     15.70 cm Sherry Vol (A2C):   73.2 ml 42.12 ml/m  RA Volume:   39.00 ml  22.44 ml/m Sherry Vol (A4C):   56.1 ml 32.28 ml/m Sherry Biplane Vol: 64.5 ml 37.11 ml/m  AORTIC VALVE LVOT Vmax:   102.00 cm/s LVOT Vmean:  66.600 cm/s LVOT VTI:    0.222 m  AORTA Ao Root diam: 3.20 cm MITRAL VALVE MV Area (PHT): 2.83 cm       SHUNTS MV Decel Time: 268 msec       Systemic VTI:  0.22 m MR Peak grad:    139.2 mmHg   Systemic Diam: 1.90 cm MR Mean grad:    86.0 mmHg MR Vmax:         590.00 cm/s MR Vmean:        425.0 cm/s MR PISA:         1.01 cm MR PISA Eff ROA: 5 mm MR PISA Radius:  0.40 cm MV E velocity: 61.70 cm/s MV A velocity: 101.00 cm/s MV E/A ratio:  0.61 Stanly Leavens MD Electronically signed by Stanly Leavens MD Signature Date/Time: 10/02/2023/2:56:39 PM    Final  CT HEAD WO CONTRAST ( ) Result Date: 10/01/2023 CLINICAL DATA:  Headache, new onset. EXAM: CT HEAD WITHOUT CONTRAST TECHNIQUE: Contiguous axial images were obtained from the base of the skull through the vertex without intravenous contrast. RADIATION DOSE REDUCTION: This exam was performed according to the departmental dose-optimization program which includes automated exposure control, adjustment of the mA and/or kV according to patient size and/or use of iterative reconstruction technique. COMPARISON:  None Available. FINDINGS: Brain: No evidence of acute infarction, hemorrhage, hydrocephalus, extra-axial collection or mass lesion/mass effect. Preserved brain volume for age with minor white matter low-density. Intermittent superimposed streak  artifact. Vascular: No hyperdense vessel or unexpected calcification. Skull: Normal. Negative for fracture or focal lesion. Sinuses/Orbits: Negative.  No sinusitis. IMPRESSION: Unremarkable head CT for age. Electronically Signed   By: Dorn Roulette M.D.   On: 10/01/2023 06:27   DG Chest Port 1 View Result Date: 10/01/2023 CLINICAL DATA:  Tachycardia and inability to rest.  Nausea EXAM: PORTABLE CHEST 1 VIEW COMPARISON:  08/13/2022 FINDINGS: Artifact from EKG leads. Normal heart size and mediastinal contours. No acute infiltrate or edema. No effusion or pneumothorax. No acute osseous findings. Generalized thoracic endplate spurring with mild scoliosis. IMPRESSION: No evidence of active disease. Electronically Signed   By: Dorn Roulette M.D.   On: 10/01/2023 05:37    Alm Schneider, DO  [facility]  If 7PM-7AM, please contact night-coverage [email or web address] Password [facility] 10/20/2023, 5:33 PM   LOS: 8 days

## 2023-10-20 NOTE — Progress Notes (Signed)
 Mobility Specialist Progress Note:    10/20/23 0950  Mobility  Activity Ambulated with assistance in room  Level of Assistance Minimal assist, patient does 75% or more  Assistive Device Front wheel walker  Distance Ambulated (ft) 14 ft  Range of Motion/Exercises Active;All extremities  Activity Response Tolerated well  Mobility Referral Yes  Mobility visit 1 Mobility  Mobility Specialist Start Time (ACUTE ONLY) 0935  Mobility Specialist Stop Time (ACUTE ONLY) 0955  Mobility Specialist Time Calculation (min) (ACUTE ONLY) 20 min   Pt received in bed, agreeable to mobility. Required MinA to stand and ambulate with RW. Tolerated well, c/o weakness. Returned to bed, alarm on. Son at bedside, all needs met.   Sherrilee Ditty Mobility Specialist Please contact via Special Educational Needs Teacher or  Rehab office at [phone]

## 2023-10-20 NOTE — Plan of Care (Signed)
   Problem: Nutrition: Goal: Adequate nutrition will be maintained Outcome: Not Progressing

## 2023-10-20 NOTE — Progress Notes (Signed)
 Patient ID: Sherry Waller, female   DOB: 09/30/1936, 88 y.o.   MRN: [id number] [facility] Progress Note   Assessment/ Plan:   1. Acute kidney Injury on chronic kidney disease stage IIIa (baseline creatinine 1.1-1.3): Nonoliguric and is presumed to be hemodynamically mediated ATN with labs showing gradual renal recovery.  Serologies done to evaluate for etiology of hematuria/proteinuria have been negative so far.  Kappa to lambda light chain ratio within normal range and not favoring plasma cell dyscrasia. Encouraged to follow-up with Dr. Rachele as an outpatient upon discharge. 2.  Hyponatremia: With fluctuation from hypovolemic to hypervolemic state after initial therapies prompting need for transient use of hypertonic saline.  Sodium level improving with renal recovery/auto diuresis. 3.  Anemia: Concern raised for paraproteinemia with new onset anemia and hyponatremia for which she is undergoing myeloma testing (FLC's appear to be consistent with CKD without predominance). 4.  Malignant hypertension with wide pulse pressure: Wide pulse pressure suspected to be from arterial sclerosis with advanced age and blood pressures appear to be under decent control on the current regimen.  Subjective:   Reports that she had a good day yesterday with an uneventful night.  Plans noted for possible discharge home.   Objective:   BP (!) 163/39 (BP Location: Right Arm)   Pulse 61   Temp 98 F (36.7 C) (Oral)   Resp 17   Ht 5' 5 (1.651 m)   Wt 81.8 kg   SpO2 95%   BMI 30.01 kg/m   Intake/Output Summary (Last 24 hours) at 10/20/2023 0835 Last data filed at 10/20/2023 9491 Gross per 24 hour  Intake 600 ml  Output 1200 ml  Net -600 ml   Weight change: 0 kg  Physical Exam: Gen: Comfortably resting in bed, awakens easily to conversation and engages CVS: Pulse regular bradycardia, S1 and S2 normal Resp: Decreased breath sounds over bases, no distinct rales or rhonchi Abd: Soft, flat,  nontender, bowel sounds normal Ext: No lower extremity edema  Imaging: No results found.  Labs: BMET Recent Labs  Lab 10/15/23 0335 10/15/23 1354 10/15/23 1645 10/16/23 0350 10/16/23 0720 10/16/23 1211 10/17/23 0619 10/18/23 0404 10/19/23 0430 10/20/23 0434  NA 121* 118*   < > 123* 125* 126* 127* 129* 128* 132*  K 4.6 4.6  --  4.5  --   --  4.6 5.0 4.7 4.9  CL 86* 84*  --  90*  --   --  92* 95* 95* 98  CO2 26 26  --  24  --   --  26 26 27 25   GLUCOSE 119* 196*  --  199*  --   --  125* 125* 114* 122*  BUN 65* 69*  --  66*  --   --  63* 59* 51* 45*  CREATININE 2.31* 2.50*  --  2.46*  --   --  1.87* 1.81* 1.66* 1.60*  CALCIUM 8.2* 8.0*  --  8.4*  --   --  8.8* 8.8* 8.9 9.1  PHOS 4.3  --   --   --   --   --   --   --  3.7 3.5   < > = values in this interval not displayed.   CBC Recent Labs  Lab 10/15/23 0335  WBC 9.7  HGB 8.2*  HCT 25.4*  MCV 88.8  PLT 288    Medications:     amLODipine   10 mg Oral Daily   carvedilol   3.125 mg Oral BID WC  cloNIDine   0.3 mg Oral TID   escitalopram   5 mg Oral Daily   ferrous sulfate   325 mg Oral Q breakfast   gabapentin   100 mg Oral BID   heparin   5,000 Units Subcutaneous Q8H   hydrALAZINE   100 mg Oral TID   insulin  aspart  0-5 Units Subcutaneous QHS   insulin  aspart  0-6 Units Subcutaneous TID WC   isosorbide  mononitrate  60 mg Oral Daily   levothyroxine   50 mcg Oral QAC breakfast   megestrol   400 mg Oral Daily   sodium chloride  flush  3-10 mL Intravenous Q12H    Gordy Blanch, MD 10/20/2023, 8:35 AM

## 2023-10-21 DIAGNOSIS — N179 Acute kidney failure, unspecified: Secondary | ICD-10-CM | POA: Diagnosis not present

## 2023-10-21 DIAGNOSIS — N1831 Chronic kidney disease, stage 3a: Secondary | ICD-10-CM | POA: Diagnosis not present

## 2023-10-21 DIAGNOSIS — J690 Pneumonitis due to inhalation of food and vomit: Secondary | ICD-10-CM | POA: Diagnosis not present

## 2023-10-21 DIAGNOSIS — J9601 Acute respiratory failure with hypoxia: Secondary | ICD-10-CM | POA: Diagnosis not present

## 2023-10-21 LAB — RENAL FUNCTION PANEL
Albumin: 2.5 g/dL — ABNORMAL LOW (ref 3.5–5.0)
Anion gap: 9 (ref 5–15)
BUN: 40 mg/dL — ABNORMAL HIGH (ref 8–23)
CO2: 24 mmol/L (ref 22–32)
Calcium: 9.1 mg/dL (ref 8.9–10.3)
Chloride: 98 mmol/L (ref 98–111)
Creatinine, Ser: 1.67 mg/dL — ABNORMAL HIGH (ref 0.44–1.00)
GFR, Estimated: 30 mL/min — ABNORMAL LOW (ref >60)
Glucose, Bld: 113 mg/dL — ABNORMAL HIGH (ref 70–99)
Phosphorus: 3.6 mg/dL (ref 2.5–4.6)
Potassium: 4.9 mmol/L (ref 3.5–5.1)
Sodium: 131 mmol/L — ABNORMAL LOW (ref 135–145)

## 2023-10-21 LAB — GLUCOSE, CAPILLARY
Glucose-Capillary: 114 mg/dL — ABNORMAL HIGH (ref 70–99)
Glucose-Capillary: 135 mg/dL — ABNORMAL HIGH (ref 70–99)
Glucose-Capillary: 188 mg/dL — ABNORMAL HIGH (ref 70–99)
Glucose-Capillary: 164 mg/dL — ABNORMAL HIGH (ref 70–99)

## 2023-10-21 MED ORDER — CLONIDINE HCL 0.2 MG PO TABS
0.3000 mg | ORAL_TABLET | Freq: Two times a day (BID) | ORAL | Status: DC
  Administered 2023-10-22 – 2023-10-23 (×3): 0.3 mg via ORAL
  Filled 2023-10-21 (×3): qty 1

## 2023-10-21 MED ORDER — HYDRALAZINE HCL 25 MG PO TABS
25.0000 mg | ORAL_TABLET | Freq: Three times a day (TID) | ORAL | Status: DC
Start: 2023-10-21 — End: 2023-10-23
  Administered 2023-10-21 – 2023-10-23 (×5): 25 mg via ORAL
  Filled 2023-10-21 (×5): qty 1

## 2023-10-21 MED ORDER — ALBUMIN HUMAN 25 % IV SOLN
12.5000 g | Freq: Once | INTRAVENOUS | Status: AC
  Administered 2023-10-21: 12.5 g via INTRAVENOUS
  Filled 2023-10-21: qty 50

## 2023-10-21 MED ORDER — SODIUM CHLORIDE 0.9 % IV SOLN
3.0000 g | Freq: Two times a day (BID) | INTRAVENOUS | Status: DC
  Administered 2023-10-21 – 2023-10-22 (×3): 3 g via INTRAVENOUS
  Filled 2023-10-21 (×3): qty 8

## 2023-10-21 MED ORDER — FUROSEMIDE 10 MG/ML IJ SOLN
40.0000 mg | Freq: Once | INTRAMUSCULAR | Status: AC
  Administered 2023-10-21: 40 mg via INTRAVENOUS
  Filled 2023-10-21: qty 4

## 2023-10-21 NOTE — Plan of Care (Signed)
 Rested well during the night with son at bedside. Oxygen sats WNL while on 2l oxygen via nasal cannula.  Problem: Health Behavior/Discharge Planning: Goal: Ability to manage health-related needs will improve Outcome: Progressing   Problem: Clinical Measurements: Goal: Ability to maintain clinical measurements within normal limits will improve Outcome: Progressing Goal: Will remain free from infection Outcome: Progressing Goal: Diagnostic test results will improve Outcome: Progressing Goal: Respiratory complications will improve Outcome: Progressing   Problem: Activity: Goal: Risk for activity intolerance will decrease Outcome: Progressing   Problem: Nutrition: Goal: Adequate nutrition will be maintained Outcome: Progressing   Problem: Elimination: Goal: Will not experience complications related to bowel motility Outcome: Progressing Goal: Will not experience complications related to urinary retention Outcome: Progressing   Problem: Pain Management: Goal: General experience of comfort will improve Outcome: Progressing   Problem: Safety: Goal: Ability to remain free from injury will improve Outcome: Progressing   Problem: Skin Integrity: Goal: Risk for impaired skin integrity will decrease Outcome: Progressing   Problem: Fluid Volume: Goal: Ability to maintain a balanced intake and output will improve Outcome: Progressing   Problem: Health Behavior/Discharge Planning: Goal: Ability to identify and utilize available resources and services will improve Outcome: Progressing Goal: Ability to manage health-related needs will improve Outcome: Progressing   Problem: Metabolic: Goal: Ability to maintain appropriate glucose levels will improve Outcome: Progressing   Problem: Skin Integrity: Goal: Risk for impaired skin integrity will decrease Outcome: Progressing

## 2023-10-21 NOTE — Progress Notes (Signed)
   10/21/23 1100  Spiritual Encounters  Type of Visit Initial  Care provided to: Family  OnCall Visit No   Chaplain visited with family member as patient was resting. Their hope is that she be able to go to [facility] today for rehabilitation. The patient, Sherry Waller was left very week from her illness. Her son shared she can walk but tires quickly.   Carley Tylor Chaplain  [facility]  [phone]

## 2023-10-21 NOTE — Progress Notes (Signed)
 Mobility Specialist Progress Note:    10/21/23 1005  Mobility  Activity Transferred from bed to chair;Stood at bedside;Dangled on edge of bed  Level of Assistance Moderate assist, patient does 50-74%  Assistive Device None  Distance Ambulated (ft) 6 ft  Range of Motion/Exercises Active;All extremities  Activity Response Tolerated well  Mobility Referral Yes  Mobility visit 1 Mobility  Mobility Specialist Start Time (ACUTE ONLY) 0950  Mobility Specialist Stop Time (ACUTE ONLY) 1005  Mobility Specialist Time Calculation (min) (ACUTE ONLY) 15 min   Pt received in bed, agreeable to mobility. Required ModA to stand and ambulate with no AD. Tolerated well, c/o weakness. Left pt in chair, NT in room. All needs met.   Sherrilee Ditty Mobility Specialist Please contact via Special Educational Needs Teacher or  Rehab office at [phone]

## 2023-10-21 NOTE — Progress Notes (Signed)
 PROGRESS NOTE  Sherry Waller FMW:[id number] DOB: 1936-02-12 DOA: 10/11/2023 PCP: [name], MD  Brief History:   88 y.o. female with past medical history significant of hypothyroidism, depression, malignant HTN, DM2, CKD 3B, and  GERD who was recently discharged from this hospital on on 10/03/2023 after treatment for hypertensive urgency and E. coli UTI -Patient returns to the ED on 10/11/2023 with lethargy generalized weakness and hypoxia -Additional history obtained from patient's son [name] at bedside---apparently she has been so weak that she has been urinating and having BMs in bed---reports loose stools, poor appetite, nausea but no vomiting---denies blood in stool Apparently, the patient also had a productive cough with yellow sputum. In the ED, the patient was afebrile and hemodynamically stable with oxygen saturation 87% room air.  She was placed on 2 L nasal cannula with saturation 95%.  Chest x-ray showed chronic interstitial markings.  VQ scan was low probability PE.  WBC 10.0, hemoglobin 9.9, serum creatinine 2.23. During the hospitalization, the patient developed worsening hyponatremia with a nadir of 118.  She was placed on hypertonic saline for short period of time.  Nephrology was consulted to assist with management.  It was felt this was in part due to the patient's renal failure.  Further workup was undertaken for renal failure.  Her hospitalization was prolonged secondary to waiting for insurance authorization for skilled nursing facility.  Cardiology was consulted secondary to the patient's wide pulse pressures.  Was felt this is secondary to atherosclerosis.  Her blood pressure medications were adjusted.  Diltiazem  was discontinued, and the patient was started on amlodipine .   Assessment/Plan: Acute on chronic renal failure--CKD stage IIIa -Baseline creatinine 1.1-1.3 -Serum creatinine peaked 2.52 -Felt to be secondary to hemodynamic mediated ATN -Autoimmune  serologies were negative -Kappa and lambda light chain ratio was within normal limits and not favoring plasma cell dyscrasia -Serum creatinine continues to improve -Encouraged to follow-up with outpatient nephrologist, Dr. Rachele   Hyponatremia -In part secondary to fluctuations from her hypovolemic to hypervolemic state and renal failure -Patient initially on isotonic saline -Patient required hypertonic saline for short period of time which has been discontinued -Overall improved -TSH 2.168 -A.m. cortisol 19.8 -1/9--discussed with Dr. Gordy Blanch   Malignant hypertension with wide pulse pressure -Appreciate cardiology evaluation -Felt to be due to arterial sclerosis with advanced age -Overall BPs are reasonably controlled -Continue amlodipine , carvedilol , clonidine , hydralazine , Imdur    Acute respiratory failure with hypoxia -VQ scan low probability PE -Chest x-ray with chronic interstitial markings -1/9 CT chest without contrast--sm-mod pleural effusion; UL nodular opacities; esophagus filled with foold and debris -Stable on 2 L -10/11/2023 ABG 7.4/32/61/19 -Wean oxygen as tolerated for saturation greater 92% -Incentive spirometry -lasix  IV x 1   FeverAspiration pneumonitis -due to aspiration pneumonitis -COVID-19, RSV, influenza negative -UA negative for pyuria -start Unasyn  -speech therapy eval   Acute anemia -Baseline hemoglobin 13 -Hemoglobin nadir 8.6 -FOBT negative -B12--136>> replace -Folate normal -Iron saturation 8%, ferritin 127>>> started ferrous sulfate    Elevated light chains --Kappa and lambda light chain ratio was within normal limits and not favoring plasma cell dyscrasia   Hyperkalemia -Patient was treated with Lokelma    Diabetes mellitus type 2, controlled -10/01/2023 hemoglobin A1c 6.6 -Continue NovoLog  sliding scale   Hypothyroidism -Continue Synthroid    Obesity -BMI 30.01 -Lifestyle modification    Family Communication:   son  updated at bedside 1/8   Consultants:  renal   Code Status:  DNR   DVT Prophylaxis:  [place] Heparin  /  Lovenox      Procedures: As Listed in Progress Note Above   Antibiotics: None      Subjective: Patient denies fevers, chills, headache, chest pain, dyspnea, nausea, vomiting, diarrhea, abdominal pain, dysuria, hematuria, hematochezia, and melena.   Objective: Vitals:   10/20/23 2159 10/21/23 0435 10/21/23 0500 10/21/23 1354  BP: (!) 140/52 111/76  (!) 120/42  Pulse:  (!) 55  (!) 54  Resp:  18  16  Temp:  98.7 F (37.1 C)    TempSrc:  Oral    SpO2:  96%  95%  Weight:   77.5 kg   Height:        Intake/Output Summary (Last 24 hours) at 10/21/2023 1731 Last data filed at 10/21/2023 1300 Gross per 24 hour  Intake 540 ml  Output 1200 ml  Net -660 ml   Weight change: -4.3 kg Exam:  General:  Pt is alert, follows commands appropriately, not in acute distress HEENT: No icterus, No thrush, No neck mass, [place]/AT Cardiovascular: RRR, S1/S2, no rubs, no gallops Respiratory: bibasilar rales.  No wheeze Abdomen: Soft/+BS, non tender, non distended, no guarding Extremities: No edema, No lymphangitis, No petechiae, No rashes, no synovitis   Data Reviewed: I have personally reviewed following labs and imaging studies Basic Metabolic Panel: Recent Labs  Lab 10/15/23 0335 10/15/23 1354 10/17/23 0619 10/18/23 0404 10/19/23 0430 10/20/23 0434 10/21/23 0431  NA 121*   < > 127* 129* 128* 132* 131*  K 4.6   < > 4.6 5.0 4.7 4.9 4.9  CL 86*   < > 92* 95* 95* 98 98  CO2 26   < > 26 26 27 25 24   GLUCOSE 119*   < > 125* 125* 114* 122* 113*  BUN 65*   < > 63* 59* 51* 45* 40*  CREATININE 2.31*   < > 1.87* 1.81* 1.66* 1.60* 1.67*  CALCIUM 8.2*   < > 8.8* 8.8* 8.9 9.1 9.1  PHOS 4.3  --   --   --  3.7 3.5 3.6   < > = values in this interval not displayed.   Liver Function Tests: Recent Labs  Lab 10/15/23 0335 10/17/23 0619 10/18/23 0404 10/19/23 0430 10/20/23 0434  10/21/23 0431  AST 12* 10* 11* 11*  --   --   ALT 13 11 11 11   --   --   ALKPHOS 92 88 85 80  --   --   BILITOT 0.4 0.5 0.6 0.5  --   --   PROT 5.5* 5.6* 5.6* 5.4*  --   --   ALBUMIN  2.3*  2.2* 2.5* 2.5* 2.5* 2.4* 2.5*   No results for input(s): LIPASE, AMYLASE in the last 168 hours. No results for input(s): AMMONIA in the last 168 hours. Coagulation Profile: No results for input(s): INR, PROTIME in the last 168 hours. CBC: Recent Labs  Lab 10/15/23 0335  WBC 9.7  HGB 8.2*  HCT 25.4*  MCV 88.8  PLT 288   Cardiac Enzymes: No results for input(s): CKTOTAL, CKMB, CKMBINDEX, TROPONINI in the last 168 hours. BNP: Invalid input(s): POCBNP CBG: Recent Labs  Lab 10/20/23 1601 10/20/23 2114 10/21/23 0757 10/21/23 1134 10/21/23 1647  GLUCAP 108* 129* 114* 135* 188*   HbA1C: No results for input(s): HGBA1C in the last 72 hours. Urine analysis:    Component Value Date/Time   COLORURINE YELLOW 10/11/2023 0839   APPEARANCEUR HAZY (A) 10/11/2023 0839   LABSPEC  1.009 10/11/2023 0839   PHURINE 5.0 10/11/2023 0839   GLUCOSEU NEGATIVE 10/11/2023 0839   HGBUR MODERATE (A) 10/11/2023 0839   BILIRUBINUR NEGATIVE 10/11/2023 0839   KETONESUR NEGATIVE 10/11/2023 0839   PROTEINUR NEGATIVE 10/11/2023 0839   NITRITE NEGATIVE 10/11/2023 0839   LEUKOCYTESUR NEGATIVE 10/11/2023 0839   Sepsis Labs: @LABRCNTIP (procalcitonin:4,lacticidven:4) )No results found for this or any previous visit (from the past 240 hours).   Scheduled Meds:  amLODipine   10 mg Oral Daily   carvedilol   3.125 mg Oral BID WC   [START ON 10/22/2023] cloNIDine   0.3 mg Oral BID   escitalopram   5 mg Oral Daily   ferrous sulfate   325 mg Oral Q breakfast   gabapentin   100 mg Oral BID   heparin   5,000 Units Subcutaneous Q8H   hydrALAZINE   25 mg Oral TID   insulin  aspart  0-5 Units Subcutaneous QHS   insulin  aspart  0-6 Units Subcutaneous TID WC   isosorbide  mononitrate  60 mg Oral Daily    levothyroxine   50 mcg Oral QAC breakfast   megestrol   400 mg Oral Daily   sodium chloride  flush  3-10 mL Intravenous Q12H   Continuous Infusions:  Procedures/Studies: CT CHEST WO CONTRAST Result Date: 10/20/2023 CLINICAL DATA:  Hypoxia, cough, shortness of breath EXAM: CT CHEST WITHOUT CONTRAST TECHNIQUE: Multidetector CT imaging of the chest was performed following the standard protocol without IV contrast. RADIATION DOSE REDUCTION: This exam was performed according to the departmental dose-optimization program which includes automated exposure control, adjustment of the mA and/or kV according to patient size and/or use of iterative reconstruction technique. COMPARISON:  None Available. FINDINGS: Cardiovascular: Heart is normal size. Aorta is normal caliber. Scattered coronary artery and aortic atherosclerosis. Mediastinum/Nodes: Mildly enlarged scattered mediastinal lymph nodes. Right paratracheal lymph node has a short axis diameter of 11 mm. Other similarly sized paratracheal, AP window and subcarinal lymph nodes. No axillary or visible hilar adenopathy. Trachea and thyroid  unremarkable. Esophagus is filled with fluid/debris. Lungs/Pleura: Small to moderate bilateral pleural effusions. Compressive atelectasis in the lower lobes. Patchy ground-glass opacities in the upper lobes, likely infectious/inflammatory. Upper Abdomen: No acute findings Musculoskeletal: Chest wall soft tissues are unremarkable. No acute bony abnormality. IMPRESSION: Small to moderate bilateral pleural effusions. Compressive atelectasis in the lower lobes. Patchy ground-glass nodular opacities in the upper lobes, likely infectious/inflammatory. These could be followed after treatment to ensure resolution. Mildly enlarged mediastinal lymph nodes, likely reactive. Coronary artery disease. Esophagus is filled with food debris and fluid a could be related to reflux or dysmotility. Aortic Atherosclerosis (ICD10-I70.0). Electronically  Signed   By: Franky Crease M.D.   On: 10/20/2023 22:07   US  Venous Img Upper Uni Left (DVT) Result Date: 10/15/2023 CLINICAL DATA:  Left arm swelling EXAM: LEFT UPPER EXTREMITY VENOUS DOPPLER ULTRASOUND TECHNIQUE: Gray-scale sonography with graded compression, as well as color Doppler and duplex ultrasound were performed to evaluate the upper extremity deep venous system from the level of the subclavian vein and including the jugular, axillary, basilic, radial, ulnar and upper cephalic vein. Spectral Doppler was utilized to evaluate flow at rest and with distal augmentation maneuvers. COMPARISON:  None Available. FINDINGS: Contralateral Subclavian Vein: Respiratory phasicity is normal and symmetric with the symptomatic side. No evidence of thrombus. Normal compressibility. Internal Jugular Vein: No evidence of thrombus. Normal compressibility, respiratory phasicity and response to augmentation. Subclavian Vein: No evidence of thrombus. Normal compressibility, respiratory phasicity and response to augmentation. Axillary Vein: No evidence of thrombus. Normal compressibility, respiratory phasicity and  response to augmentation. Cephalic Vein: Thrombus is noted with decreased compressibility Basilic Vein: No evidence of thrombus. Normal compressibility, respiratory phasicity and response to augmentation. Brachial Veins: No evidence of thrombus. Normal compressibility, respiratory phasicity and response to augmentation. Radial Veins: No evidence of thrombus. Normal compressibility, respiratory phasicity and response to augmentation. Ulnar Veins: No evidence of thrombus. Normal compressibility, respiratory phasicity and response to augmentation. Venous Reflux:  None visualized. Other Findings:  None visualized. IMPRESSION: No evidence of DVT within the left upper extremity. Superficial cephalic vein thrombus is noted. Electronically Signed   By: Oneil Devonshire M.D.   On: 10/15/2023 20:32   DG CHEST PORT 1 VIEW Result  Date: 10/15/2023 CLINICAL DATA:  Shortness of breath EXAM: PORTABLE CHEST 1 VIEW COMPARISON:  10/11/2023 FINDINGS: Stable cardiomediastinal contours. Aortic atherosclerosis. Chronic coarsened interstitial markings are identified bilateral. New asymmetric opacification within the left base compatible with atelectasis and or airspace disease. There is blunting of the left costophrenic angle which may be due to atelectasis. A pleural effusion is not excluded. IMPRESSION: 1. New asymmetric opacification within the left base compatible with atelectasis and/or airspace disease. 2. Blunting of the left costophrenic angle may be due to a small pleural effusion. 3. Diffuse chronic interstitial coarsening noted bilaterally. Electronically Signed   By: Waddell Calk M.D.   On: 10/15/2023 13:09   US  RENAL Result Date: 10/12/2023 CLINICAL DATA:  Acute kidney insufficiency EXAM: RENAL / URINARY TRACT ULTRASOUND COMPLETE COMPARISON:  Ultrasound 07/19/2020 FINDINGS: Right Kidney: Renal measurements: 10.5 x 4.6 x 4.5 cm = volume: 112.7 mL. No perinephric fluid. No collecting system dilatation. Left Kidney: Renal measurements: 8.7 x 4.1 x 4.6 cm = volume: 86.9 mL. Slight perinephric fluid. Small anechoic structure associated with kidney measures 10 mm. Simple cyst. The left kidney is echogenic. Bladder: Appears normal for degree of bladder distention. Other: Left kidney not as well seen with overlapping bowel gas and soft tissue. IMPRESSION: No collecting system dilatation.  Small echogenic left kidney. Electronically Signed   By: Ranell Bring M.D.   On: 10/12/2023 13:34   NM Pulmonary Perfusion Result Date: 10/11/2023 CLINICAL DATA:  High prob PE suspected, shortness of breath times weeks EXAM: NUCLEAR MEDICINE PERFUSION LUNG SCAN TECHNIQUE: Perfusion images were obtained in multiple projections after intravenous injection of radiopharmaceutical. Ventilation scans intentionally deferred if perfusion scan and chest x-ray  adequate for interpretation during COVID 19 epidemic. RADIOPHARMACEUTICALS:  4.4 mCi Tc-16m MAA IV COMPARISON:  Portable chest radiograph from the same day FINDINGS: Heterogenous bilateral distribution of radiopharmaceutical. No discrete segmental or subsegmental perfusion defects. IMPRESSION: Low likelihood ratio for pulmonary embolism. Electronically Signed   By: JONETTA Faes M.D.   On: 10/11/2023 13:37   DG Chest Portable 1 View Result Date: 10/11/2023 CLINICAL DATA:  Shortness of breath. EXAM: PORTABLE CHEST 1 VIEW COMPARISON:  10/01/2023 FINDINGS: The lungs are clear without focal pneumonia, edema, pneumothorax or pleural effusion. Interstitial markings are diffusely coarsened with chronic features. The cardio pericardial silhouette is enlarged. No acute bony abnormality. Telemetry leads overlie the chest. IMPRESSION: Chronic interstitial coarsening without acute cardiopulmonary findings. Electronically Signed   By: Camellia Candle M.D.   On: 10/11/2023 07:27   ECHOCARDIOGRAM COMPLETE Result Date: 10/02/2023    ECHOCARDIOGRAM REPORT   Patient Name:   KIALA FARAJ Date of Exam: 10/02/2023 Medical Rec #:  [id number]       Height:       65.0 in Accession #:    [id number]  Weight:       147.5 lb Date of Birth:  1936/01/26       BSA:          1.738 m Patient Age:    87 years        BP:           171/71 mmHg Patient Gender: F               HR:           59 bpm. Exam Location:  [facility] Procedure: 2D Echo, Cardiac Doppler and Color Doppler Indications:    Elevated Troponin  History:        Patient has no prior history of Echocardiogram examinations.                 Risk Factors:Hypertensive urgency. DOE p covid 19.  Sonographer:    Aida Pizza RCS Referring Phys: [phone] CARLOS MADERA IMPRESSIONS  1. Left ventricular ejection fraction, by estimation, is 65 to 70%. The left ventricle has normal function. The left ventricle has no regional wall motion abnormalities. There is moderate concentric left  ventricular hypertrophy. Left ventricular diastolic parameters are indeterminate.  2. Right ventricular systolic function is normal. The right ventricular size is normal.  3. Left atrial size was mildly dilated.  4. The mitral valve is abnormal. Mild to moderate mitral valve regurgitation. Moderate mitral annular calcification.  5. The aortic valve is tricuspid. There is mild calcification of the aortic valve. Aortic valve regurgitation is not visualized. Aortic valve sclerosis/calcification is present, without any evidence of aortic stenosis.  6. The inferior vena cava is normal in size with greater than 50% respiratory variability, suggesting right atrial pressure of 3 mmHg. Comparison(s): No prior Echocardiogram. FINDINGS  Left Ventricle: Left ventricular ejection fraction, by estimation, is 65 to 70%. The left ventricle has normal function. The left ventricle has no regional wall motion abnormalities. The left ventricular internal cavity size was normal in size. There is  moderate concentric left ventricular hypertrophy. Left ventricular diastolic parameters are indeterminate. Right Ventricle: The right ventricular size is normal. No increase in right ventricular wall thickness. Right ventricular systolic function is normal. Left Atrium: Left atrial size was mildly dilated. Right Atrium: Right atrial size was not assessed. Pericardium: Trivial pericardial effusion is present. The pericardial effusion is surrounding the apex. Mitral Valve: Calcified and prolapse in the 2 chamber, central regurgitation worst in the 4 chamber. The mitral valve is abnormal. Moderate mitral annular calcification. Mild to moderate mitral valve regurgitation. Tricuspid Valve: The tricuspid valve is normal in structure. Tricuspid valve regurgitation is not demonstrated. No evidence of tricuspid stenosis. Aortic Valve: The aortic valve is tricuspid. There is mild calcification of the aortic valve. Aortic valve regurgitation is not  visualized. Aortic valve sclerosis/calcification is present, without any evidence of aortic stenosis. Pulmonic Valve: The pulmonic valve was normal in structure. Pulmonic valve regurgitation is trivial. No evidence of pulmonic stenosis. Aorta: The aortic root is normal in size and structure. Venous: The inferior vena cava is normal in size with greater than 50% respiratory variability, suggesting right atrial pressure of 3 mmHg. IAS/Shunts: The atrial septum is grossly normal.  LEFT VENTRICLE PLAX 2D LVIDd:         4.20 cm   Diastology LVIDs:         2.70 cm   LV e' medial:    2.83 cm/s LV PW:         1.30 cm  LV E/e' medial:  21.8 LV IVS:        1.30 cm   LV e' lateral:   3.15 cm/s LVOT diam:     1.90 cm   LV E/e' lateral: 19.6 LV SV:         63 LV SV Index:   36 LVOT Area:     2.84 cm  RIGHT VENTRICLE RV S prime:     10.70 cm/s TAPSE (M-mode): 1.8 cm LEFT ATRIUM             Index        RIGHT ATRIUM           Index LA diam:        3.60 cm 2.07 cm/m   RA Area:     15.70 cm LA Vol (A2C):   73.2 ml 42.12 ml/m  RA Volume:   39.00 ml  22.44 ml/m LA Vol (A4C):   56.1 ml 32.28 ml/m LA Biplane Vol: 64.5 ml 37.11 ml/m  AORTIC VALVE LVOT Vmax:   102.00 cm/s LVOT Vmean:  66.600 cm/s LVOT VTI:    0.222 m  AORTA Ao Root diam: 3.20 cm MITRAL VALVE MV Area (PHT): 2.83 cm       SHUNTS MV Decel Time: 268 msec       Systemic VTI:  0.22 m MR Peak grad:    139.2 mmHg   Systemic Diam: 1.90 cm MR Mean grad:    86.0 mmHg MR Vmax:         590.00 cm/s MR Vmean:        425.0 cm/s MR PISA:         1.01 cm MR PISA Eff ROA: 5 mm MR PISA Radius:  0.40 cm MV E velocity: 61.70 cm/s MV A velocity: 101.00 cm/s MV E/A ratio:  0.61 Stanly Leavens MD Electronically signed by Stanly Leavens MD Signature Date/Time: 10/02/2023/2:56:39 PM    Final    CT HEAD WO CONTRAST ( ) Result Date: 10/01/2023 CLINICAL DATA:  Headache, new onset. EXAM: CT HEAD WITHOUT CONTRAST TECHNIQUE: Contiguous axial images were obtained from the base  of the skull through the vertex without intravenous contrast. RADIATION DOSE REDUCTION: This exam was performed according to the departmental dose-optimization program which includes automated exposure control, adjustment of the mA and/or kV according to patient size and/or use of iterative reconstruction technique. COMPARISON:  None Available. FINDINGS: Brain: No evidence of acute infarction, hemorrhage, hydrocephalus, extra-axial collection or mass lesion/mass effect. Preserved brain volume for age with minor white matter low-density. Intermittent superimposed streak artifact. Vascular: No hyperdense vessel or unexpected calcification. Skull: Normal. Negative for fracture or focal lesion. Sinuses/Orbits: Negative.  No sinusitis. IMPRESSION: Unremarkable head CT for age. Electronically Signed   By: Dorn Roulette M.D.   On: 10/01/2023 06:27   DG Chest Port 1 View Result Date: 10/01/2023 CLINICAL DATA:  Tachycardia and inability to rest.  Nausea EXAM: PORTABLE CHEST 1 VIEW COMPARISON:  08/13/2022 FINDINGS: Artifact from EKG leads. Normal heart size and mediastinal contours. No acute infiltrate or edema. No effusion or pneumothorax. No acute osseous findings. Generalized thoracic endplate spurring with mild scoliosis. IMPRESSION: No evidence of active disease. Electronically Signed   By: Dorn Roulette M.D.   On: 10/01/2023 05:37    Alm Schneider, DO  [facility]  If 7PM-7AM, please contact night-coverage [email or web address] Password [email or web address] 10/21/2023, 5:31 PM   LOS: 9 days

## 2023-10-21 NOTE — Progress Notes (Addendum)
 BP was 120/42 pulse 45.  Dr. Evonnie ordered to hold apresoline , clonidine  but to give coreg  and OK to let tele order DC. Sat in chair for long time today after assisted to chair with mobility tech.  Did well with standby. Complained of chronic left ankle pain.  Received tylenol  and heat pack.  Fed self and ate well. Son was at bedside until afternoon.  Purwick not working this afternoon.  Pads and gown wet.

## 2023-10-21 NOTE — Progress Notes (Addendum)
 Patient ID: Sherry Waller, female   DOB: 06-16-1936, 88 y.o.   MRN: [id number] [facility] Progress Note   Assessment/ Plan:   1. Acute kidney Injury on chronic kidney disease stage IIIa (baseline creatinine 1.1-1.3): Nonoliguric and is presumed to be hemodynamically mediated ATN with labs essentially unchanged renal function overnight-I suspect that she will have a slow recovery and may not entirely get back to her previous baseline (this can be monitored and managed as an outpatient by Dr. Rachele upon discharge).  Serologies done to evaluate for etiology of hematuria/proteinuria have been negative so far.  Kappa to lambda light chain ratio within normal range and not favoring plasma cell dyscrasia.  Nephrology will sign off at this time and remain available for questions or concerns.  On physical exam, she appears to be euvolemic with oxygen dependency and previous imaging suggestive of pulmonary vascular congestion, will give single dose IV Lasix  40 mg. 2.  Hyponatremia: With fluctuation from hypovolemic to hypervolemic state after initial therapies prompting need for transient use of hypertonic saline.  Sodium level relatively stable overnight, anticipate to improve with renal recovery/regaining ability for water handling. 3.  Anemia: Concern raised for paraproteinemia with new onset anemia and hyponatremia for which she is undergoing myeloma testing (FLC's appear to be consistent with CKD without predominance). 4.  Malignant hypertension with wide pulse pressure: Wide pulse pressure suspected to be from arterial sclerosis with advanced age and blood pressures appear to be under decent control on the current regimen.  Subjective:   Denies any acute events overnight, palliative care appropriately following.   Objective:   BP 111/76 (BP Location: Right Arm)   Pulse (!) 55   Temp 98.7 F (37.1 C) (Oral)   Resp 18   Ht 5' 5 (1.651 m)   Wt 77.5 kg   SpO2 96%   BMI 28.43 kg/m    Intake/Output Summary (Last 24 hours) at 10/21/2023 0902 Last data filed at 10/21/2023 0152 Gross per 24 hour  Intake 120 ml  Output 1300 ml  Net -1180 ml   Weight change: -4.3 kg  Physical Exam: Gen: Comfortably sitting propped up in bed, being assisted with breakfast by her son CVS: Pulse regular bradycardia, S1 and S2 normal Resp: Decreased breath sounds over bases, no distinct rales or rhonchi Abd: Soft, flat, nontender, bowel sounds normal Ext: No lower extremity edema  Imaging: CT CHEST WO CONTRAST Result Date: 10/20/2023 CLINICAL DATA:  Hypoxia, cough, shortness of breath EXAM: CT CHEST WITHOUT CONTRAST TECHNIQUE: Multidetector CT imaging of the chest was performed following the standard protocol without IV contrast. RADIATION DOSE REDUCTION: This exam was performed according to the departmental dose-optimization program which includes automated exposure control, adjustment of the mA and/or kV according to patient size and/or use of iterative reconstruction technique. COMPARISON:  None Available. FINDINGS: Cardiovascular: Heart is normal size. Aorta is normal caliber. Scattered coronary artery and aortic atherosclerosis. Mediastinum/Nodes: Mildly enlarged scattered mediastinal lymph nodes. Right paratracheal lymph node has a short axis diameter of 11 mm. Other similarly sized paratracheal, AP window and subcarinal lymph nodes. No axillary or visible hilar adenopathy. Trachea and thyroid  unremarkable. Esophagus is filled with fluid/debris. Lungs/Pleura: Small to moderate bilateral pleural effusions. Compressive atelectasis in the lower lobes. Patchy ground-glass opacities in the upper lobes, likely infectious/inflammatory. Upper Abdomen: No acute findings Musculoskeletal: Chest wall soft tissues are unremarkable. No acute bony abnormality. IMPRESSION: Small to moderate bilateral pleural effusions. Compressive atelectasis in the lower lobes. Patchy ground-glass nodular opacities  in the upper  lobes, likely infectious/inflammatory. These could be followed after treatment to ensure resolution. Mildly enlarged mediastinal lymph nodes, likely reactive. Coronary artery disease. Esophagus is filled with food debris and fluid a could be related to reflux or dysmotility. Aortic Atherosclerosis (ICD10-I70.0). Electronically Signed   By: Franky Crease M.D.   On: 10/20/2023 22:07    Labs: BMET Recent Labs  Lab 10/15/23 0335 10/15/23 1354 10/15/23 1645 10/16/23 0350 10/16/23 0720 10/16/23 1211 10/17/23 9380 10/18/23 0404 10/19/23 0430 10/20/23 0434 10/21/23 0431  NA 121* 118*   < > 123* 125* 126* 127* 129* 128* 132* 131*  K 4.6 4.6  --  4.5  --   --  4.6 5.0 4.7 4.9 4.9  CL 86* 84*  --  90*  --   --  92* 95* 95* 98 98  CO2 26 26  --  24  --   --  26 26 27 25 24   GLUCOSE 119* 196*  --  199*  --   --  125* 125* 114* 122* 113*  BUN 65* 69*  --  66*  --   --  63* 59* 51* 45* 40*  CREATININE 2.31* 2.50*  --  2.46*  --   --  1.87* 1.81* 1.66* 1.60* 1.67*  CALCIUM 8.2* 8.0*  --  8.4*  --   --  8.8* 8.8* 8.9 9.1 9.1  PHOS 4.3  --   --   --   --   --   --   --  3.7 3.5 3.6   < > = values in this interval not displayed.   CBC Recent Labs  Lab 10/15/23 0335  WBC 9.7  HGB 8.2*  HCT 25.4*  MCV 88.8  PLT 288    Medications:     amLODipine   10 mg Oral Daily   carvedilol   3.125 mg Oral BID WC   cloNIDine   0.3 mg Oral TID   escitalopram   5 mg Oral Daily   ferrous sulfate   325 mg Oral Q breakfast   gabapentin   100 mg Oral BID   heparin   5,000 Units Subcutaneous Q8H   hydrALAZINE   100 mg Oral TID   insulin  aspart  0-5 Units Subcutaneous QHS   insulin  aspart  0-6 Units Subcutaneous TID WC   isosorbide  mononitrate  60 mg Oral Daily   levothyroxine   50 mcg Oral QAC breakfast   megestrol   400 mg Oral Daily   sodium chloride  flush  3-10 mL Intravenous Q12H    Gordy Blanch, MD 10/21/2023, 9:02 AM

## 2023-10-22 DIAGNOSIS — N1831 Chronic kidney disease, stage 3a: Secondary | ICD-10-CM | POA: Diagnosis not present

## 2023-10-22 DIAGNOSIS — J9601 Acute respiratory failure with hypoxia: Secondary | ICD-10-CM | POA: Diagnosis not present

## 2023-10-22 DIAGNOSIS — I160 Hypertensive urgency: Secondary | ICD-10-CM | POA: Diagnosis not present

## 2023-10-22 DIAGNOSIS — N179 Acute kidney failure, unspecified: Secondary | ICD-10-CM | POA: Diagnosis not present

## 2023-10-22 LAB — CBC
HCT: 26.5 % — ABNORMAL LOW (ref 36.0–46.0)
Hemoglobin: 8.2 g/dL — ABNORMAL LOW (ref 12.0–15.0)
MCH: 28.4 pg (ref 26.0–34.0)
MCHC: 30.9 g/dL (ref 30.0–36.0)
MCV: 91.7 fL (ref 80.0–100.0)
Platelets: 294 10*3/uL (ref 150–400)
RBC: 2.89 MIL/uL — ABNORMAL LOW (ref 3.87–5.11)
RDW: 14 % (ref 11.5–15.5)
WBC: 6 10*3/uL (ref 4.0–10.5)
nRBC: 0 % (ref 0.0–0.2)

## 2023-10-22 LAB — RENAL FUNCTION PANEL
Albumin: 2.7 g/dL — ABNORMAL LOW (ref 3.5–5.0)
Anion gap: 7 (ref 5–15)
BUN: 40 mg/dL — ABNORMAL HIGH (ref 8–23)
CO2: 25 mmol/L (ref 22–32)
Calcium: 9.1 mg/dL (ref 8.9–10.3)
Chloride: 98 mmol/L (ref 98–111)
Creatinine, Ser: 1.82 mg/dL — ABNORMAL HIGH (ref 0.44–1.00)
GFR, Estimated: 27 mL/min — ABNORMAL LOW (ref >60)
Glucose, Bld: 128 mg/dL — ABNORMAL HIGH (ref 70–99)
Phosphorus: 4.4 mg/dL (ref 2.5–4.6)
Potassium: 4.4 mmol/L (ref 3.5–5.1)
Sodium: 130 mmol/L — ABNORMAL LOW (ref 135–145)

## 2023-10-22 LAB — GLUCOSE, CAPILLARY
Glucose-Capillary: 122 mg/dL — ABNORMAL HIGH (ref 70–99)
Glucose-Capillary: 122 mg/dL — ABNORMAL HIGH (ref 70–99)
Glucose-Capillary: 151 mg/dL — ABNORMAL HIGH (ref 70–99)
Glucose-Capillary: 100 mg/dL — ABNORMAL HIGH (ref 70–99)

## 2023-10-22 NOTE — Evaluation (Signed)
 Clinical/Bedside Swallow Evaluation Patient Details  Name: Sherry Waller MRN: [id number] Date of Birth: 01-22-1936  Today's Date: 10/22/2023 Time: SLP Start Time (ACUTE ONLY): 0844 SLP Stop Time (ACUTE ONLY): 0857 SLP Time Calculation (min) (ACUTE ONLY): 13 min  Past Medical History:  Past Medical History:  Diagnosis Date   Hypertension    Thyroid  disease    Past Surgical History:  Past Surgical History:  Procedure Laterality Date   ABDOMINAL HYSTERECTOMY     CHOLECYSTECTOMY     TONSILLECTOMY     HPI:  88 y.o. female with past medical history significant of hypothyroidism, depression, malignant HTN, DM2, CKD 3B, and  GERD who was recently discharged from this hospital on on 10/03/2023 after treatment for hypertensive urgency and E. coli UTI. BSE requested   Assessment / Plan / Recommendation  Clinical Impression  Clinical swallowing evaluation completed while Pt was sitting upright in bed. Pt self fed all textures and consistencies on regular tray without overt s/sx of oropharyngeal dysphagia. Pt denies dysphagia. SLP reviewed universal aspiration precautions with Pt. Recommend continue with regular diet and thin liquids. There are no further ST neeeds noted at this time, our service will sign off.  Thank you for this referral SLP Visit Diagnosis: Dysphagia, unspecified (R13.10)    Aspiration Risk  Mild aspiration risk    Diet Recommendation Regular;Thin liquid    Liquid Administration via: Straw;Cup Medication Administration: Whole meds with liquid Supervision: Patient able to self feed Postural Changes: Seated upright at 90 degrees    Other  Recommendations Oral Care Recommendations: Oral care BID    Recommendations for follow up therapy are one component of a multi-disciplinary discharge planning process, led by the attending physician.  Recommendations may be updated based on patient status, additional functional criteria and insurance authorization.  Follow up  Recommendations No SLP follow up        Swallow Study   General Date of Onset: 10/11/23 HPI: 88 y.o. female with past medical history significant of hypothyroidism, depression, malignant HTN, DM2, CKD 3B, and  GERD who was recently discharged from this hospital on on 10/03/2023 after treatment for hypertensive urgency and E. coli UTI Type of Study: Bedside Swallow Evaluation Previous Swallow Assessment: none in chart Diet Prior to this Study: Regular;Thin liquids (Level 0) Temperature Spikes Noted: No Respiratory Status: Nasal cannula History of Recent Intubation: No Behavior/Cognition: Alert;Cooperative;Pleasant mood Oral Cavity Assessment: Within Functional Limits Oral Care Completed by SLP: Recent completion by staff Oral Cavity - Dentition: Adequate natural dentition Vision: Functional for self-feeding Self-Feeding Abilities: Able to feed self Patient Positioning: Upright in bed Baseline Vocal Quality: Normal Volitional Cough: Strong Volitional Swallow: Able to elicit    Oral/Motor/Sensory Function Overall Oral Motor/Sensory Function: Within functional limits   Ice Chips Ice chips: Not tested   Thin Liquid Thin Liquid: Within functional limits    Nectar Thick Nectar Thick Liquid: Not tested   Honey Thick Honey Thick Liquid: Not tested   Puree Puree: Within functional limits   Solid     Solid: Within functional limits     [name], CCC-SLP Speech Language Pathologist  [name] 10/22/2023,8:58 AM

## 2023-10-22 NOTE — Plan of Care (Signed)
  Problem: Health Behavior/Discharge Planning: Goal: Ability to manage health-related needs will improve Outcome: Progressing   Problem: Clinical Measurements: Goal: Ability to maintain clinical measurements within normal limits will improve Outcome: Progressing Goal: Will remain free from infection Outcome: Progressing Goal: Diagnostic test results will improve Outcome: Progressing Goal: Respiratory complications will improve Outcome: Progressing   Problem: Activity: Goal: Risk for activity intolerance will decrease Outcome: Progressing   Problem: Nutrition: Goal: Adequate nutrition will be maintained Outcome: Progressing   Problem: Elimination: Goal: Will not experience complications related to bowel motility Outcome: Progressing Goal: Will not experience complications related to urinary retention Outcome: Progressing   Problem: Pain Management: Goal: General experience of comfort will improve Outcome: Progressing   Problem: Safety: Goal: Ability to remain free from injury will improve Outcome: Progressing   Problem: Skin Integrity: Goal: Risk for impaired skin integrity will decrease Outcome: Progressing   Problem: Fluid Volume: Goal: Ability to maintain a balanced intake and output will improve Outcome: Progressing   Problem: Health Behavior/Discharge Planning: Goal: Ability to identify and utilize available resources and services will improve Outcome: Progressing Goal: Ability to manage health-related needs will improve Outcome: Progressing   Problem: Metabolic: Goal: Ability to maintain appropriate glucose levels will improve Outcome: Progressing   Problem: Skin Integrity: Goal: Risk for impaired skin integrity will decrease Outcome: Progressing

## 2023-10-22 NOTE — Progress Notes (Signed)
 PROGRESS NOTE  Sherry Waller FMW:[id number] DOB: April 09, 1936 DOA: 10/11/2023 PCP: [name], MD  Brief History:   88 y.o. female with past medical history significant of hypothyroidism, depression, malignant HTN, DM2, CKD 3B, and  GERD who was recently discharged from this hospital on on 10/03/2023 after treatment for hypertensive urgency and E. coli UTI -Patient returns to the ED on 10/11/2023 with lethargy generalized weakness and hypoxia -Additional history obtained from patient's son [name] at bedside---apparently she has been so weak that she has been urinating and having BMs in bed---reports loose stools, poor appetite, nausea but no vomiting---denies blood in stool Apparently, the patient also had a productive cough with yellow sputum. In the ED, the patient was afebrile and hemodynamically stable with oxygen saturation 87% room air.  She was placed on 2 L nasal cannula with saturation 95%.  Chest x-ray showed chronic interstitial markings.  VQ scan was low probability PE.  WBC 10.0, hemoglobin 9.9, serum creatinine 2.23. During the hospitalization, the patient developed worsening hyponatremia with a nadir of 118.  She was placed on hypertonic saline for short period of time.  Nephrology was consulted to assist with management.  It was felt this was in part due to the patient's renal failure.  Further workup was undertaken for renal failure.  Her hospitalization was prolonged secondary to waiting for insurance authorization for skilled nursing facility.  Cardiology was consulted secondary to the patient's wide pulse pressures.  Was felt this is secondary to atherosclerosis.  Her blood pressure medications were adjusted.  Diltiazem  was discontinued, and the patient was started on amlodipine .   Assessment/Plan: Acute on chronic renal failure--CKD stage IIIa -Baseline creatinine 1.1-1.3 -Serum creatinine peaked 2.52 -Felt to be secondary to hemodynamic mediated ATN -Autoimmune  serologies were negative -Kappa and lambda light chain ratio was within normal limits and not favoring plasma cell dyscrasia -Serum creatinine continues to improve -Encouraged to follow-up with outpatient nephrologist, Dr. Rachele   Hyponatremia -In part secondary to fluctuations from her hypovolemic to hypervolemic state and renal failure -Patient initially on isotonic saline -Patient required hypertonic saline for short period of time which has been discontinued -Overall improved -TSH 2.168 -A.m. cortisol 19.8 -1/9--discussed with Dr. Gordy Blanch   Malignant hypertension with wide pulse pressure -Appreciate cardiology evaluation -Felt to be due to arterial sclerosis with advanced age -Overall BPs are reasonably controlled -Continue amlodipine , carvedilol , clonidine , hydralazine , Imdur    Acute respiratory failure with hypoxia -VQ scan low probability PE -Chest x-ray with chronic interstitial markings -1/9 CT chest without contrast--sm-mod pleural effusion; UL nodular opacities; esophagus filled with foold and debris -Stable on 2 L -10/11/2023 ABG 7.4/32/61/19 -Wean oxygen as tolerated for saturation greater 92% -Incentive spirometry -1/9 lasix  IV x 1   FeverAspiration pneumonitis -due to aspiration pneumonitis -COVID-19, RSV, influenza negative -UA negative for pyuria -start Unasyn  1/9 -speech therapy eval   Acute anemia -Baseline hemoglobin 13 -Hemoglobin nadir 8.6 -FOBT negative -B12--136>> replace -Folate normal -Iron saturation 8%, ferritin 127>>> started ferrous sulfate    Elevated light chains --Kappa and lambda light chain ratio was within normal limits and not favoring plasma cell dyscrasia   Hyperkalemia -Patient was treated with Lokelma    Diabetes mellitus type 2, controlled -10/01/2023 hemoglobin A1c 6.6 -Continue NovoLog  sliding scale   Hypothyroidism -Continue Synthroid    Obesity -BMI 30.01 -Lifestyle modification     Family Communication:    son updated at bedside 1/8   Consultants:  renal  Code Status:  DNR   DVT Prophylaxis:  [place] Heparin  / [place] Lovenox      Procedures: As Listed in Progress Note Above   Antibiotics: Unasyn  1/9>>        Subjective: Patient denies fevers, chills, headache, chest pain, dyspnea, nausea, vomiting, diarrhea, abdominal pain, dysuria, hematuria, hematochezia, and melena.   Objective: Vitals:   10/22/23 0429 10/22/23 0434 10/22/23 0931 10/22/23 1424  BP:  (!) 159/47 (!) 169/42 (!) 121/40  Pulse:  66 70 60  Resp:  20  17  Temp:  98.8 F (37.1 C)  98.5 F (36.9 C)  TempSrc:  Oral    SpO2:  96%  97%  Weight: 79.5 kg     Height:        Intake/Output Summary (Last 24 hours) at 10/22/2023 1737 Last data filed at 10/22/2023 1500 Gross per 24 hour  Intake 557.89 ml  Output 1100 ml  Net -542.11 ml   Weight change: 2 kg Exam:  General:  Pt is alert, follows commands appropriately, not in acute distress HEENT: No icterus, No thrush, No neck mass, [place]/AT Cardiovascular: RRR, S1/S2, no rubs, no gallops Respiratory: bibasilar rales.  No wheeze Abdomen: Soft/+BS, non tender, non distended, no guarding Extremities: No edema, No lymphangitis, No petechiae, No rashes, no synovitis   Data Reviewed: I have personally reviewed following labs and imaging studies Basic Metabolic Panel: Recent Labs  Lab 10/18/23 0404 10/19/23 0430 10/20/23 0434 10/21/23 0431 10/22/23 0415  NA 129* 128* 132* 131* 130*  K 5.0 4.7 4.9 4.9 4.4  CL 95* 95* 98 98 98  CO2 26 27 25 24 25   GLUCOSE 125* 114* 122* 113* 128*  BUN 59* 51* 45* 40* 40*  CREATININE 1.81* 1.66* 1.60* 1.67* 1.82*  CALCIUM 8.8* 8.9 9.1 9.1 9.1  PHOS  --  3.7 3.5 3.6 4.4   Liver Function Tests: Recent Labs  Lab 10/17/23 0619 10/18/23 0404 10/19/23 0430 10/20/23 0434 10/21/23 0431 10/22/23 0415  AST 10* 11* 11*  --   --   --   ALT 11 11 11   --   --   --   ALKPHOS 88 85 80  --   --   --   BILITOT 0.5 0.6 0.5  --   --   --    PROT 5.6* 5.6* 5.4*  --   --   --   ALBUMIN  2.5* 2.5* 2.5* 2.4* 2.5* 2.7*   No results for input(s): LIPASE, AMYLASE in the last 168 hours. No results for input(s): AMMONIA in the last 168 hours. Coagulation Profile: No results for input(s): INR, PROTIME in the last 168 hours. CBC: Recent Labs  Lab 10/22/23 0415  WBC 6.0  HGB 8.2*  HCT 26.5*  MCV 91.7  PLT 294   Cardiac Enzymes: No results for input(s): CKTOTAL, CKMB, CKMBINDEX, TROPONINI in the last 168 hours. BNP: Invalid input(s): POCBNP CBG: Recent Labs  Lab 10/21/23 1647 10/21/23 2119 10/22/23 0816 10/22/23 1146 10/22/23 1626  GLUCAP 188* 164* 122* 122* 151*   HbA1C: No results for input(s): HGBA1C in the last 72 hours. Urine analysis:    Component Value Date/Time   COLORURINE YELLOW 10/11/2023 0839   APPEARANCEUR HAZY (A) 10/11/2023 0839   LABSPEC 1.009 10/11/2023 0839   PHURINE 5.0 10/11/2023 0839   GLUCOSEU NEGATIVE 10/11/2023 0839   HGBUR MODERATE (A) 10/11/2023 0839   BILIRUBINUR NEGATIVE 10/11/2023 0839   KETONESUR NEGATIVE 10/11/2023 0839   PROTEINUR NEGATIVE 10/11/2023 0839   NITRITE NEGATIVE 10/11/2023  9160   LEUKOCYTESUR NEGATIVE 10/11/2023 0839   Sepsis Labs: @LABRCNTIP (procalcitonin:4,lacticidven:4) )No results found for this or any previous visit (from the past 240 hours).   Scheduled Meds:  amLODipine   10 mg Oral Daily   carvedilol   3.125 mg Oral BID WC   cloNIDine   0.3 mg Oral BID   escitalopram   5 mg Oral Daily   ferrous sulfate   325 mg Oral Q breakfast   gabapentin   100 mg Oral BID   heparin   5,000 Units Subcutaneous Q8H   hydrALAZINE   25 mg Oral TID   insulin  aspart  0-5 Units Subcutaneous QHS   insulin  aspart  0-6 Units Subcutaneous TID WC   isosorbide  mononitrate  60 mg Oral Daily   levothyroxine   50 mcg Oral QAC breakfast   megestrol   400 mg Oral Daily   sodium chloride  flush  3-10 mL Intravenous Q12H   Continuous Infusions:  ampicillin -sulbactam  (UNASYN ) IV 3 g (10/22/23 0951)    Procedures/Studies: CT CHEST WO CONTRAST Result Date: 10/20/2023 CLINICAL DATA:  Hypoxia, cough, shortness of breath EXAM: CT CHEST WITHOUT CONTRAST TECHNIQUE: Multidetector CT imaging of the chest was performed following the standard protocol without IV contrast. RADIATION DOSE REDUCTION: This exam was performed according to the departmental dose-optimization program which includes automated exposure control, adjustment of the mA and/or kV according to patient size and/or use of iterative reconstruction technique. COMPARISON:  None Available. FINDINGS: Cardiovascular: Heart is normal size. Aorta is normal caliber. Scattered coronary artery and aortic atherosclerosis. Mediastinum/Nodes: Mildly enlarged scattered mediastinal lymph nodes. Right paratracheal lymph node has a short axis diameter of 11 mm. Other similarly sized paratracheal, AP window and subcarinal lymph nodes. No axillary or visible hilar adenopathy. Trachea and thyroid  unremarkable. Esophagus is filled with fluid/debris. Lungs/Pleura: Small to moderate bilateral pleural effusions. Compressive atelectasis in the lower lobes. Patchy ground-glass opacities in the upper lobes, likely infectious/inflammatory. Upper Abdomen: No acute findings Musculoskeletal: Chest wall soft tissues are unremarkable. No acute bony abnormality. IMPRESSION: Small to moderate bilateral pleural effusions. Compressive atelectasis in the lower lobes. Patchy ground-glass nodular opacities in the upper lobes, likely infectious/inflammatory. These could be followed after treatment to ensure resolution. Mildly enlarged mediastinal lymph nodes, likely reactive. Coronary artery disease. Esophagus is filled with food debris and fluid a could be related to reflux or dysmotility. Aortic Atherosclerosis (ICD10-I70.0). Electronically Signed   By: Franky Crease M.D.   On: 10/20/2023 22:07   US  Venous Img Upper Uni Left (DVT) Result Date:  10/15/2023 CLINICAL DATA:  Left arm swelling EXAM: LEFT UPPER EXTREMITY VENOUS DOPPLER ULTRASOUND TECHNIQUE: Gray-scale sonography with graded compression, as well as color Doppler and duplex ultrasound were performed to evaluate the upper extremity deep venous system from the level of the subclavian vein and including the jugular, axillary, basilic, radial, ulnar and upper cephalic vein. Spectral Doppler was utilized to evaluate flow at rest and with distal augmentation maneuvers. COMPARISON:  None Available. FINDINGS: Contralateral Subclavian Vein: Respiratory phasicity is normal and symmetric with the symptomatic side. No evidence of thrombus. Normal compressibility. Internal Jugular Vein: No evidence of thrombus. Normal compressibility, respiratory phasicity and response to augmentation. Subclavian Vein: No evidence of thrombus. Normal compressibility, respiratory phasicity and response to augmentation. Axillary Vein: No evidence of thrombus. Normal compressibility, respiratory phasicity and response to augmentation. Cephalic Vein: Thrombus is noted with decreased compressibility Basilic Vein: No evidence of thrombus. Normal compressibility, respiratory phasicity and response to augmentation. Brachial Veins: No evidence of thrombus. Normal compressibility, respiratory phasicity and response to  augmentation. Radial Veins: No evidence of thrombus. Normal compressibility, respiratory phasicity and response to augmentation. Ulnar Veins: No evidence of thrombus. Normal compressibility, respiratory phasicity and response to augmentation. Venous Reflux:  None visualized. Other Findings:  None visualized. IMPRESSION: No evidence of DVT within the left upper extremity. Superficial cephalic vein thrombus is noted. Electronically Signed   By: Oneil Devonshire M.D.   On: 10/15/2023 20:32   DG CHEST PORT 1 VIEW Result Date: 10/15/2023 CLINICAL DATA:  Shortness of breath EXAM: PORTABLE CHEST 1 VIEW COMPARISON:  10/11/2023  FINDINGS: Stable cardiomediastinal contours. Aortic atherosclerosis. Chronic coarsened interstitial markings are identified bilateral. New asymmetric opacification within the left base compatible with atelectasis and or airspace disease. There is blunting of the left costophrenic angle which may be due to atelectasis. A pleural effusion is not excluded. IMPRESSION: 1. New asymmetric opacification within the left base compatible with atelectasis and/or airspace disease. 2. Blunting of the left costophrenic angle may be due to a small pleural effusion. 3. Diffuse chronic interstitial coarsening noted bilaterally. Electronically Signed   By: Waddell Calk M.D.   On: 10/15/2023 13:09   US  RENAL Result Date: 10/12/2023 CLINICAL DATA:  Acute kidney insufficiency EXAM: RENAL / URINARY TRACT ULTRASOUND COMPLETE COMPARISON:  Ultrasound 07/19/2020 FINDINGS: Right Kidney: Renal measurements: 10.5 x 4.6 x 4.5 cm = volume: 112.7 mL. No perinephric fluid. No collecting system dilatation. Left Kidney: Renal measurements: 8.7 x 4.1 x 4.6 cm = volume: 86.9 mL. Slight perinephric fluid. Small anechoic structure associated with kidney measures 10 mm. Simple cyst. The left kidney is echogenic. Bladder: Appears normal for degree of bladder distention. Other: Left kidney not as well seen with overlapping bowel gas and soft tissue. IMPRESSION: No collecting system dilatation.  Small echogenic left kidney. Electronically Signed   By: Ranell Bring M.D.   On: 10/12/2023 13:34   NM Pulmonary Perfusion Result Date: 10/11/2023 CLINICAL DATA:  High prob PE suspected, shortness of breath times weeks EXAM: NUCLEAR MEDICINE PERFUSION LUNG SCAN TECHNIQUE: Perfusion images were obtained in multiple projections after intravenous injection of radiopharmaceutical. Ventilation scans intentionally deferred if perfusion scan and chest x-ray adequate for interpretation during COVID 19 epidemic. RADIOPHARMACEUTICALS:  4.4 mCi Tc-82m MAA IV  COMPARISON:  Portable chest radiograph from the same day FINDINGS: Heterogenous bilateral distribution of radiopharmaceutical. No discrete segmental or subsegmental perfusion defects. IMPRESSION: Low likelihood ratio for pulmonary embolism. Electronically Signed   By: JONETTA Faes M.D.   On: 10/11/2023 13:37   DG Chest Portable 1 View Result Date: 10/11/2023 CLINICAL DATA:  Shortness of breath. EXAM: PORTABLE CHEST 1 VIEW COMPARISON:  10/01/2023 FINDINGS: The lungs are clear without focal pneumonia, edema, pneumothorax or pleural effusion. Interstitial markings are diffusely coarsened with chronic features. The cardio pericardial silhouette is enlarged. No acute bony abnormality. Telemetry leads overlie the chest. IMPRESSION: Chronic interstitial coarsening without acute cardiopulmonary findings. Electronically Signed   By: Camellia Candle M.D.   On: 10/11/2023 07:27   ECHOCARDIOGRAM COMPLETE Result Date: 10/02/2023    ECHOCARDIOGRAM REPORT   Patient Name:   ELIZAVETA MATTICE Date of Exam: 10/02/2023 Medical Rec #:  [id number]       Height:       65.0 in Accession #:    [id number]      Weight:       147.5 lb Date of Birth:  01-06-1936       BSA:          1.738 m Patient Age:    37  years        BP:           171/71 mmHg Patient Gender: F               HR:           59 bpm. Exam Location:  [facility] Procedure: 2D Echo, Cardiac Doppler and Color Doppler Indications:    Elevated Troponin  History:        Patient has no prior history of Echocardiogram examinations.                 Risk Factors:Hypertensive urgency. DOE p covid 19.  Sonographer:    Aida Pizza RCS Referring Phys: [phone] CARLOS MADERA IMPRESSIONS  1. Left ventricular ejection fraction, by estimation, is 65 to 70%. The left ventricle has normal function. The left ventricle has no regional wall motion abnormalities. There is moderate concentric left ventricular hypertrophy. Left ventricular diastolic parameters are indeterminate.  2. Right ventricular  systolic function is normal. The right ventricular size is normal.  3. Left atrial size was mildly dilated.  4. The mitral valve is abnormal. Mild to moderate mitral valve regurgitation. Moderate mitral annular calcification.  5. The aortic valve is tricuspid. There is mild calcification of the aortic valve. Aortic valve regurgitation is not visualized. Aortic valve sclerosis/calcification is present, without any evidence of aortic stenosis.  6. The inferior vena cava is normal in size with greater than 50% respiratory variability, suggesting right atrial pressure of 3 mmHg. Comparison(s): No prior Echocardiogram. FINDINGS  Left Ventricle: Left ventricular ejection fraction, by estimation, is 65 to 70%. The left ventricle has normal function. The left ventricle has no regional wall motion abnormalities. The left ventricular internal cavity size was normal in size. There is  moderate concentric left ventricular hypertrophy. Left ventricular diastolic parameters are indeterminate. Right Ventricle: The right ventricular size is normal. No increase in right ventricular wall thickness. Right ventricular systolic function is normal. Left Atrium: Left atrial size was mildly dilated. Right Atrium: Right atrial size was not assessed. Pericardium: Trivial pericardial effusion is present. The pericardial effusion is surrounding the apex. Mitral Valve: Calcified and prolapse in the 2 chamber, central regurgitation worst in the 4 chamber. The mitral valve is abnormal. Moderate mitral annular calcification. Mild to moderate mitral valve regurgitation. Tricuspid Valve: The tricuspid valve is normal in structure. Tricuspid valve regurgitation is not demonstrated. No evidence of tricuspid stenosis. Aortic Valve: The aortic valve is tricuspid. There is mild calcification of the aortic valve. Aortic valve regurgitation is not visualized. Aortic valve sclerosis/calcification is present, without any evidence of aortic stenosis. Pulmonic  Valve: The pulmonic valve was normal in structure. Pulmonic valve regurgitation is trivial. No evidence of pulmonic stenosis. Aorta: The aortic root is normal in size and structure. Venous: The inferior vena cava is normal in size with greater than 50% respiratory variability, suggesting right atrial pressure of 3 mmHg. IAS/Shunts: The atrial septum is grossly normal.  LEFT VENTRICLE PLAX 2D LVIDd:         4.20 cm   Diastology LVIDs:         2.70 cm   LV e' medial:    2.83 cm/s LV PW:         1.30 cm   LV E/e' medial:  21.8 LV IVS:        1.30 cm   LV e' lateral:   3.15 cm/s LVOT diam:     1.90 cm   LV E/e' lateral:  19.6 LV SV:         63 LV SV Index:   36 LVOT Area:     2.84 cm  RIGHT VENTRICLE RV S prime:     10.70 cm/s TAPSE (M-mode): 1.8 cm LEFT ATRIUM             Index        RIGHT ATRIUM           Index LA diam:        3.60 cm 2.07 cm/m   RA Area:     15.70 cm LA Vol (A2C):   73.2 ml 42.12 ml/m  RA Volume:   39.00 ml  22.44 ml/m LA Vol (A4C):   56.1 ml 32.28 ml/m LA Biplane Vol: 64.5 ml 37.11 ml/m  AORTIC VALVE LVOT Vmax:   102.00 cm/s LVOT Vmean:  66.600 cm/s LVOT VTI:    0.222 m  AORTA Ao Root diam: 3.20 cm MITRAL VALVE MV Area (PHT): 2.83 cm       SHUNTS MV Decel Time: 268 msec       Systemic VTI:  0.22 m MR Peak grad:    139.2 mmHg   Systemic Diam: 1.90 cm MR Mean grad:    86.0 mmHg MR Vmax:         590.00 cm/s MR Vmean:        425.0 cm/s MR PISA:         1.01 cm MR PISA Eff ROA: 5 mm MR PISA Radius:  0.40 cm MV E velocity: 61.70 cm/s MV A velocity: 101.00 cm/s MV E/A ratio:  0.61 Stanly Leavens MD Electronically signed by Stanly Leavens MD Signature Date/Time: 10/02/2023/2:56:39 PM    Final    CT HEAD WO CONTRAST ( ) Result Date: 10/01/2023 CLINICAL DATA:  Headache, new onset. EXAM: CT HEAD WITHOUT CONTRAST TECHNIQUE: Contiguous axial images were obtained from the base of the skull through the vertex without intravenous contrast. RADIATION DOSE REDUCTION: This exam was  performed according to the departmental dose-optimization program which includes automated exposure control, adjustment of the mA and/or kV according to patient size and/or use of iterative reconstruction technique. COMPARISON:  None Available. FINDINGS: Brain: No evidence of acute infarction, hemorrhage, hydrocephalus, extra-axial collection or mass lesion/mass effect. Preserved brain volume for age with minor white matter low-density. Intermittent superimposed streak artifact. Vascular: No hyperdense vessel or unexpected calcification. Skull: Normal. Negative for fracture or focal lesion. Sinuses/Orbits: Negative.  No sinusitis. IMPRESSION: Unremarkable head CT for age. Electronically Signed   By: Dorn Roulette M.D.   On: 10/01/2023 06:27   DG Chest Port 1 View Result Date: 10/01/2023 CLINICAL DATA:  Tachycardia and inability to rest.  Nausea EXAM: PORTABLE CHEST 1 VIEW COMPARISON:  08/13/2022 FINDINGS: Artifact from EKG leads. Normal heart size and mediastinal contours. No acute infiltrate or edema. No effusion or pneumothorax. No acute osseous findings. Generalized thoracic endplate spurring with mild scoliosis. IMPRESSION: No evidence of active disease. Electronically Signed   By: Dorn Roulette M.D.   On: 10/01/2023 05:37    Alm Schneider, DO  [facility]  If 7PM-7AM, please contact night-coverage [email or web address] Password [facility] 10/22/2023, 5:37 PM   LOS: 10 days

## 2023-10-22 NOTE — Care Management Important Message (Signed)
 Important Message  Patient Details  Name: Sherry Waller MRN: [id number] Date of Birth: May 09, 1936   Important Message Given:  Yes - Medicare IM     Corey Harold 10/22/2023, 11:25 AM

## 2023-10-23 DIAGNOSIS — J9601 Acute respiratory failure with hypoxia: Secondary | ICD-10-CM | POA: Diagnosis not present

## 2023-10-23 DIAGNOSIS — N1831 Chronic kidney disease, stage 3a: Secondary | ICD-10-CM | POA: Diagnosis not present

## 2023-10-23 DIAGNOSIS — N179 Acute kidney failure, unspecified: Secondary | ICD-10-CM | POA: Diagnosis not present

## 2023-10-23 DIAGNOSIS — J690 Pneumonitis due to inhalation of food and vomit: Secondary | ICD-10-CM | POA: Diagnosis not present

## 2023-10-23 LAB — GLUCOSE, CAPILLARY
Glucose-Capillary: 106 mg/dL — ABNORMAL HIGH (ref 70–99)
Glucose-Capillary: 114 mg/dL — ABNORMAL HIGH (ref 70–99)

## 2023-10-23 LAB — RENAL FUNCTION PANEL
Albumin: 2.7 g/dL — ABNORMAL LOW (ref 3.5–5.0)
Anion gap: 8 (ref 5–15)
BUN: 35 mg/dL — ABNORMAL HIGH (ref 8–23)
CO2: 23 mmol/L (ref 22–32)
Calcium: 9.2 mg/dL (ref 8.9–10.3)
Chloride: 101 mmol/L (ref 98–111)
Creatinine, Ser: 1.44 mg/dL — ABNORMAL HIGH (ref 0.44–1.00)
GFR, Estimated: 36 mL/min — ABNORMAL LOW (ref >60)
Glucose, Bld: 117 mg/dL — ABNORMAL HIGH (ref 70–99)
Phosphorus: 3.7 mg/dL (ref 2.5–4.6)
Potassium: 4.5 mmol/L (ref 3.5–5.1)
Sodium: 132 mmol/L — ABNORMAL LOW (ref 135–145)

## 2023-10-23 MED ORDER — AMOXICILLIN-POT CLAVULANATE 875-125 MG PO TABS: 1.0000 | ORAL_TABLET | Freq: Two times a day (BID) | ORAL | Status: DC

## 2023-10-23 MED ORDER — HYDRALAZINE HCL 100 MG PO TABS: 100.0000 mg | ORAL_TABLET | Freq: Three times a day (TID) | ORAL | Status: DC

## 2023-10-23 MED ORDER — CYANOCOBALAMIN 500 MCG PO TABS: 500.0000 ug | ORAL_TABLET | Freq: Every day | ORAL | Status: DC

## 2023-10-23 MED ORDER — CLONIDINE HCL 0.3 MG PO TABS: 0.3000 mg | ORAL_TABLET | Freq: Two times a day (BID) | ORAL | Status: DC

## 2023-10-23 MED ORDER — VITAMIN B-12 100 MCG PO TABS
500.0000 ug | ORAL_TABLET | Freq: Every day | ORAL | Status: DC
  Administered 2023-10-23: 500 ug via ORAL
  Filled 2023-10-23: qty 5

## 2023-10-23 MED ORDER — AMOXICILLIN-POT CLAVULANATE 875-125 MG PO TABS
1.0000 | ORAL_TABLET | Freq: Two times a day (BID) | ORAL | Status: DC
  Administered 2023-10-23: 1 via ORAL
  Filled 2023-10-23: qty 1

## 2023-10-23 NOTE — TOC Progression Note (Addendum)
 Transition of Care [facility]) - Progression Note    Patient Details  Name: Sherry Waller MRN: [id number] Date of Birth: October 01, 1936  [facility] CM/SW Contact  Lorraine LILLETTE Fenton, LCSW Phone Number: 10/23/2023, 7:56 AM  Clinical Narrative:     CSW checked Availity/auth approved.  TC to [facility] and spoke with Alan, provided auth #.  Pt can DC to [facility] today if ready and call report to [facility] at [phone]- speak to [facility].  CSW will share with MD via secure chat. TOC to follow.  Addendum: CSW contacted son to update on DC to [facility] today.   Expected Discharge Plan: Skilled Nursing Facility Barriers to Discharge: Continued Medical Work up  Expected Discharge Plan and Services In-house Referral: Clinical Social Work   Post Acute Care Choice: Durable Medical Equipment Living arrangements for the past 2 months: Single Family Home Expected Discharge Date: 10/19/23                                     Social Determinants of Health (SDOH) Interventions SDOH Screenings   Food Insecurity: No Food Insecurity (10/11/2023)  Housing: Low Risk  (10/11/2023)  Transportation Needs: No Transportation Needs (10/11/2023)  Utilities: Not At Risk (10/11/2023)  Social Connections: Socially Isolated (10/11/2023)  Tobacco Use: Low Risk  (10/11/2023)    Readmission Risk Interventions    10/13/2023   11:03 AM  Readmission Risk Prevention Plan  Transportation Screening Complete  HRI or Home Care Consult Complete  Social Work Consult for Recovery Care Planning/Counseling Complete  Palliative Care Screening Not Applicable  Medication Review Oceanographer) Complete

## 2023-10-23 NOTE — Plan of Care (Signed)
  Problem: Health Behavior/Discharge Planning: Goal: Ability to manage health-related needs will improve Outcome: Adequate for Discharge   Problem: Clinical Measurements: Goal: Ability to maintain clinical measurements within normal limits will improve Outcome: Adequate for Discharge Goal: Will remain free from infection Outcome: Adequate for Discharge Goal: Diagnostic test results will improve Outcome: Adequate for Discharge Goal: Respiratory complications will improve Outcome: Adequate for Discharge   Problem: Activity: Goal: Risk for activity intolerance will decrease Outcome: Adequate for Discharge   Problem: Nutrition: Goal: Adequate nutrition will be maintained Outcome: Adequate for Discharge   Problem: Elimination: Goal: Will not experience complications related to bowel motility Outcome: Adequate for Discharge Goal: Will not experience complications related to urinary retention Outcome: Adequate for Discharge   Problem: Pain Management: Goal: General experience of comfort will improve Outcome: Adequate for Discharge   Problem: Safety: Goal: Ability to remain free from injury will improve Outcome: Adequate for Discharge   Problem: Skin Integrity: Goal: Risk for impaired skin integrity will decrease Outcome: Adequate for Discharge   Problem: Fluid Volume: Goal: Ability to maintain a balanced intake and output will improve Outcome: Adequate for Discharge   Problem: Health Behavior/Discharge Planning: Goal: Ability to identify and utilize available resources and services will improve Outcome: Adequate for Discharge Goal: Ability to manage health-related needs will improve Outcome: Adequate for Discharge   Problem: Metabolic: Goal: Ability to maintain appropriate glucose levels will improve Outcome: Adequate for Discharge   Problem: Skin Integrity: Goal: Risk for impaired skin integrity will decrease Outcome: Adequate for Discharge   Problem: Acute Rehab  PT Goals(only PT should resolve) Goal: Pt Will Go Supine/Side To Sit Outcome: Adequate for Discharge Goal: Patient Will Transfer Sit To/From Stand Outcome: Adequate for Discharge Goal: Pt Will Transfer Bed To Chair/Chair To Bed Outcome: Adequate for Discharge Goal: Pt Will Perform Standing Balance Or Pre-Gait Outcome: Adequate for Discharge Goal: Pt Will Ambulate Outcome: Adequate for Discharge

## 2023-10-23 NOTE — Plan of Care (Signed)
  Problem: Health Behavior/Discharge Planning: Goal: Ability to manage health-related needs will improve 10/23/2023 0714 by Jujuan Dugo, RN Outcome: Progressing 10/23/2023 0714 by Catharine Kand, RN Outcome: Progressing   Problem: Clinical Measurements: Goal: Ability to maintain clinical measurements within normal limits will improve 10/23/2023 0714 by Catharine Kand, RN Outcome: Progressing 10/23/2023 0714 by Catharine Kand, RN Outcome: Progressing Goal: Will remain free from infection 10/23/2023 0714 by Zoriah Pulice, RN Outcome: Progressing 10/23/2023 0714 by Catharine Kand, RN Outcome: Progressing Goal: Diagnostic test results will improve 10/23/2023 0714 by Lacy Sofia, RN Outcome: Progressing 10/23/2023 0714 by Ranata Laughery, RN Outcome: Progressing Goal: Respiratory complications will improve 10/23/2023 0714 by Tanay Massiah, RN Outcome: Progressing 10/23/2023 0714 by Katia Hannen, RN Outcome: Progressing   Problem: Activity: Goal: Risk for activity intolerance will decrease 10/23/2023 0714 by Catharine Kand, RN Outcome: Progressing 10/23/2023 0714 by Catharine Kand, RN Outcome: Progressing   Problem: Nutrition: Goal: Adequate nutrition will be maintained 10/23/2023 0714 by Catharine Kand, RN Outcome: Progressing 10/23/2023 0714 by Catharine Kand, RN Outcome: Progressing   Problem: Elimination: Goal: Will not experience complications related to bowel motility 10/23/2023 0714 by Catharine Kand, RN Outcome: Progressing 10/23/2023 0714 by Catharine Kand, RN Outcome: Progressing Goal: Will not experience complications related to urinary retention 10/23/2023 0714 by Catharine Kand, RN Outcome: Progressing 10/23/2023 0714 by Catharine Kand, RN Outcome: Progressing   Problem: Pain Management: Goal: General experience of comfort will improve 10/23/2023 0714 by Natelie Ostrosky, RN Outcome: Progressing 10/23/2023 0714 by Catharine Kand, RN Outcome: Progressing   Problem: Safety: Goal: Ability to remain free from injury will improve 10/23/2023  0714 by Catharine Kand, RN Outcome: Progressing 10/23/2023 0714 by Catharine Kand, RN Outcome: Progressing   Problem: Skin Integrity: Goal: Risk for impaired skin integrity will decrease 10/23/2023 0714 by Catharine Kand, RN Outcome: Progressing 10/23/2023 0714 by Catharine Kand, RN Outcome: Progressing   Problem: Fluid Volume: Goal: Ability to maintain a balanced intake and output will improve 10/23/2023 0714 by Catharine Kand, RN Outcome: Progressing 10/23/2023 0714 by Catharine Kand, RN Outcome: Progressing   Problem: Health Behavior/Discharge Planning: Goal: Ability to identify and utilize available resources and services will improve 10/23/2023 0714 by Catharine Kand, RN Outcome: Progressing 10/23/2023 0714 by Catharine Kand, RN Outcome: Progressing Goal: Ability to manage health-related needs will improve 10/23/2023 0714 by Catharine Kand, RN Outcome: Progressing 10/23/2023 0714 by Catharine Kand, RN Outcome: Progressing   Problem: Metabolic: Goal: Ability to maintain appropriate glucose levels will improve 10/23/2023 0714 by Catharine Kand, RN Outcome: Progressing 10/23/2023 0714 by Catharine Kand, RN Outcome: Progressing   Problem: Skin Integrity: Goal: Risk for impaired skin integrity will decrease 10/23/2023 0714 by Catharine Kand, RN Outcome: Progressing 10/23/2023 0714 by Catharine Kand, RN Outcome: Progressing

## 2023-10-23 NOTE — Discharge Summary (Signed)
 Physician Discharge Summary   Patient: Sherry Waller MRN: [id number] DOB: 23-Nov-1935  Admit date:     10/11/2023  Discharge date: 10/23/23  Discharge Physician: Alm Eshawn Coor   PCP: [name], MD   Recommendations at discharge:   Please follow up with primary care provider within 1-2 weeks  Please repeat BMP and CBC in one week Currently on 2L [place], please wean to RA for saturation >92%    Hospital Course:  88 y.o. female with past medical history significant of hypothyroidism, depression, malignant HTN, DM2, CKD 3B, and  GERD who was recently discharged from this hospital on on 10/03/2023 after treatment for hypertensive urgency and E. coli UTI -Patient returns to the ED on 10/11/2023 with lethargy generalized weakness and hypoxia -Additional history obtained from patient's son [name] at bedside---apparently she has been so weak that she has been urinating and having BMs in bed---reports loose stools, poor appetite, nausea but no vomiting---denies blood in stool Apparently, the patient also had a productive cough with yellow sputum. In the ED, the patient was afebrile and hemodynamically stable with oxygen saturation 87% room air.  She was placed on 2 L nasal cannula with saturation 95%.  Chest x-ray showed chronic interstitial markings.  VQ scan was low probability PE.  WBC 10.0, hemoglobin 9.9, serum creatinine 2.23. During the hospitalization, the patient developed worsening hyponatremia with a nadir of 118.  She was placed on hypertonic saline for short period of time.  Nephrology was consulted to assist with management.  It was felt this was in part due to the patient's renal failure.  Further workup was undertaken for renal failure.  Her hospitalization was prolonged secondary to waiting for insurance authorization for skilled nursing facility.  Cardiology was consulted secondary to the patient's wide pulse pressures.  Was felt this is secondary to atherosclerosis.  Her blood pressure  medications were adjusted.  Diltiazem  was discontinued, and the patient was started on amlodipine . Her BP overall improved and stabilized.  Her renal function gradually improved and serum creatinine was 1.44 on day of d/c.  It was felt her aKI was due to hemodynamic changes.  Assessment and Plan: Acute on chronic renal failure--CKD stage IIIa -Baseline creatinine 1.1-1.3 -Serum creatinine peaked 2.52 -Felt to be secondary to hemodynamic mediated ATN -Autoimmune serologies were negative -Kappa and lambda light chain ratio was within normal limits and not favoring plasma cell dyscrasia -Serum creatinine continues to improve -Encouraged to follow-up with outpatient nephrologist, Dr. Rachele -serum creatinine 1.44 on day of d/c   Hyponatremia -In part secondary to fluctuations from her hypovolemic to hypervolemic state and renal failure -Patient initially on isotonic saline -Patient required hypertonic saline for short period of time which has been discontinued -Overall improved -TSH 2.168 -A.m. cortisol 19.8 -1/9--discussed with Dr. Gordy Blanch   Malignant hypertension with wide pulse pressure -Appreciate cardiology evaluation -Felt to be due to arterial sclerosis with advanced age -Overall BPs are reasonably controlled -Continue amlodipine , carvedilol , clonidine , hydralazine , Imdur    Acute respiratory failure with hypoxia -VQ scan low probability PE -Chest x-ray with chronic interstitial markings -1/9 CT chest without contrast--sm-mod pleural effusion; UL nodular opacities; esophagus filled with foold and debris -Stable on 2 L -10/11/2023 ABG 7.4/32/61/19 -Wean oxygen as tolerated for saturation greater 92% -Incentive spirometry -1/9 lasix  IV x 1   FeverAspiration pneumonitis -due to aspiration pneumonitis -COVID-19, RSV, influenza negative -UA negative for pyuria -started Unasyn  1/9 -speech therapy eval>>regular diet with thin liquids -d/c with amox/clav x 5 more days  Acute anemia -Baseline hemoglobin 13 -Hemoglobin stable at 8-9 during hospitalization -FOBT negative -B12--136>> replace -Folate normal -Iron saturation 8%, ferritin 127>>> started ferrous sulfate    Elevated light chains --Kappa and lambda light chain ratio was within normal limits and not favoring plasma cell dyscrasia   Hyperkalemia -Patient was treated with Lokelma  -improved -repeat BMP one week after d/c   Diabetes mellitus type 2, controlled -10/01/2023 hemoglobin A1c 6.6 -Continue NovoLog  sliding scale   Hypothyroidism -Continue Synthroid           Consultants: renal Procedures performed: none  Disposition: Skilled nursing facility Diet recommendation:  Discharge Diet Orders (From admission, onward)     Start     Ordered   10/19/23 0000  Diet Carb Modified        10/19/23 1112           Carb modified diet DISCHARGE MEDICATION: Allergies as of 10/23/2023       Reactions   Fenofibrate Other (See Comments)   Aches   Statins Itching   Sulfa Antibiotics Other (See Comments)   headahces        Medication List     STOP taking these medications    diltiazem  240 MG 24 hr capsule Commonly known as: CARDIZEM  CD   furosemide  20 MG tablet Commonly known as: LASIX    olmesartan  40 MG tablet Commonly known as: BENICAR    traMADol  50 MG tablet Commonly known as: ULTRAM        TAKE these medications    acetaminophen  500 MG tablet Commonly known as: TYLENOL  Take 2 tablets (1,000 mg total) by mouth every 8 (eight) hours as needed for mild pain (pain score 1-3), headache or fever.   amLODipine  10 MG tablet Commonly known as: NORVASC  Take 1 tablet (10 mg total) by mouth daily.   amoxicillin -clavulanate 875-125 MG tablet Commonly known as: AUGMENTIN  Take 1 tablet by mouth every 12 (twelve) hours. X 5 days   carvedilol  3.125 MG tablet Commonly known as: COREG  Take 1 tablet (3.125 mg total) by mouth 2 (two) times daily.   cloNIDine  0.3 MG  tablet Commonly known as: CATAPRES  Take 1 tablet (0.3 mg total) by mouth 2 (two) times daily. What changed: when to take this   cyanocobalamin  500 MCG tablet Commonly known as: VITAMIN B12 Take 1 tablet (500 mcg total) by mouth daily.   escitalopram  5 MG tablet Commonly known as: LEXAPRO  Take 1 tablet (5 mg total) by mouth daily. What changed:  medication strength how much to take additional instructions   ferrous sulfate  325 (65 FE) MG tablet Take 1 tablet (325 mg total) by mouth daily with breakfast.   gabapentin  100 MG capsule Commonly known as: NEURONTIN  Take 100 mg by mouth daily.   hydrALAZINE  100 MG tablet Commonly known as: APRESOLINE  Take 1 tablet (100 mg total) by mouth 3 (three) times daily. What changed:  medication strength how much to take when to take this   insulin  aspart 100 UNIT/ML injection Commonly known as: novoLOG  Inject 0-6 Units into the skin 3 (three) times daily with meals.   isosorbide  mononitrate 60 MG 24 hr tablet Commonly known as: IMDUR  Take 1 tablet (60 mg total) by mouth daily.   levothyroxine  50 MCG tablet Commonly known as: SYNTHROID  Take 50 mcg by mouth daily before breakfast.   megestrol  400 MG/10ML suspension Commonly known as: MEGACE  Take 10 mLs (400 mg total) by mouth daily.   mouth rinse Liqd solution 15 mLs by Mouth Rinse route as needed (for oral  care).   pantoprazole  40 MG tablet Commonly known as: PROTONIX  Take 1 tablet (40 mg total) by mouth daily.        Contact information for after-discharge care     Destination     [facility] .   Service: Skilled Nursing Contact information: [address] [place]  [address] [phone]                    Discharge Exam: Filed Weights   10/21/23 0500 10/22/23 0429 10/23/23 0500  Weight: 77.5 kg 79.5 kg 76.7 kg   HEENT:  /AT, No thrush, no icterus CV:  RRR, no rub, no S3, no S4 Lung:  bibasilar rales.   No wheeze Abd:  soft/+BS, NT Ext:  trace nonpitting edema, no lymphangitis, no synovitis, no rash   Condition at discharge: stable  The results of significant diagnostics from this hospitalization (including imaging, microbiology, ancillary and laboratory) are listed below for reference.   Imaging Studies: CT CHEST WO CONTRAST Result Date: 10/20/2023 CLINICAL DATA:  Hypoxia, cough, shortness of breath EXAM: CT CHEST WITHOUT CONTRAST TECHNIQUE: Multidetector CT imaging of the chest was performed following the standard protocol without IV contrast. RADIATION DOSE REDUCTION: This exam was performed according to the departmental dose-optimization program which includes automated exposure control, adjustment of the mA and/or kV according to patient size and/or use of iterative reconstruction technique. COMPARISON:  None Available. FINDINGS: Cardiovascular: Heart is normal size. Aorta is normal caliber. Scattered coronary artery and aortic atherosclerosis. Mediastinum/Nodes: Mildly enlarged scattered mediastinal lymph nodes. Right paratracheal lymph node has a short axis diameter of 11 mm. Other similarly sized paratracheal, AP window and subcarinal lymph nodes. No axillary or visible hilar adenopathy. Trachea and thyroid  unremarkable. Esophagus is filled with fluid/debris. Lungs/Pleura: Small to moderate bilateral pleural effusions. Compressive atelectasis in the lower lobes. Patchy ground-glass opacities in the upper lobes, likely infectious/inflammatory. Upper Abdomen: No acute findings Musculoskeletal: Chest wall soft tissues are unremarkable. No acute bony abnormality. IMPRESSION: Small to moderate bilateral pleural effusions. Compressive atelectasis in the lower lobes. Patchy ground-glass nodular opacities in the upper lobes, likely infectious/inflammatory. These could be followed after treatment to ensure resolution. Mildly enlarged mediastinal lymph nodes, likely reactive. Coronary artery disease.  Esophagus is filled with food debris and fluid a could be related to reflux or dysmotility. Aortic Atherosclerosis (ICD10-I70.0). Electronically Signed   By: Franky Crease M.D.   On: 10/20/2023 22:07   US  Venous Img Upper Uni Left (DVT) Result Date: 10/15/2023 CLINICAL DATA:  Left arm swelling EXAM: LEFT UPPER EXTREMITY VENOUS DOPPLER ULTRASOUND TECHNIQUE: Gray-scale sonography with graded compression, as well as color Doppler and duplex ultrasound were performed to evaluate the upper extremity deep venous system from the level of the subclavian vein and including the jugular, axillary, basilic, radial, ulnar and upper cephalic vein. Spectral Doppler was utilized to evaluate flow at rest and with distal augmentation maneuvers. COMPARISON:  None Available. FINDINGS: Contralateral Subclavian Vein: Respiratory phasicity is normal and symmetric with the symptomatic side. No evidence of thrombus. Normal compressibility. Internal Jugular Vein: No evidence of thrombus. Normal compressibility, respiratory phasicity and response to augmentation. Subclavian Vein: No evidence of thrombus. Normal compressibility, respiratory phasicity and response to augmentation. Axillary Vein: No evidence of thrombus. Normal compressibility, respiratory phasicity and response to augmentation. Cephalic Vein: Thrombus is noted with decreased compressibility Basilic Vein: No evidence of thrombus. Normal compressibility, respiratory phasicity and response to augmentation. Brachial Veins: No evidence of thrombus. Normal compressibility,  respiratory phasicity and response to augmentation. Radial Veins: No evidence of thrombus. Normal compressibility, respiratory phasicity and response to augmentation. Ulnar Veins: No evidence of thrombus. Normal compressibility, respiratory phasicity and response to augmentation. Venous Reflux:  None visualized. Other Findings:  None visualized. IMPRESSION: No evidence of DVT within the left upper extremity.  Superficial cephalic vein thrombus is noted. Electronically Signed   By: Oneil Devonshire M.D.   On: 10/15/2023 20:32   DG CHEST PORT 1 VIEW Result Date: 10/15/2023 CLINICAL DATA:  Shortness of breath EXAM: PORTABLE CHEST 1 VIEW COMPARISON:  10/11/2023 FINDINGS: Stable cardiomediastinal contours. Aortic atherosclerosis. Chronic coarsened interstitial markings are identified bilateral. New asymmetric opacification within the left base compatible with atelectasis and or airspace disease. There is blunting of the left costophrenic angle which may be due to atelectasis. A pleural effusion is not excluded. IMPRESSION: 1. New asymmetric opacification within the left base compatible with atelectasis and/or airspace disease. 2. Blunting of the left costophrenic angle may be due to a small pleural effusion. 3. Diffuse chronic interstitial coarsening noted bilaterally. Electronically Signed   By: Waddell Calk M.D.   On: 10/15/2023 13:09   US  RENAL Result Date: 10/12/2023 CLINICAL DATA:  Acute kidney insufficiency EXAM: RENAL / URINARY TRACT ULTRASOUND COMPLETE COMPARISON:  Ultrasound 07/19/2020 FINDINGS: Right Kidney: Renal measurements: 10.5 x 4.6 x 4.5 cm = volume: 112.7 mL. No perinephric fluid. No collecting system dilatation. Left Kidney: Renal measurements: 8.7 x 4.1 x 4.6 cm = volume: 86.9 mL. Slight perinephric fluid. Small anechoic structure associated with kidney measures 10 mm. Simple cyst. The left kidney is echogenic. Bladder: Appears normal for degree of bladder distention. Other: Left kidney not as well seen with overlapping bowel gas and soft tissue. IMPRESSION: No collecting system dilatation.  Small echogenic left kidney. Electronically Signed   By: Ranell Bring M.D.   On: 10/12/2023 13:34   NM Pulmonary Perfusion Result Date: 10/11/2023 CLINICAL DATA:  High prob PE suspected, shortness of breath times weeks EXAM: NUCLEAR MEDICINE PERFUSION LUNG SCAN TECHNIQUE: Perfusion images were obtained in  multiple projections after intravenous injection of radiopharmaceutical. Ventilation scans intentionally deferred if perfusion scan and chest x-ray adequate for interpretation during COVID 19 epidemic. RADIOPHARMACEUTICALS:  4.4 mCi Tc-63m MAA IV COMPARISON:  Portable chest radiograph from the same day FINDINGS: Heterogenous bilateral distribution of radiopharmaceutical. No discrete segmental or subsegmental perfusion defects. IMPRESSION: Low likelihood ratio for pulmonary embolism. Electronically Signed   By: JONETTA Faes M.D.   On: 10/11/2023 13:37   DG Chest Portable 1 View Result Date: 10/11/2023 CLINICAL DATA:  Shortness of breath. EXAM: PORTABLE CHEST 1 VIEW COMPARISON:  10/01/2023 FINDINGS: The lungs are clear without focal pneumonia, edema, pneumothorax or pleural effusion. Interstitial markings are diffusely coarsened with chronic features. The cardio pericardial silhouette is enlarged. No acute bony abnormality. Telemetry leads overlie the chest. IMPRESSION: Chronic interstitial coarsening without acute cardiopulmonary findings. Electronically Signed   By: Camellia Candle M.D.   On: 10/11/2023 07:27   ECHOCARDIOGRAM COMPLETE Result Date: 10/02/2023    ECHOCARDIOGRAM REPORT   Patient Name:   CHERL GORNEY Date of Exam: 10/02/2023 Medical Rec #:  [id number]       Height:       65.0 in Accession #:    [id number]      Weight:       147.5 lb Date of Birth:  16-May-1936       BSA:          1.738 m Patient  Age:    87 years        BP:           171/71 mmHg Patient Gender: F               HR:           59 bpm. Exam Location:  [facility] Procedure: 2D Echo, Cardiac Doppler and Color Doppler Indications:    Elevated Troponin  History:        Patient has no prior history of Echocardiogram examinations.                 Risk Factors:Hypertensive urgency. DOE p covid 19.  Sonographer:    Aida Pizza RCS Referring Phys: [phone] CARLOS MADERA IMPRESSIONS  1. Left ventricular ejection fraction, by estimation, is 65 to  70%. The left ventricle has normal function. The left ventricle has no regional wall motion abnormalities. There is moderate concentric left ventricular hypertrophy. Left ventricular diastolic parameters are indeterminate.  2. Right ventricular systolic function is normal. The right ventricular size is normal.  3. Left atrial size was mildly dilated.  4. The mitral valve is abnormal. Mild to moderate mitral valve regurgitation. Moderate mitral annular calcification.  5. The aortic valve is tricuspid. There is mild calcification of the aortic valve. Aortic valve regurgitation is not visualized. Aortic valve sclerosis/calcification is present, without any evidence of aortic stenosis.  6. The inferior vena cava is normal in size with greater than 50% respiratory variability, suggesting right atrial pressure of 3 mmHg. Comparison(s): No prior Echocardiogram. FINDINGS  Left Ventricle: Left ventricular ejection fraction, by estimation, is 65 to 70%. The left ventricle has normal function. The left ventricle has no regional wall motion abnormalities. The left ventricular internal cavity size was normal in size. There is  moderate concentric left ventricular hypertrophy. Left ventricular diastolic parameters are indeterminate. Right Ventricle: The right ventricular size is normal. No increase in right ventricular wall thickness. Right ventricular systolic function is normal. Left Atrium: Left atrial size was mildly dilated. Right Atrium: Right atrial size was not assessed. Pericardium: Trivial pericardial effusion is present. The pericardial effusion is surrounding the apex. Mitral Valve: Calcified and prolapse in the 2 chamber, central regurgitation worst in the 4 chamber. The mitral valve is abnormal. Moderate mitral annular calcification. Mild to moderate mitral valve regurgitation. Tricuspid Valve: The tricuspid valve is normal in structure. Tricuspid valve regurgitation is not demonstrated. No evidence of tricuspid  stenosis. Aortic Valve: The aortic valve is tricuspid. There is mild calcification of the aortic valve. Aortic valve regurgitation is not visualized. Aortic valve sclerosis/calcification is present, without any evidence of aortic stenosis. Pulmonic Valve: The pulmonic valve was normal in structure. Pulmonic valve regurgitation is trivial. No evidence of pulmonic stenosis. Aorta: The aortic root is normal in size and structure. Venous: The inferior vena cava is normal in size with greater than 50% respiratory variability, suggesting right atrial pressure of 3 mmHg. IAS/Shunts: The atrial septum is grossly normal.  LEFT VENTRICLE PLAX 2D LVIDd:         4.20 cm   Diastology LVIDs:         2.70 cm   LV e' medial:    2.83 cm/s LV PW:         1.30 cm   LV E/e' medial:  21.8 LV IVS:        1.30 cm   LV e' lateral:   3.15 cm/s LVOT diam:     1.90 cm  LV E/e' lateral: 19.6 LV SV:         63 LV SV Index:   36 LVOT Area:     2.84 cm  RIGHT VENTRICLE RV S prime:     10.70 cm/s TAPSE (M-mode): 1.8 cm LEFT ATRIUM             Index        RIGHT ATRIUM           Index LA diam:        3.60 cm 2.07 cm/m   RA Area:     15.70 cm LA Vol (A2C):   73.2 ml 42.12 ml/m  RA Volume:   39.00 ml  22.44 ml/m LA Vol (A4C):   56.1 ml 32.28 ml/m LA Biplane Vol: 64.5 ml 37.11 ml/m  AORTIC VALVE LVOT Vmax:   102.00 cm/s LVOT Vmean:  66.600 cm/s LVOT VTI:    0.222 m  AORTA Ao Root diam: 3.20 cm MITRAL VALVE MV Area (PHT): 2.83 cm       SHUNTS MV Decel Time: 268 msec       Systemic VTI:  0.22 m MR Peak grad:    139.2 mmHg   Systemic Diam: 1.90 cm MR Mean grad:    86.0 mmHg MR Vmax:         590.00 cm/s MR Vmean:        425.0 cm/s MR PISA:         1.01 cm MR PISA Eff ROA: 5 mm MR PISA Radius:  0.40 cm MV E velocity: 61.70 cm/s MV A velocity: 101.00 cm/s MV E/A ratio:  0.61 Stanly Leavens MD Electronically signed by Stanly Leavens MD Signature Date/Time: 10/02/2023/2:56:39 PM    Final    CT HEAD WO CONTRAST ( ) Result Date:  10/01/2023 CLINICAL DATA:  Headache, new onset. EXAM: CT HEAD WITHOUT CONTRAST TECHNIQUE: Contiguous axial images were obtained from the base of the skull through the vertex without intravenous contrast. RADIATION DOSE REDUCTION: This exam was performed according to the departmental dose-optimization program which includes automated exposure control, adjustment of the mA and/or kV according to patient size and/or use of iterative reconstruction technique. COMPARISON:  None Available. FINDINGS: Brain: No evidence of acute infarction, hemorrhage, hydrocephalus, extra-axial collection or mass lesion/mass effect. Preserved brain volume for age with minor white matter low-density. Intermittent superimposed streak artifact. Vascular: No hyperdense vessel or unexpected calcification. Skull: Normal. Negative for fracture or focal lesion. Sinuses/Orbits: Negative.  No sinusitis. IMPRESSION: Unremarkable head CT for age. Electronically Signed   By: Dorn Roulette M.D.   On: 10/01/2023 06:27   DG Chest Port 1 View Result Date: 10/01/2023 CLINICAL DATA:  Tachycardia and inability to rest.  Nausea EXAM: PORTABLE CHEST 1 VIEW COMPARISON:  08/13/2022 FINDINGS: Artifact from EKG leads. Normal heart size and mediastinal contours. No acute infiltrate or edema. No effusion or pneumothorax. No acute osseous findings. Generalized thoracic endplate spurring with mild scoliosis. IMPRESSION: No evidence of active disease. Electronically Signed   By: Dorn Roulette M.D.   On: 10/01/2023 05:37    Microbiology: Results for orders placed or performed during the hospital encounter of 10/11/23  Resp panel by RT-PCR (RSV, Flu A&B, Covid)     Status: None   Collection Time: 10/11/23  4:15 PM  Result Value Ref Range Status   SARS Coronavirus 2 by RT PCR NEGATIVE NEGATIVE Final    Comment: (NOTE) SARS-CoV-2 target nucleic acids are NOT DETECTED.  The SARS-CoV-2 RNA is generally detectable in  upper respiratory specimens during the  acute phase of infection. The lowest concentration of SARS-CoV-2 viral copies this assay can detect is 138 copies/mL. A negative result does not preclude SARS-Cov-2 infection and should not be used as the sole basis for treatment or other patient management decisions. A negative result may occur with  improper specimen collection/handling, submission of specimen other than nasopharyngeal swab, presence of viral mutation(s) within the areas targeted by this assay, and inadequate number of viral copies(<138 copies/mL). A negative result must be combined with clinical observations, patient history, and epidemiological information. The expected result is Negative.  Fact Sheet for Patients:  bloggercourse.com  Fact Sheet for Healthcare Providers:  seriousbroker.it  This test is no t yet approved or cleared by the [place]  FDA and  has been authorized for detection and/or diagnosis of SARS-CoV-2 by FDA under an Emergency Use Authorization (EUA). This EUA will remain  in effect (meaning this test can be used) for the duration of the COVID-19 declaration under Section 564(b)(1) of the Act, 21 U.S.C.section 360bbb-3(b)(1), unless the authorization is terminated  or revoked sooner.       Influenza A by PCR NEGATIVE NEGATIVE Final   Influenza B by PCR NEGATIVE NEGATIVE Final    Comment: (NOTE) The Xpert Xpress SARS-CoV-2/FLU/RSV plus assay is intended as an aid in the diagnosis of influenza from Nasopharyngeal swab specimens and should not be used as a sole basis for treatment. Nasal washings and aspirates are unacceptable for Xpert Xpress SARS-CoV-2/FLU/RSV testing.  Fact Sheet for Patients: bloggercourse.com  Fact Sheet for Healthcare Providers: seriousbroker.it  This test is not yet approved or cleared by the [place]  FDA and has been authorized for detection and/or  diagnosis of SARS-CoV-2 by FDA under an Emergency Use Authorization (EUA). This EUA will remain in effect (meaning this test can be used) for the duration of the COVID-19 declaration under Section 564(b)(1) of the Act, 21 U.S.C. section 360bbb-3(b)(1), unless the authorization is terminated or revoked.     Resp Syncytial Virus by PCR NEGATIVE NEGATIVE Final    Comment: (NOTE) Fact Sheet for Patients: bloggercourse.com  Fact Sheet for Healthcare Providers: seriousbroker.it  This test is not yet approved or cleared by the [place]  FDA and has been authorized for detection and/or diagnosis of SARS-CoV-2 by FDA under an Emergency Use Authorization (EUA). This EUA will remain in effect (meaning this test can be used) for the duration of the COVID-19 declaration under Section 564(b)(1) of the Act, 21 U.S.C. section 360bbb-3(b)(1), unless the authorization is terminated or revoked.  Performed at [facility], [address], [place], [place] [address]     Labs: CBC: Recent Labs  Lab 10/22/23 0415  WBC 6.0  HGB 8.2*  HCT 26.5*  MCV 91.7  PLT 294   Basic Metabolic Panel: Recent Labs  Lab 10/19/23 0430 10/20/23 0434 10/21/23 0431 10/22/23 0415 10/23/23 0402  NA 128* 132* 131* 130* 132*  K 4.7 4.9 4.9 4.4 4.5  CL 95* 98 98 98 101  CO2 27 25 24 25 23   GLUCOSE 114* 122* 113* 128* 117*  BUN 51* 45* 40* 40* 35*  CREATININE 1.66* 1.60* 1.67* 1.82* 1.44*  CALCIUM 8.9 9.1 9.1 9.1 9.2  PHOS 3.7 3.5 3.6 4.4 3.7   Liver Function Tests: Recent Labs  Lab 10/17/23 0619 10/18/23 0404 10/19/23 0430 10/20/23 0434 10/21/23 0431 10/22/23 0415 10/23/23 0402  AST 10* 11* 11*  --   --   --   --   ALT  11 11 11   --   --   --   --   ALKPHOS 88 85 80  --   --   --   --   BILITOT 0.5 0.6 0.5  --   --   --   --   PROT 5.6* 5.6* 5.4*  --   --   --   --   ALBUMIN  2.5* 2.5* 2.5* 2.4* 2.5* 2.7* 2.7*   CBG: Recent Labs  Lab  10/22/23 0816 10/22/23 1146 10/22/23 1626 10/22/23 2116 10/23/23 0825  GLUCAP 122* 122* 151* 100* 106*    Discharge time spent: greater than 30 minutes.  Signed: Alm Schneider, MD [facility] 10/23/2023

## 2023-10-23 NOTE — Plan of Care (Signed)
  Problem: Health Behavior/Discharge Planning: Goal: Ability to manage health-related needs will improve Outcome: Progressing   Problem: Clinical Measurements: Goal: Ability to maintain clinical measurements within normal limits will improve Outcome: Progressing Goal: Will remain free from infection Outcome: Progressing Goal: Diagnostic test results will improve Outcome: Progressing Goal: Respiratory complications will improve Outcome: Progressing   Problem: Activity: Goal: Risk for activity intolerance will decrease Outcome: Progressing   Problem: Nutrition: Goal: Adequate nutrition will be maintained Outcome: Progressing   Problem: Elimination: Goal: Will not experience complications related to bowel motility Outcome: Progressing Goal: Will not experience complications related to urinary retention Outcome: Progressing   Problem: Pain Management: Goal: General experience of comfort will improve Outcome: Progressing   Problem: Safety: Goal: Ability to remain free from injury will improve Outcome: Progressing   Problem: Skin Integrity: Goal: Risk for impaired skin integrity will decrease Outcome: Progressing   Problem: Fluid Volume: Goal: Ability to maintain a balanced intake and output will improve Outcome: Progressing   Problem: Health Behavior/Discharge Planning: Goal: Ability to identify and utilize available resources and services will improve Outcome: Progressing Goal: Ability to manage health-related needs will improve Outcome: Progressing   Problem: Metabolic: Goal: Ability to maintain appropriate glucose levels will improve Outcome: Progressing   Problem: Skin Integrity: Goal: Risk for impaired skin integrity will decrease Outcome: Progressing

## 2023-10-24 LAB — MULTIPLE MYELOMA PANEL, SERUM
Albumin SerPl Elph-Mcnc: 2.6 g/dL — ABNORMAL LOW (ref 2.9–4.4)
Albumin/Glob SerPl: 1 (ref 0.7–1.7)
Alpha 1: 0.3 g/dL (ref 0.0–0.4)
Alpha2 Glob SerPl Elph-Mcnc: 1 g/dL (ref 0.4–1.0)
B-Globulin SerPl Elph-Mcnc: 0.8 g/dL (ref 0.7–1.3)
Gamma Glob SerPl Elph-Mcnc: 0.5 g/dL (ref 0.4–1.8)
Globulin, Total: 2.7 g/dL (ref 2.2–3.9)
IgA: 231 mg/dL (ref 64–422)
IgG (Immunoglobin G), Serum: 604 mg/dL (ref 586–1602)
IgM (Immunoglobulin M), Srm: 53 mg/dL (ref 26–217)
Total Protein ELP: 5.3 g/dL — ABNORMAL LOW (ref 6.0–8.5)

## 2023-10-25 ENCOUNTER — Non-Acute Institutional Stay (SKILLED_NURSING_FACILITY): Payer: Self-pay | Admitting: Adult Health

## 2023-10-25 ENCOUNTER — Encounter: Payer: Self-pay | Admitting: Adult Health

## 2023-10-25 DIAGNOSIS — J9601 Acute respiratory failure with hypoxia: Secondary | ICD-10-CM | POA: Diagnosis not present

## 2023-10-25 DIAGNOSIS — D649 Anemia, unspecified: Secondary | ICD-10-CM

## 2023-10-25 DIAGNOSIS — E538 Deficiency of other specified B group vitamins: Secondary | ICD-10-CM

## 2023-10-25 DIAGNOSIS — J690 Pneumonitis due to inhalation of food and vomit: Secondary | ICD-10-CM

## 2023-10-25 DIAGNOSIS — E1122 Type 2 diabetes mellitus with diabetic chronic kidney disease: Secondary | ICD-10-CM

## 2023-10-25 DIAGNOSIS — F339 Major depressive disorder, recurrent, unspecified: Secondary | ICD-10-CM

## 2023-10-25 DIAGNOSIS — I129 Hypertensive chronic kidney disease with stage 1 through stage 4 chronic kidney disease, or unspecified chronic kidney disease: Secondary | ICD-10-CM

## 2023-10-25 DIAGNOSIS — E871 Hypo-osmolality and hyponatremia: Secondary | ICD-10-CM

## 2023-10-25 DIAGNOSIS — E1142 Type 2 diabetes mellitus with diabetic polyneuropathy: Secondary | ICD-10-CM

## 2023-10-25 DIAGNOSIS — E440 Moderate protein-calorie malnutrition: Secondary | ICD-10-CM

## 2023-10-25 DIAGNOSIS — N183 Chronic kidney disease, stage 3 unspecified: Secondary | ICD-10-CM

## 2023-10-25 DIAGNOSIS — E039 Hypothyroidism, unspecified: Secondary | ICD-10-CM

## 2023-10-25 DIAGNOSIS — K219 Gastro-esophageal reflux disease without esophagitis: Secondary | ICD-10-CM

## 2023-10-25 DIAGNOSIS — I700 Atherosclerosis of aorta: Secondary | ICD-10-CM

## 2023-10-25 DIAGNOSIS — I10 Essential (primary) hypertension: Secondary | ICD-10-CM

## 2023-10-25 NOTE — Progress Notes (Signed)
 Location:  [facility] Room Number: 151 Place of Service:  SNF (31)   CODE STATUS: dnr   Allergies  Allergen Reactions   Fenofibrate Other (See Comments)    Aches   Statins Itching   Sulfa Antibiotics Other (See Comments)    headahces    Chief Complaint  Patient presents with   Hospitalization Follow-up    HPI:  She is a 88 year old woman who has been hospitalized from 10-11-23 through 10-23-23. Her past medical history includes: malignant hypertension; hypothyroidism; GERD; diabetes; CKD stage 3b. She had been discharged from the hospital on 10-03-23 for e-coli UTI and hypertensive urgency. She returned to the ED with complaints of lethargy, weakness and hypoxia. She has been incontinent; has loose stools; poor appetite. She has had a productive cough with yellow sputum.  Hyponatremia: lowest level 118. She was placed on hypertonic solution. Nephrology was consulted. Her thyroid  and AM cortisol levels were normal  Acute on chronic renal failure stage 3a: baseline creatinine 1.1-1.3: peak creatinine 2.52.  the autoimmune serologies were negative. Will need to follow up with Dr. Rachele Malignant hypertension with wide pulse pressure: had cardiology evaluation. Felt to be due to arterial sclerosis with advanced age. Reasonably controlled.  Food and debris in esophagus:/aspiration pnuemonitis left upper lobe opacities: was seen by speech; regular diet thin liquids.  Anemia, acute: baseline hgb 13; hgb stable in the hospital at 8-9. Vitamin b 12 level low at 126; replacement started. Was started on iron as well.   She is here for short term rehab with her goal to return back home. She will continue to be followed for her chronic illnesses including: HTN (hypertension) malignant:  Acute respiratory failure with hypoxia/aspiration pneumonitis: Type 2 diabetes mellitus with stage 3 chronic kidney disease and hypertension:   Past Medical History:  Diagnosis Date    Hypertension    Thyroid  disease     Past Surgical History:  Procedure Laterality Date   ABDOMINAL HYSTERECTOMY     CHOLECYSTECTOMY     TONSILLECTOMY      Social History   Socioeconomic History   Marital status: Married    Spouse name: Not on file   Number of children: 3   Years of education: 12+   Highest education level: Not on file  Occupational History   Not on file  Tobacco Use   Smoking status: Never   Smokeless tobacco: Never  Vaping Use   Vaping status: Never Used  Substance and Sexual Activity   Alcohol  use: No   Drug use: No   Sexual activity: Yes    Birth control/protection: Surgical  Other Topics Concern   Not on file  Social History Narrative   Drinks 1 cup of coffee a day    Social Drivers of Corporate Investment Banker Strain: Not on file  Food Insecurity: No Food Insecurity (10/11/2023)   Hunger Vital Sign    Worried About Running Out of Food in the Last Year: Never true    Ran Out of Food in the Last Year: Never true  Transportation Needs: No Transportation Needs (10/11/2023)   PRAPARE - Administrator, Civil Service (Medical): No    Lack of Transportation (Non-Medical): No  Physical Activity: Not on file  Stress: Not on file  Social Connections: Socially Isolated (10/11/2023)   Social Connection and Isolation Panel [NHANES]    Frequency of Communication with Friends and Family: Never    Frequency of Social  Gatherings with Friends and Family: Never    Attends Religious Services: Never    Database Administrator or Organizations: No    Attends Banker Meetings: Never    Marital Status: Married  Catering Manager Violence: Not At Risk (10/11/2023)   Humiliation, Afraid, Rape, and Kick questionnaire    Fear of Current or Ex-Partner: No    Emotionally Abused: No    Physically Abused: No    Sexually Abused: No   Family History  Problem Relation Age of Onset   Brain cancer Father    Ovarian cancer Sister    Brain  cancer Sister       VITAL SIGNS BP (!) 147/54   Pulse (!) 56   Temp 98.9 F (37.2 C)   Resp 18   Ht 5' 5 (1.651 m)   Wt 165 lb 9.6 oz (75.1 kg)   SpO2 97%   BMI 27.56 kg/m   Outpatient Encounter Medications as of 10/25/2023  Medication Sig   acetaminophen  (TYLENOL ) 500 MG tablet Take 2 tablets (1,000 mg total) by mouth every 8 (eight) hours as needed for mild pain (pain score 1-3), headache or fever.   amLODipine  (NORVASC ) 10 MG tablet Take 1 tablet (10 mg total) by mouth daily.   amoxicillin -clavulanate (AUGMENTIN ) 875-125 MG tablet Take 1 tablet by mouth every 12 (twelve) hours. X 5 days   carvedilol  (COREG ) 3.125 MG tablet Take 1 tablet (3.125 mg total) by mouth 2 (two) times daily.   cloNIDine  (CATAPRES ) 0.3 MG tablet Take 1 tablet (0.3 mg total) by mouth 2 (two) times daily.   escitalopram  (LEXAPRO ) 5 MG tablet Take 1 tablet (5 mg total) by mouth daily.   ferrous sulfate  325 (65 FE) MG tablet Take 1 tablet (325 mg total) by mouth daily with breakfast.   gabapentin  (NEURONTIN ) 100 MG capsule Take 100 mg by mouth daily.   hydrALAZINE  (APRESOLINE ) 100 MG tablet Take 1 tablet (100 mg total) by mouth 3 (three) times daily.   isosorbide  mononitrate (IMDUR ) 60 MG 24 hr tablet Take 1 tablet (60 mg total) by mouth daily.   levothyroxine  (SYNTHROID , LEVOTHROID) 50 MCG tablet Take 50 mcg by mouth daily before breakfast.   Mouthwashes (MOUTH RINSE) LIQD solution 15 mLs by Mouth Rinse route as needed (for oral care).   pantoprazole  (PROTONIX ) 40 MG tablet Take 1 tablet (40 mg total) by mouth daily.   vitamin B-12 (VITAMIN B12) 500 MCG tablet Take 1 tablet (500 mcg total) by mouth daily.   No facility-administered encounter medications on file as of 10/25/2023.     SIGNIFICANT DIAGNOSTIC EXAMS  LABS REVIEWED   10-11-23: wbc 10.0; hgb 9.9; hct 29.8; mcv 89.5 plt 268; glucose 175; bun 67; creat 2.98; k+ 4.9; na++ 126; ca 8.8 gfr 21; protein 6.5; albumin  2.9; d-dimer 6.59; mag 2.5; BNP  100.5.0; iron 23; tibc 283; ferritin 127 10-12-23: vitamin B 12: 136; folate 16.6 10-15-23: wbc 9.7; hgb 8.2; hct 25.4; mcv 88.8 plt 288; glucose 119; bun 65; creat 2.31; k+ 4.6; na++ 121; ca 8.2; gfr 20; phos 4.3; albumin  2.2; cortisol AM 19.8; tsh 2.168 10-22-23: wbc 6.0; hgb 8.2; hct 26.5; mcv 91.7 plt 294; glucose 128; bun 40; creat 1.82; k+ 4.4; na++ 130; ca 9.1; gfr 27; phos 4.4 albumin  2.7   Review of Systems  Constitutional:  Negative for malaise/fatigue.  Respiratory:  Negative for cough and shortness of breath.   Cardiovascular:  Positive for leg swelling. Negative for chest pain and palpitations.  Gastrointestinal:  Negative for abdominal pain, constipation and heartburn.  Musculoskeletal:  Negative for back pain, joint pain and myalgias.  Skin: Negative.   Neurological:  Negative for dizziness.  Psychiatric/Behavioral:  The patient is not nervous/anxious.    Physical Exam Constitutional:      General: She is not in acute distress.    Appearance: She is well-developed. She is not diaphoretic.  Neck:     Thyroid : No thyromegaly.  Cardiovascular:     Rate and Rhythm: Normal rate and regular rhythm.     Pulses: Normal pulses.     Heart sounds: Normal heart sounds.  Pulmonary:     Effort: Pulmonary effort is normal. No respiratory distress.     Breath sounds: Normal breath sounds.  Abdominal:     General: Bowel sounds are normal. There is no distension.     Palpations: Abdomen is soft.     Tenderness: There is no abdominal tenderness.  Musculoskeletal:        General: Normal range of motion.     Cervical back: Neck supple.     Right lower leg: Edema present.     Left lower leg: Edema present.  Lymphadenopathy:     Cervical: No cervical adenopathy.  Skin:    General: Skin is warm and dry.  Neurological:     Mental Status: She is alert and oriented to person, place, and time.  Psychiatric:        Mood and Affect: Mood normal.       ASSESSMENT/ PLAN:  TODAY  HTN  (hypertension) malignant: has wide pulse pulse pressure: b/p 147/54: will continue the following medications: norvasc  10 mg daily; clonidine  0.3 mg twice daily coreg  3.125 mg twice daily; hydralazine  100 mg three times daily; imdur  60 mg daily   2. Acute respiratory failure with hypoxia/aspiration pneumonitis: is on 02 at this time; will attempt to wean. Will continue augmentin  875 mg twice daily for 5 days   3. Type 2 diabetes mellitus with stage 3 chronic kidney disease and hypertension: hgb A1c 6.6; will monitor   4. Stage 3 chronic kidney disease due to type 2 diabetes mellitus: bun 40; creat 1.82 gfr 27  5. Acquired hypothyroidism: tsh 2.168 will continue synthroid  50 mcg daily   6. Moderate protein calorie malnutrition: albumin  2.7 will begin prosource three times daily   7. Diabetic peripheral neuropathy: will continue gabapentin  100 mg daily   8.  GERD without esophagitis: will continue protonix  40 mg daily   9. Major depression recurrent chronic: will continue lexapro  5 mg daily   10. Vitamin B 12 deficiency: level is 136 will change to 1000 mcg daily   11. Acute anemia: hgb 8-9 during hospitalization; will continue iron daily and is on vitamin B 12 supplement as well  12. Hyponatremia: level is 130 will monitor  13. Food and debris in esophagus: is on regular diet and thin liquids.   Will check; hgb/hct and bmp    Barnie Seip NP [facility]  call [phone]

## 2023-10-26 ENCOUNTER — Non-Acute Institutional Stay (SKILLED_NURSING_FACILITY): Payer: Self-pay | Admitting: Internal Medicine

## 2023-10-26 ENCOUNTER — Encounter: Payer: Self-pay | Admitting: Internal Medicine

## 2023-10-26 DIAGNOSIS — Z794 Long term (current) use of insulin: Secondary | ICD-10-CM

## 2023-10-26 DIAGNOSIS — E1121 Type 2 diabetes mellitus with diabetic nephropathy: Secondary | ICD-10-CM | POA: Diagnosis not present

## 2023-10-26 DIAGNOSIS — J690 Pneumonitis due to inhalation of food and vomit: Secondary | ICD-10-CM | POA: Diagnosis not present

## 2023-10-26 DIAGNOSIS — N179 Acute kidney failure, unspecified: Secondary | ICD-10-CM | POA: Diagnosis not present

## 2023-10-26 DIAGNOSIS — E871 Hypo-osmolality and hyponatremia: Secondary | ICD-10-CM | POA: Insufficient documentation

## 2023-10-26 DIAGNOSIS — I10 Essential (primary) hypertension: Secondary | ICD-10-CM

## 2023-10-26 DIAGNOSIS — K219 Gastro-esophageal reflux disease without esophagitis: Secondary | ICD-10-CM | POA: Insufficient documentation

## 2023-10-26 DIAGNOSIS — E538 Deficiency of other specified B group vitamins: Secondary | ICD-10-CM | POA: Insufficient documentation

## 2023-10-26 DIAGNOSIS — E440 Moderate protein-calorie malnutrition: Secondary | ICD-10-CM

## 2023-10-26 DIAGNOSIS — N183 Chronic kidney disease, stage 3 unspecified: Secondary | ICD-10-CM | POA: Insufficient documentation

## 2023-10-26 DIAGNOSIS — F339 Major depressive disorder, recurrent, unspecified: Secondary | ICD-10-CM | POA: Insufficient documentation

## 2023-10-26 DIAGNOSIS — E46 Unspecified protein-calorie malnutrition: Secondary | ICD-10-CM | POA: Insufficient documentation

## 2023-10-26 DIAGNOSIS — E1122 Type 2 diabetes mellitus with diabetic chronic kidney disease: Secondary | ICD-10-CM | POA: Insufficient documentation

## 2023-10-26 DIAGNOSIS — I700 Atherosclerosis of aorta: Secondary | ICD-10-CM | POA: Insufficient documentation

## 2023-10-26 DIAGNOSIS — D649 Anemia, unspecified: Secondary | ICD-10-CM | POA: Insufficient documentation

## 2023-10-26 DIAGNOSIS — E1142 Type 2 diabetes mellitus with diabetic polyneuropathy: Secondary | ICD-10-CM | POA: Insufficient documentation

## 2023-10-26 NOTE — Progress Notes (Signed)
 Recheck value of A   NURSING HOME LOCATION:  [facility] ROOM NUMBER:  151 P  CODE STATUS:  DNR  PCP:  Norleen General MD  This is a comprehensive admission note to this SNFperformed on this date less than 30 days from date of admission. Included are preadmission medical/surgical history; reconciled medication list; family history; social history and comprehensive review of systems.  Corrections and additions to the records were documented. Comprehensive physical exam was also performed. Additionally a clinical summary was entered for each active diagnosis pertinent to this admission in the Problem List to enhance continuity of care.  HPI: She was hospitalized 10/11/2023 - 10/23/2023.  She also had been hospitalized and discharged on 12/22 following treatment of hypertensive urgency and an E. coli UTI. She presented 12/30 with generalized weakness, lethargy, and hypoxia.  O2 sats were 87% on room air.  O2 sats improved to 95% on 2 L nasal oxygen.  She had been essentially bed bound according to family,even urinating and defecating in bed.  She complained of anorexia, loose stool, and nausea without frank vomiting.  She also had a cough with yellow sputum production.  Chest x-ray revealed chronic interstitial markings increase but no acute process. Cardiology was consulted for wide pulse pressures; this was attributed to atherosclerotic vascular disease.  Diltiazem  was discontinued and amlodipine  initiated.   Nephrology consulted for AKI.  Initial creatinine was 2.23; creatinine peaked at 2.52.  Final creatinine was 1.44 and final GFR 36 indicating CKD stage IIIb.  The estimated GFR had ranged from a nadir of 18 up to the final value.  Initial potassium was 4.9; peak value was 5.3 and final value 4.5.  She did receive Lokelma  for the hyperkalemia.  Sodium was 126 and the hyponatremia progressed to a nadir value of 118.  She received hypertonic saline.  Hyponatremia was attributed to her  CKD.  Final sodium value was 132.  Evaluation included a cortisol which revealed a value of 19.8 and TSH of 2.168.  Glucoses ranged from low of 100 up to high of 244; she received NovoLog  SSI.  A1c was 6.6%, indicating excellent control.  Albumin  was 2.7 and total protein 5.4 documenting protein/caloric malnutrition.  Hospital course was complicated by clinical aspiration.  On 1/9 CT suggested small-moderate effusions, upper lobe nodular opacities and esophagus filled with food and debris.  White count peaked at 11,200; final value was 6000.  Unasyn  was initiated and subsequently transitioned to Augmentin  at discharge.   There was progression of anemia while hospitalized; initial H/H was 9.9/29.8; nadir values were 8.2/25.4 and final values 8.2/26.5.  B12 level was 136 and supplementation was initiated.  Iron saturation was 8%. Speech therapy consulted and advanced her to a regular diet with thin liquids. She was deemed stable enough to be discharged to SNF for rehab.  Past medical and surgical history: Includes insulin -dependent diabetes, hypothyroidism, CKD, & hx of COVID infection.  Surgeries include abdominal hysterectomy and cholecystectomy.  Family history: reviewed, non contributory due to advanced age.  It is interesting that her sister and father both had brain cancer.  Social history: Nondrinker; non smoker.  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   Review of systems: She is very knowledgeable and an excellent historian. She stated I have kidney disease and my blood pressure always runs high, 180-190.  She sees Dr. Rachele every 3 months for her CKD.  She states that she did react to the last medication when the  dose was doubled.  She validates that she was admitted with dehydration in the context of dry heaves.  She also stated that her blood pressure may have been as high as 280 systolic with a diastolic less than 30.  She made the comment that when something is done for my heart  it bothers my kidneys.  She states that I am feeling real good right now but just weak.  She describes chronic shortness of breath and some cough with minimal sputum production.  She has neuropathy in her feet.  The right lower extremity stays swollen.  She denies diabetic symptoms; she was not checking glucoses at home.  Constitutional: No fever, significant weight change  Eyes: No redness, discharge, pain, vision change ENT/mouth: No nasal congestion, purulent discharge, earache, change in hearing, sore throat  Cardiovascular: No chest pain, palpitations, paroxysmal nocturnal dyspnea, claudication  Respiratory: No hemoptysis,significant snoring, apnea  Gastrointestinal: No current heartburn, dysphagia, abdominal pain, nausea /vomiting, rectal bleeding, melena, change in bowels Genitourinary: No dysuria, hematuria, pyuria, incontinence, nocturia Musculoskeletal: No joint stiffness, joint swelling Dermatologic: No rash, pruritus, change in appearance of skin Neurologic: No dizziness, headache, syncope, seizures Psychiatric: No significant anxiety, depression, insomnia Endocrine: No change in hair/skin/nails, excessive thirst, excessive hunger, excessive urination  Hematologic/lymphatic: No significant bruising, lymphadenopathy, abnormal bleeding Allergy/immunology: No itchy/watery eyes, significant sneezing, urticaria, angioedema  Physical exam:  Pertinent or positive findings: Initially she was asleep exhibiting hypopnea without snoring or frank apnea.  She is wearing nasal oxygen.  She appears younger than her stated age; hair is dyed a dark hue.  Eyebrows are decreased in density.  Lacrimal glands are prominent.  The lower eyelids are puffy.  Facies tend to be blank and she tends to look to the side rather than at the interviewer.  She has an upper plate.  The tongue is dry.  Bilateral carotid bruits are present, greater on the left than the right.  She has bilateral low-grade rales at the  bases.  Heart sounds are distant. Pedal pulses are decreased.  1+ edema is present at the right lower extremity; there is no edema on the left.  Interosseous wasting of the hands is present.  She has diffuse irregular, slightly hyperpigmented lesions over the dorsum of the hands.  General appearance: in no acute distress, increased work of breathing is present.   Lymphatic: No lymphadenopathy about the head, neck, axilla. Eyes: No conjunctival inflammation or lid edema is present. There is no scleral icterus. Ears:  External ear exam shows no significant lesions or deformities.   Nose:  External nasal examination shows no deformity or inflammation. Nasal mucosa are pink and moist without lesions, exudates Neck:  No thyromegaly, masses, tenderness noted.    Heart:  No gallop, murmur, click, rub.  Lungs: without wheezes, rhonchi,  rubs. Abdomen: Bowel sounds are normal.  Abdomen is soft and nontender with no organomegaly, hernias, masses. GU: Deferred  Extremities:  No cyanosis, clubbing. Neurologic exam:  Balance, Rhomberg, finger to nose testing could not be completed due to clinical state Skin: Warm & dry w/o tenting. No significant rash.  See clinical summary under each active problem in the Problem List with associated updated therapeutic plan

## 2023-10-26 NOTE — Patient Instructions (Signed)
 See assessment and plan under each diagnosis in the problem list and acutely for this visit

## 2023-10-26 NOTE — Assessment & Plan Note (Signed)
 10/11/2023 creatinine 2.23; creatinine peaked at 2.52.  Final value prior to discharge was 1.44.  GFR varied from initial 21 to a nadir of 18.  Final GFR was 36 indicating CKD stage IIIb.  Med list reviewed; no change in meds or dosages at this time; continue to monitor.

## 2023-10-26 NOTE — Assessment & Plan Note (Addendum)
 While hospitalized NovoLog  SSI was employed and glucoses ranged 100-244.  A1c was 6.6% indicating good control.  No change indicated; continue to monitor  @SNF . AKI  (acutely nadir eGFR 18 or low Stage 4) in context of dehydration and profound hyponatremia exacerbated her baseline CKD . With aggressive intervention including hypertonic saline CKD returned to baseline CKD Stage 3b.

## 2023-10-26 NOTE — Assessment & Plan Note (Addendum)
 S/P IV Unasyn; she is completing the oral antibiotic course at the SNF. She remains afebrile with good O2 sats on nasal O2 @ 2L/min.

## 2023-10-26 NOTE — Assessment & Plan Note (Signed)
 Albumin 2.7 & total protein 5.4. IOW present. Nutritionist @ SNF to assess.

## 2023-10-26 NOTE — Assessment & Plan Note (Deleted)
 10/11/2023 creatinine 2.23; creatinine peaked at 2.52.  Final value prior to discharge was 1.44.  GFR varied from initial 21 to a nadir of 18.  Final GFR was 36 indicating CKD stage IIIb.  Med list reviewed; no change in meds or dosages at this time; continue to monitor.

## 2023-10-26 NOTE — Assessment & Plan Note (Signed)
 Since admission to the SNF blood pressure control has been good with only 1 systolic blood pressure above 086; this was an outlier.  No change indicated, continue to monitor.

## 2023-10-28 ENCOUNTER — Other Ambulatory Visit (HOSPITAL_COMMUNITY)
Admission: RE | Admit: 2023-10-28 | Discharge: 2023-10-28 | Disposition: A | Discharge status: Home / Self Care | Payer: Medicare HMO | Source: Skilled Nursing Facility | Attending: Adult Health | Admitting: Adult Health

## 2023-10-28 ENCOUNTER — Encounter: Payer: Self-pay | Admitting: Adult Health

## 2023-10-28 ENCOUNTER — Inpatient Hospital Stay (HOSPITAL_COMMUNITY)
Admission: EM | Admit: 2023-10-28 | Discharge: 2023-11-13 | DRG: 871 | Disposition: E | Payer: Medicare HMO | Source: Skilled Nursing Facility | Attending: Internal Medicine | Admitting: Internal Medicine

## 2023-10-28 ENCOUNTER — Non-Acute Institutional Stay (SKILLED_NURSING_FACILITY): Payer: Medicare HMO | Admitting: Adult Health

## 2023-10-28 ENCOUNTER — Emergency Department (HOSPITAL_COMMUNITY): Payer: Medicare HMO

## 2023-10-28 ENCOUNTER — Encounter (HOSPITAL_COMMUNITY): Payer: Self-pay

## 2023-10-28 ENCOUNTER — Other Ambulatory Visit: Payer: Self-pay

## 2023-10-28 DIAGNOSIS — I129 Hypertensive chronic kidney disease with stage 1 through stage 4 chronic kidney disease, or unspecified chronic kidney disease: Secondary | ICD-10-CM | POA: Diagnosis present

## 2023-10-28 DIAGNOSIS — Z8041 Family history of malignant neoplasm of ovary: Secondary | ICD-10-CM

## 2023-10-28 DIAGNOSIS — Z66 Do not resuscitate: Secondary | ICD-10-CM | POA: Diagnosis present

## 2023-10-28 DIAGNOSIS — Z888 Allergy status to other drugs, medicaments and biological substances status: Secondary | ICD-10-CM

## 2023-10-28 DIAGNOSIS — J690 Pneumonitis due to inhalation of food and vomit: Secondary | ICD-10-CM

## 2023-10-28 DIAGNOSIS — N183 Chronic kidney disease, stage 3 unspecified: Secondary | ICD-10-CM | POA: Diagnosis present

## 2023-10-28 DIAGNOSIS — J189 Pneumonia, unspecified organism: Secondary | ICD-10-CM | POA: Diagnosis present

## 2023-10-28 DIAGNOSIS — Z7951 Long term (current) use of inhaled steroids: Secondary | ICD-10-CM | POA: Diagnosis not present

## 2023-10-28 DIAGNOSIS — Z79899 Other long term (current) drug therapy: Secondary | ICD-10-CM | POA: Diagnosis not present

## 2023-10-28 DIAGNOSIS — E872 Acidosis, unspecified: Secondary | ICD-10-CM | POA: Diagnosis present

## 2023-10-28 DIAGNOSIS — J9601 Acute respiratory failure with hypoxia: Secondary | ICD-10-CM

## 2023-10-28 DIAGNOSIS — A419 Sepsis, unspecified organism: Principal | ICD-10-CM | POA: Diagnosis present

## 2023-10-28 DIAGNOSIS — Z794 Long term (current) use of insulin: Secondary | ICD-10-CM | POA: Diagnosis not present

## 2023-10-28 DIAGNOSIS — Z515 Encounter for palliative care: Secondary | ICD-10-CM

## 2023-10-28 DIAGNOSIS — Z882 Allergy status to sulfonamides status: Secondary | ICD-10-CM | POA: Diagnosis not present

## 2023-10-28 DIAGNOSIS — E1121 Type 2 diabetes mellitus with diabetic nephropathy: Secondary | ICD-10-CM

## 2023-10-28 DIAGNOSIS — G9341 Metabolic encephalopathy: Secondary | ICD-10-CM | POA: Insufficient documentation

## 2023-10-28 DIAGNOSIS — Z808 Family history of malignant neoplasm of other organs or systems: Secondary | ICD-10-CM | POA: Diagnosis not present

## 2023-10-28 DIAGNOSIS — R6520 Severe sepsis without septic shock: Secondary | ICD-10-CM | POA: Diagnosis present

## 2023-10-28 DIAGNOSIS — Y95 Nosocomial condition: Secondary | ICD-10-CM | POA: Diagnosis present

## 2023-10-28 DIAGNOSIS — Z90710 Acquired absence of both cervix and uterus: Secondary | ICD-10-CM | POA: Diagnosis not present

## 2023-10-28 DIAGNOSIS — Z1152 Encounter for screening for COVID-19: Secondary | ICD-10-CM

## 2023-10-28 DIAGNOSIS — E1122 Type 2 diabetes mellitus with diabetic chronic kidney disease: Secondary | ICD-10-CM | POA: Diagnosis present

## 2023-10-28 DIAGNOSIS — E039 Hypothyroidism, unspecified: Secondary | ICD-10-CM | POA: Diagnosis present

## 2023-10-28 DIAGNOSIS — N1832 Chronic kidney disease, stage 3b: Secondary | ICD-10-CM | POA: Diagnosis present

## 2023-10-28 DIAGNOSIS — Z79890 Hormone replacement therapy (postmenopausal): Secondary | ICD-10-CM | POA: Diagnosis not present

## 2023-10-28 DIAGNOSIS — I1310 Hypertensive heart and chronic kidney disease without heart failure, with stage 1 through stage 4 chronic kidney disease, or unspecified chronic kidney disease: Secondary | ICD-10-CM | POA: Insufficient documentation

## 2023-10-28 DIAGNOSIS — N179 Acute kidney failure, unspecified: Secondary | ICD-10-CM | POA: Diagnosis present

## 2023-10-28 LAB — COMPREHENSIVE METABOLIC PANEL
ALT: 13 U/L (ref 0–44)
AST: 26 U/L (ref 15–41)
Albumin: 3.5 g/dL (ref 3.5–5.0)
Alkaline Phosphatase: 81 U/L (ref 38–126)
Anion gap: 14 (ref 5–15)
BUN: 31 mg/dL — ABNORMAL HIGH (ref 8–23)
CO2: 15 mmol/L — ABNORMAL LOW (ref 22–32)
Calcium: 9.5 mg/dL (ref 8.9–10.3)
Chloride: 107 mmol/L (ref 98–111)
Creatinine, Ser: 1.28 mg/dL — ABNORMAL HIGH (ref 0.44–1.00)
GFR, Estimated: 41 mL/min — ABNORMAL LOW (ref >60)
Glucose, Bld: 161 mg/dL — ABNORMAL HIGH (ref 70–99)
Potassium: 4.8 mmol/L (ref 3.5–5.1)
Sodium: 136 mmol/L (ref 135–145)
Total Bilirubin: 1.7 mg/dL — ABNORMAL HIGH (ref 0.0–1.2)
Total Protein: 7.3 g/dL (ref 6.5–8.1)

## 2023-10-28 LAB — CBC WITH DIFFERENTIAL/PLATELET
Abs Immature Granulocytes: 0.08 10*3/uL — ABNORMAL HIGH (ref 0.00–0.07)
Abs Immature Granulocytes: 0.1 10*3/uL — ABNORMAL HIGH (ref 0.00–0.07)
Basophils Absolute: 0.1 10*3/uL (ref 0.0–0.1)
Basophils Absolute: 0.1 10*3/uL (ref 0.0–0.1)
Basophils Relative: 1 %
Basophils Relative: 0 %
Eosinophils Absolute: 0 10*3/uL (ref 0.0–0.5)
Eosinophils Absolute: 0 10*3/uL (ref 0.0–0.5)
Eosinophils Relative: 0 %
Eosinophils Relative: 0 %
HCT: 34.8 % — ABNORMAL LOW (ref 36.0–46.0)
HCT: 35.1 % — ABNORMAL LOW (ref 36.0–46.0)
Hemoglobin: 11.2 g/dL — ABNORMAL LOW (ref 12.0–15.0)
Hemoglobin: 11.4 g/dL — ABNORMAL LOW (ref 12.0–15.0)
Immature Granulocytes: 1 %
Immature Granulocytes: 1 %
Lymphocytes Relative: 4 %
Lymphocytes Relative: 4 %
Lymphs Abs: 0.6 10*3/uL — ABNORMAL LOW (ref 0.7–4.0)
Lymphs Abs: 0.5 10*3/uL — ABNORMAL LOW (ref 0.7–4.0)
MCH: 29.2 pg (ref 26.0–34.0)
MCH: 29.5 pg (ref 26.0–34.0)
MCHC: 32.2 g/dL (ref 30.0–36.0)
MCHC: 32.5 g/dL (ref 30.0–36.0)
MCV: 90.9 fL (ref 80.0–100.0)
MCV: 90.9 fL (ref 80.0–100.0)
Monocytes Absolute: 1.3 10*3/uL — ABNORMAL HIGH (ref 0.1–1.0)
Monocytes Absolute: 1 10*3/uL (ref 0.1–1.0)
Monocytes Relative: 10 %
Monocytes Relative: 7 %
Neutro Abs: 11.4 10*3/uL — ABNORMAL HIGH (ref 1.7–7.7)
Neutro Abs: 12.3 10*3/uL — ABNORMAL HIGH (ref 1.7–7.7)
Neutrophils Relative %: 84 %
Neutrophils Relative %: 88 %
Platelets: 346 10*3/uL (ref 150–400)
Platelets: 344 10*3/uL (ref 150–400)
RBC: 3.83 MIL/uL — ABNORMAL LOW (ref 3.87–5.11)
RBC: 3.86 MIL/uL — ABNORMAL LOW (ref 3.87–5.11)
RDW: 14.7 % (ref 11.5–15.5)
RDW: 14.7 % (ref 11.5–15.5)
WBC: 13.4 10*3/uL — ABNORMAL HIGH (ref 4.0–10.5)
WBC: 14 10*3/uL — ABNORMAL HIGH (ref 4.0–10.5)
nRBC: 0 % (ref 0.0–0.2)
nRBC: 0 % (ref 0.0–0.2)

## 2023-10-28 LAB — D-DIMER, QUANTITATIVE: D-Dimer, Quant: 3.72 ug{FEU}/mL — ABNORMAL HIGH (ref 0.00–0.50)

## 2023-10-28 LAB — HEPATIC FUNCTION PANEL
ALT: 10 U/L (ref 0–44)
AST: 17 U/L (ref 15–41)
Albumin: 3.5 g/dL (ref 3.5–5.0)
Alkaline Phosphatase: 81 U/L (ref 38–126)
Bilirubin, Direct: 0.2 mg/dL (ref 0.0–0.2)
Indirect Bilirubin: 1 mg/dL — ABNORMAL HIGH (ref 0.3–0.9)
Total Bilirubin: 1.2 mg/dL (ref 0.0–1.2)
Total Protein: 7.2 g/dL (ref 6.5–8.1)

## 2023-10-28 LAB — BASIC METABOLIC PANEL
Anion gap: 13 (ref 5–15)
BUN: 28 mg/dL — ABNORMAL HIGH (ref 8–23)
CO2: 16 mmol/L — ABNORMAL LOW (ref 22–32)
Calcium: 9.5 mg/dL (ref 8.9–10.3)
Chloride: 106 mmol/L (ref 98–111)
Creatinine, Ser: 1.24 mg/dL — ABNORMAL HIGH (ref 0.44–1.00)
GFR, Estimated: 43 mL/min — ABNORMAL LOW (ref >60)
Glucose, Bld: 143 mg/dL — ABNORMAL HIGH (ref 70–99)
Potassium: 4 mmol/L (ref 3.5–5.1)
Sodium: 135 mmol/L (ref 135–145)

## 2023-10-28 LAB — BLOOD GAS, VENOUS
Acid-base deficit: 6.1 mmol/L — ABNORMAL HIGH (ref 0.0–2.0)
Bicarbonate: 17.3 mmol/L — ABNORMAL LOW (ref 20.0–28.0)
Drawn by: 70829
O2 Saturation: 90.3 %
Patient temperature: 36.6
pCO2, Ven: 27 mm[Hg] — ABNORMAL LOW (ref 44–60)
pH, Ven: 7.41 (ref 7.25–7.43)
pO2, Ven: 54 mm[Hg] — ABNORMAL HIGH (ref 32–45)

## 2023-10-28 LAB — RESP PANEL BY RT-PCR (RSV, FLU A&B, COVID)  RVPGX2
Influenza A by PCR: NEGATIVE
Influenza B by PCR: NEGATIVE
Resp Syncytial Virus by PCR: NEGATIVE
SARS Coronavirus 2 by RT PCR: NEGATIVE

## 2023-10-28 LAB — BLOOD GAS, ARTERIAL
Acid-base deficit: 6.1 mmol/L — ABNORMAL HIGH (ref 0.0–2.0)
Bicarbonate: 17.3 mmol/L — ABNORMAL LOW (ref 20.0–28.0)
Drawn by: 70829
O2 Saturation: 90.3 %
Patient temperature: 36.6
pCO2 arterial: 27 mm[Hg] — ABNORMAL LOW (ref 32–48)
pH, Arterial: 7.41 (ref 7.35–7.45)
pO2, Arterial: 54 mm[Hg] — ABNORMAL LOW (ref 83–108)

## 2023-10-28 LAB — MRSA NEXT GEN BY PCR, NASAL: MRSA by PCR Next Gen: NOT DETECTED

## 2023-10-28 LAB — HEMOGLOBIN AND HEMATOCRIT, BLOOD
HCT: 35.6 % — ABNORMAL LOW (ref 36.0–46.0)
Hemoglobin: 11.1 g/dL — ABNORMAL LOW (ref 12.0–15.0)

## 2023-10-28 LAB — HIV ANTIBODY (ROUTINE TESTING W REFLEX): HIV Screen 4th Generation wRfx: NONREACTIVE

## 2023-10-28 LAB — PROTIME-INR
INR: 1.3 — ABNORMAL HIGH (ref 0.8–1.2)
Prothrombin Time: 15.9 s — ABNORMAL HIGH (ref 11.4–15.2)

## 2023-10-28 LAB — LACTIC ACID, PLASMA
Lactic Acid, Venous: 1.8 mmol/L (ref 0.5–1.9)
Lactic Acid, Venous: 1.9 mmol/L (ref 0.5–1.9)
Lactic Acid, Venous: 1.6 mmol/L (ref 0.5–1.9)

## 2023-10-28 LAB — C-REACTIVE PROTEIN: CRP: 3.2 mg/dL — ABNORMAL HIGH (ref <1.0)

## 2023-10-28 MED ORDER — METHYLPREDNISOLONE SODIUM SUCC 40 MG IJ SOLR: 40.0000 mg | INTRAMUSCULAR | Status: DC

## 2023-10-28 MED ORDER — ONDANSETRON HCL 4 MG PO TABS: 4.0000 mg | ORAL_TABLET | Freq: Four times a day (QID) | ORAL | Status: DC | PRN

## 2023-10-28 MED ORDER — DICLOFENAC SODIUM 1 % EX GEL
2.0000 g | Freq: Four times a day (QID) | CUTANEOUS | Status: DC | PRN
Start: 2023-10-28 — End: 2023-10-31

## 2023-10-28 MED ORDER — CARVEDILOL 3.125 MG PO TABS
3.1250 mg | ORAL_TABLET | Freq: Two times a day (BID) | ORAL | Status: DC
Start: 2023-10-28 — End: 2023-10-29
  Filled 2023-10-28: qty 1

## 2023-10-28 MED ORDER — SODIUM CHLORIDE 0.9 % IV SOLN
2.0000 g | INTRAVENOUS | Status: DC
Start: 2023-10-29 — End: 2023-10-29
  Administered 2023-10-29: 2 g via INTRAVENOUS
  Filled 2023-10-28: qty 12.5

## 2023-10-28 MED ORDER — ACETAMINOPHEN 325 MG PO TABS
650.0000 mg | ORAL_TABLET | Freq: Four times a day (QID) | ORAL | Status: DC | PRN
  Administered 2023-10-28: 650 mg via ORAL
  Filled 2023-10-28: qty 2

## 2023-10-28 MED ORDER — SODIUM CHLORIDE 0.9 % IV SOLN
500.0000 mg | INTRAVENOUS | Status: DC
  Administered 2023-10-29: 500 mg via INTRAVENOUS
  Filled 2023-10-28: qty 5

## 2023-10-28 MED ORDER — MELATONIN 3 MG PO TABS
6.0000 mg | ORAL_TABLET | Freq: Every day | ORAL | Status: DC
  Administered 2023-10-28: 6 mg via ORAL
  Filled 2023-10-28: qty 2

## 2023-10-28 MED ORDER — METHYLPREDNISOLONE SODIUM SUCC 125 MG IJ SOLR: 80.0000 mg | Freq: Two times a day (BID) | INTRAMUSCULAR | Status: DC

## 2023-10-28 MED ORDER — TRAZODONE HCL 50 MG PO TABS
50.0000 mg | ORAL_TABLET | Freq: Once | ORAL | Status: AC
  Administered 2023-10-28: 50 mg via ORAL
  Filled 2023-10-28: qty 1

## 2023-10-28 MED ORDER — SODIUM CHLORIDE 0.9 % IV SOLN: 2.0000 g | Freq: Once | INTRAVENOUS | Status: DC

## 2023-10-28 MED ORDER — LEVOTHYROXINE SODIUM 25 MCG PO TABS: 50.0000 ug | ORAL_TABLET | Freq: Every day | ORAL | Status: DC

## 2023-10-28 MED ORDER — SODIUM CHLORIDE 0.9 % IV SOLN
500.0000 mg | INTRAVENOUS | Status: DC
  Administered 2023-10-28: 500 mg via INTRAVENOUS
  Filled 2023-10-28: qty 5

## 2023-10-28 MED ORDER — ESCITALOPRAM OXALATE 10 MG PO TABS
5.0000 mg | ORAL_TABLET | Freq: Every day | ORAL | Status: DC
  Filled 2023-10-28: qty 1

## 2023-10-28 MED ORDER — ALBUTEROL SULFATE (2.5 MG/3ML) 0.083% IN NEBU
2.5000 mg | INHALATION_SOLUTION | RESPIRATORY_TRACT | Status: DC
  Administered 2023-10-28 – 2023-10-29 (×6): 2.5 mg via RESPIRATORY_TRACT
  Filled 2023-10-28 (×6): qty 3

## 2023-10-28 MED ORDER — SODIUM CHLORIDE 0.45 % IV SOLN: INTRAVENOUS | Status: DC

## 2023-10-28 MED ORDER — PANTOPRAZOLE SODIUM 40 MG PO TBEC
40.0000 mg | DELAYED_RELEASE_TABLET | Freq: Every day | ORAL | Status: DC
  Filled 2023-10-28: qty 1

## 2023-10-28 MED ORDER — ACETAMINOPHEN 500 MG PO TABS: 1000.0000 mg | ORAL_TABLET | Freq: Three times a day (TID) | ORAL | Status: DC | PRN

## 2023-10-28 MED ORDER — SODIUM CHLORIDE 0.9 % IV SOLN: 500.0000 mg | INTRAVENOUS | Status: DC

## 2023-10-28 MED ORDER — ONDANSETRON HCL 4 MG/2ML IJ SOLN
4.0000 mg | Freq: Four times a day (QID) | INTRAMUSCULAR | Status: DC | PRN
  Administered 2023-10-29: 4 mg via INTRAVENOUS
  Filled 2023-10-28: qty 2

## 2023-10-28 MED ORDER — VANCOMYCIN HCL 1250 MG/250ML IV SOLN: 1250.0000 mg | INTRAVENOUS | Status: DC

## 2023-10-28 MED ORDER — ENOXAPARIN SODIUM 30 MG/0.3ML IJ SOSY
30.0000 mg | PREFILLED_SYRINGE | INTRAMUSCULAR | Status: DC
  Administered 2023-10-28: 30 mg via SUBCUTANEOUS
  Filled 2023-10-28: qty 0.3

## 2023-10-28 MED ORDER — GABAPENTIN 100 MG PO CAPS
100.0000 mg | ORAL_CAPSULE | Freq: Every day | ORAL | Status: DC
  Filled 2023-10-28: qty 1

## 2023-10-28 MED ORDER — SODIUM CHLORIDE 0.9 % IV SOLN
2.0000 g | Freq: Once | INTRAVENOUS | Status: AC
  Administered 2023-10-28: 2 g via INTRAVENOUS
  Filled 2023-10-28: qty 12.5

## 2023-10-28 MED ORDER — POLYETHYLENE GLYCOL 3350 17 G PO PACK
17.0000 g | PACK | Freq: Every day | ORAL | Status: AC
  Filled 2023-10-28: qty 1

## 2023-10-28 MED ORDER — ACETAMINOPHEN 650 MG RE SUPP: 650.0000 mg | Freq: Four times a day (QID) | RECTAL | Status: DC | PRN

## 2023-10-28 MED ORDER — METHYLPREDNISOLONE SODIUM SUCC 125 MG IJ SOLR
80.0000 mg | Freq: Two times a day (BID) | INTRAMUSCULAR | Status: AC
  Administered 2023-10-28 – 2023-10-29 (×2): 80 mg via INTRAVENOUS
  Filled 2023-10-28 (×2): qty 2

## 2023-10-28 MED ORDER — VANCOMYCIN HCL 1500 MG/300ML IV SOLN
1500.0000 mg | Freq: Once | INTRAVENOUS | Status: AC
  Administered 2023-10-28: 1500 mg via INTRAVENOUS
  Filled 2023-10-28: qty 300

## 2023-10-28 NOTE — ED Notes (Signed)
Son at bedside. Update given on pt and results pending. Pt resting comfortably on bipap at this time. O2 sat 98% at this time.

## 2023-10-28 NOTE — Progress Notes (Signed)
   10/28/23 1300  BiPAP/CPAP/SIPAP  $ Non-Invasive Ventilator  Non-Invasive Vent Set Up;Non-Invasive Vent Initial  $ Face Mask Medium Yes  BiPAP/CPAP/SIPAP Pt Type Adult  BiPAP/CPAP/SIPAP SERVO  Mask Type Full face mask  Mask Size Medium  Set Rate 20 breaths/min  Respiratory Rate 33 breaths/min  IPAP 10 cmH20  PEEP 5 cmH20  FiO2 (%) 100 %  Minute Ventilation 19.4  Leak 10  Peak Inspiratory Pressure (PIP) 18  Tidal Volume (Vt) 585  Patient Home Equipment No  Auto Titrate No  Press High Alarm 25 cmH2O

## 2023-10-28 NOTE — Progress Notes (Signed)
Location:  [facility] Room Number: 151 Place of Service:  SNF (31)   CODE STATUS: dnr   Allergies  Allergen Reactions   Fenofibrate Other (See Comments)    Aches   Statins Itching   Sulfa Antibiotics Other (See Comments)    headahces    Chief Complaint  Patient presents with   Acute Visit    Change in status.     HPI:  This AM she is having increased shortness of breath requiring 4 liters of 02 to maintain 02 sat in 80's. She is in respiratory distress. She has rhonchi; rales; wheezing present. She has pink frothy sputum present. She is pale. There are no reports of fevers present.   Past Medical History:  Diagnosis Date   Hypertension    Thyroid disease     Past Surgical History:  Procedure Laterality Date   ABDOMINAL HYSTERECTOMY     CHOLECYSTECTOMY     TONSILLECTOMY      Social History   Socioeconomic History   Marital status: Married    Spouse name: Not on file   Number of children: 3   Years of education: 12+   Highest education level: Not on file  Occupational History   Not on file  Tobacco Use   Smoking status: Never   Smokeless tobacco: Never  Vaping Use   Vaping status: Never Used  Substance and Sexual Activity   Alcohol use: No   Drug use: No   Sexual activity: Yes    Birth control/protection: Surgical  Other Topics Concern   Not on file  Social History Narrative   Drinks 1 cup of coffee a day    Social Drivers of Corporate investment banker Strain: Not on file  Food Insecurity: No Food Insecurity (10/11/2023)   Hunger Vital Sign    Worried About Running Out of Food in the Last Year: Never true    Ran Out of Food in the Last Year: Never true  Transportation Needs: No Transportation Needs (10/11/2023)   PRAPARE - Administrator, Civil Service (Medical): No    Lack of Transportation (Non-Medical): No  Physical Activity: Not on file  Stress: Not on file  Social Connections: Socially Isolated  (10/11/2023)   Social Connection and Isolation Panel [NHANES]    Frequency of Communication with Friends and Family: Never    Frequency of Social Gatherings with Friends and Family: Never    Attends Religious Services: Never    Database administrator or Organizations: No    Attends Banker Meetings: Never    Marital Status: Married  Catering manager Violence: Not At Risk (10/11/2023)   Humiliation, Afraid, Rape, and Kick questionnaire    Fear of Current or Ex-Partner: No    Emotionally Abused: No    Physically Abused: No    Sexually Abused: No   Family History  Problem Relation Age of Onset   Brain cancer Father    Ovarian cancer Sister    Brain cancer Sister       VITAL SIGNS BP 138/80   Pulse 88   Temp 97.9 F (36.6 C)   Resp 18   Ht 5\' 5"  (1.651 m)   Wt 166 lb (75.3 kg)   SpO2 (!) 78%   PF (!) 4 L/min   BMI 27.62 kg/m   Outpatient Encounter Medications as of 10/28/2023  Medication Sig   acetaminophen (TYLENOL) 500 MG tablet Take 2 tablets (1,000 mg  total) by mouth every 8 (eight) hours as needed for mild pain (pain score 1-3), headache or fever.   amLODipine (NORVASC) 10 MG tablet Take 1 tablet (10 mg total) by mouth daily.   amoxicillin-clavulanate (AUGMENTIN) 875-125 MG tablet Take 1 tablet by mouth every 12 (twelve) hours. X 5 days   carvedilol (COREG) 3.125 MG tablet Take 1 tablet (3.125 mg total) by mouth 2 (two) times daily.   cloNIDine (CATAPRES) 0.3 MG tablet Take 1 tablet (0.3 mg total) by mouth 2 (two) times daily.   cyanocobalamin 1000 MCG tablet Take 1,000 mcg by mouth daily.   escitalopram (LEXAPRO) 5 MG tablet Take 1 tablet (5 mg total) by mouth daily.   ferrous sulfate 325 (65 FE) MG tablet Take 1 tablet (325 mg total) by mouth daily with breakfast.   gabapentin (NEURONTIN) 100 MG capsule Take 100 mg by mouth daily.   hydrALAZINE (APRESOLINE) 100 MG tablet Take 1 tablet (100 mg total) by mouth 3 (three) times daily.   isosorbide  mononitrate (IMDUR) 60 MG 24 hr tablet Take 1 tablet (60 mg total) by mouth daily.   levothyroxine (SYNTHROID, LEVOTHROID) 50 MCG tablet Take 50 mcg by mouth daily before breakfast.   Mouthwashes (MOUTH RINSE) LIQD solution 15 mLs by Mouth Rinse route as needed (for oral care).   Nutritional Supplements (PROSOURCE) LIQD Take 30 mLs by mouth 3 (three) times daily.   pantoprazole (PROTONIX) 40 MG tablet Take 1 tablet (40 mg total) by mouth daily.   No facility-administered encounter medications on file as of 10/28/2023.     SIGNIFICANT DIAGNOSTIC EXAMS  LABS REVIEWED   10-11-23: wbc 10.0; hgb 9.9; hct 29.8; mcv 89.5 plt 268; glucose 175; bun 67; creat 2.98; k+ 4.9; na++ 126; ca 8.8 gfr 21; protein 6.5; albumin 2.9; d-dimer 6.59; mag 2.5; BNP 100.5.0; iron 23; tibc 283; ferritin 127 10-12-23: vitamin B 12: 136; folate 16.6 10-15-23: wbc 9.7; hgb 8.2; hct 25.4; mcv 88.8 plt 288; glucose 119; bun 65; creat 2.31; k+ 4.6; na++ 121; ca 8.2; gfr 20; phos 4.3; albumin 2.2; cortisol AM 19.8; tsh 2.168 10-22-23: wbc 6.0; hgb 8.2; hct 26.5; mcv 91.7 plt 294; glucose 128; bun 40; creat 1.82; k+ 4.4; na++ 130; ca 9.1; gfr 27; phos 4.4 albumin 2.7   Review of Systems  Unable to perform ROS: Medical condition  Constitutional:  Negative for malaise/fatigue.  Respiratory:  Positive for cough, hemoptysis, sputum production, shortness of breath and wheezing.   Cardiovascular:  Negative for chest pain, palpitations and leg swelling.  Gastrointestinal:  Negative for abdominal pain, constipation and heartburn.  Musculoskeletal:  Negative for back pain, joint pain and myalgias.  Skin: Negative.   Neurological:  Negative for dizziness.  Psychiatric/Behavioral:  The patient is not nervous/anxious.    Physical Exam Constitutional:      General: She is in acute distress.     Appearance: She is well-developed. She is ill-appearing and diaphoretic.  Neck:     Thyroid: No thyromegaly.  Cardiovascular:     Rate and  Rhythm: Normal rate and regular rhythm.     Heart sounds: Normal heart sounds.  Pulmonary:     Effort: Respiratory distress present.     Breath sounds: Wheezing, rhonchi and rales present.     Comments: Increased effort  Abdominal:     General: Bowel sounds are normal. There is no distension.     Palpations: Abdomen is soft.     Tenderness: There is no abdominal tenderness.  Musculoskeletal:  General: Normal range of motion.     Cervical back: Neck supple.     Right lower leg: No edema.     Left lower leg: No edema.  Lymphadenopathy:     Cervical: No cervical adenopathy.  Skin:    General: Skin is warm.  Neurological:     Mental Status: She is alert and oriented to person, place, and time.     ASSESSMENT/ PLAN:  TODAY  Acute respiratory distress with hypoxia Aspiration pneumonitis  I suspect that she has aspirated once again Will give lasix 40 mg now;  Prednisone 60 mg now then 40 mg for 2 days then 20 mg for 2 days Duoneb every 6 hours through 11-01-23 Zithromax 500 mg daily through 11-01-23.    Her status has changed and is unable to maintain her 02 sats will send to the ED for further evaluation.    Sherry Innocent NP [facility]   call [phone]

## 2023-10-28 NOTE — Progress Notes (Signed)
PHARMACY NOTE:  ANTIMICROBIAL RENAL DOSAGE ADJUSTMENT  Current antimicrobial regimen includes a mismatch between antimicrobial dosage and estimated renal function.  As per policy approved by the Pharmacy & Therapeutics and Medical Executive Committees, the antimicrobial dosage will be adjusted accordingly.  Current antimicrobial dosage: Cefepime 2 g IV q8h  Indication: Pneumonia  Renal Function:  Estimated Creatinine Clearance: 28.9 mL/min (A) (by C-G formula based on SCr of 1.28 mg/dL (H)).    Antimicrobial dosage has been changed to: Cefepime 2 g IV q24h  Additional comments: Continue to monitor renal fx and adjust dose as indicated   Thank you for allowing pharmacy to be a part of this patient's care.  Tressie Ellis, [facility] 10/28/2023 5:16 PM

## 2023-10-28 NOTE — Progress Notes (Signed)
PHARMACIST - PHYSICIAN COMMUNICATION  CONCERNING:  Enoxaparin (Lovenox) for DVT Prophylaxis    RECOMMENDATION: Patient was prescribed enoxaparin 40mg  q24 hours for VTE prophylaxis.   Filed Weights   10/28/23 1300 10/28/23 1407  Weight: 75.3 kg (166 lb) 68 kg (150 lb)    Body mass index is 24.96 kg/m.  Estimated Creatinine Clearance: 28.9 mL/min (A) (by C-G formula based on SCr of 1.28 mg/dL (H)).  Patient is candidate for enoxaparin 30mg  every 24 hours based on CrCl <98ml/min or Weight <45kg  DESCRIPTION: Pharmacy has adjusted enoxaparin dose per [facility] policy.  Patient is now receiving enoxaparin 30 mg every 24 hours   Tressie Ellis 10/28/2023 5:14 PM

## 2023-10-28 NOTE — ED Triage Notes (Signed)
Pt brought in by [facility] for respiratory distress. Pt at [facility]. Pt given 2 albuterol treatments PTA. Pt O2 sats 74-76% by EMS placed on NRB at 15L sats increased to 92%. Pt at [facility] for aspiration. BP 160/79, HR 106, RR 38. Pt A&O x4.

## 2023-10-28 NOTE — ED Provider Notes (Signed)
[place] EMERGENCY DEPARTMENT AT [facility] Provider Note  CSN: [id number] Arrival date & time: 10/28/23 1240  Chief Complaint(s) Respiratory Distress  HPI Sherry Waller is a 88 y.o. female history of diabetes, CKD, hypertension, recent admission for worsening CKD, pneumonia thought to be due to aspiration presenting with respiratory failure.  Patient reports that she feels very short of breath.  She endorses cough.  She is not able to provide much additional history as she is on BiPAP.  Family at bedside report that she seems to be doing a little bit better since leaving the nursing facility, had been up and walking yesterday, although not very far.  Today the patient had increased confusion and weakness, paramedics were called, patient was found to be 74 to 76% and placed on nonrebreather, improved to 92%.  BiPAP was placed on arrival to the emergency department.  History limited due to respiratory distress   Past Medical History Past Medical History:  Diagnosis Date   Hypertension    Thyroid disease    Patient Active Problem List   Diagnosis Date Noted   Acute metabolic encephalopathy 10/28/2023   Normal anion gap metabolic acidosis 10/28/2023   Hospital-acquired pneumonia 10/28/2023   Diabetes mellitus with diabetic nephropathy, with long-term current use of insulin (HCC) 10/26/2023   Unspecified protein-calorie malnutrition (HCC) 10/26/2023   Type 2 diabetes mellitus with stage 3 chronic kidney disease and hypertension (HCC) 10/26/2023   CKD stage 3 due to type 2 diabetes mellitus (HCC) 10/26/2023   Diabetic peripheral neuropathy (HCC) 10/26/2023   GERD without esophagitis 10/26/2023   Major depression, recurrent, chronic (HCC) 10/26/2023   Vitamin B 12 deficiency 10/26/2023   Acute anemia 10/26/2023   Hyponatremia 10/26/2023   Aortic atherosclerosis (HCC) 10/26/2023   Aspiration pneumonitis (HCC) 10/21/2023   AKI (acute kidney injury) (HCC) 10/12/2023    Acute respiratory failure with hypoxia (HCC) 10/11/2023   HTN (hypertension), malignant 10/11/2023   Hypothyroidism 10/11/2023   CKD (chronic kidney disease) stage 3B- 10/11/2023   Hypertensive urgency 10/01/2023   Home Medication(s) Prior to Admission medications   Medication Sig Start Date End Date Taking? Authorizing Provider  acetaminophen (TYLENOL) 500 MG tablet Take 2 tablets (1,000 mg total) by mouth every 8 (eight) hours as needed for mild pain (pain score 1-3), headache or fever. 10/03/23  Yes Vassie Loll, MD  amLODipine (NORVASC) 10 MG tablet Take 1 tablet (10 mg total) by mouth daily. 10/20/23  Yes Shahmehdi, Seyed A, MD  amoxicillin-clavulanate (AUGMENTIN) 875-125 MG tablet Take 1 tablet by mouth every 12 (twelve) hours. X 5 days 10/23/23  Yes Tat, Onalee Hua, MD  azithromycin (ZITHROMAX) 500 MG tablet Take 500 mg by mouth daily. 10/28/23 11/01/23 Yes [provider]  carvedilol (COREG) 3.125 MG tablet Take 1 tablet (3.125 mg total) by mouth 2 (two) times daily. 10/03/23  Yes Vassie Loll, MD  cloNIDine (CATAPRES) 0.3 MG tablet Take 1 tablet (0.3 mg total) by mouth 2 (two) times daily. 10/23/23  Yes Tat, Onalee Hua, MD  cyanocobalamin (VITAMIN B12) 1000 MCG tablet Take 1,000 mcg by mouth daily.   Yes [provider]  escitalopram (LEXAPRO) 5 MG tablet Take 1 tablet (5 mg total) by mouth daily. 10/20/23  Yes Shahmehdi, Seyed A, MD  ferrous sulfate 325 (65 FE) MG tablet Take 325 mg by mouth daily with breakfast.   Yes [provider]  furosemide (LASIX) 40 MG tablet Take 40 mg by mouth once.   Yes [provider]  gabapentin (NEURONTIN)  100 MG capsule Take 100 mg by mouth daily.   Yes [provider]  hydrALAZINE (APRESOLINE) 100 MG tablet Take 1 tablet (100 mg total) by mouth 3 (three) times daily. 10/23/23  Yes Tat, Onalee Hua, MD  ipratropium-albuterol (DUONEB) 0.5-2.5 (3) MG/3ML SOLN Take 3 mLs by nebulization every 6 (six) hours. 10/28/23 11/01/23 Yes  [provider]  levothyroxine (SYNTHROID, LEVOTHROID) 50 MCG tablet Take 50 mcg by mouth daily before breakfast.   Yes [provider]  Nutritional Supplements (PROSOURCE) LIQD Take 30 mLs by mouth 3 (three) times daily.   Yes [provider]  pantoprazole (PROTONIX) 40 MG tablet Take 1 tablet (40 mg total) by mouth daily. 10/04/23  Yes Vassie Loll, MD  predniSONE (DELTASONE) 20 MG tablet Take 60 mg by mouth once.   Yes [provider]                                                                                                                                    Past Surgical History Past Surgical History:  Procedure Laterality Date   ABDOMINAL HYSTERECTOMY     CHOLECYSTECTOMY     TONSILLECTOMY     Family History Family History  Problem Relation Age of Onset   Brain cancer Father    Ovarian cancer Sister    Brain cancer Sister     Social History Social History   Tobacco Use   Smoking status: Never   Smokeless tobacco: Never  Vaping Use   Vaping status: Never Used  Substance Use Topics   Alcohol use: No   Drug use: No   Allergies Fenofibrate, Statins, and Sulfa antibiotics  Review of Systems Review of Systems  Physical Exam Vital Signs  I have reviewed the triage vital signs BP 136/67   Pulse 98   Temp 97.8 F (36.6 C) (Axillary)   Resp (!) 28   Ht 5\' 5"  (1.651 m)   Wt 68 kg   SpO2 98%   BMI 24.96 kg/m  Physical Exam Vitals and nursing note reviewed.  Constitutional:      General: She is in acute distress.     Appearance: She is well-developed. She is ill-appearing.  HENT:     Head: Normocephalic and atraumatic.     Mouth/Throat:     Mouth: Mucous membranes are moist.  Eyes:     Pupils: Pupils are equal, round, and reactive to light.  Cardiovascular:     Rate and Rhythm: Regular rhythm. Tachycardia present.     Heart sounds: No murmur heard. Pulmonary:     Effort: Respiratory distress present.     Breath  sounds: Rhonchi (diffusely) present.     Comments: Tachypnea, on BiPAP Abdominal:     General: Abdomen is flat.     Palpations: Abdomen is soft.     Tenderness: There is no abdominal tenderness.  Musculoskeletal:        General: No tenderness.  Right lower leg: No edema.     Left lower leg: No edema.  Skin:    General: Skin is warm and dry.  Neurological:     General: No focal deficit present.     Mental Status: She is alert. Mental status is at baseline.  Psychiatric:        Mood and Affect: Mood normal.        Behavior: Behavior normal.     ED Results and Treatments Labs (all labs ordered are listed, but only abnormal results are displayed) Labs Reviewed  COMPREHENSIVE METABOLIC PANEL - Abnormal; Notable for the following components:      Result Value   CO2 15 (*)    Glucose, Bld 161 (*)    BUN 31 (*)    Creatinine, Ser 1.28 (*)    Total Bilirubin 1.7 (*)    GFR, Estimated 41 (*)    All other components within normal limits  CBC WITH DIFFERENTIAL/PLATELET - Abnormal; Notable for the following components:   WBC 14.0 (*)    RBC 3.86 (*)    Hemoglobin 11.4 (*)    HCT 35.1 (*)    Neutro Abs 12.3 (*)    Lymphs Abs 0.5 (*)    Abs Immature Granulocytes 0.10 (*)    All other components within normal limits  PROTIME-INR - Abnormal; Notable for the following components:   Prothrombin Time 15.9 (*)    INR 1.3 (*)    All other components within normal limits  BLOOD GAS, ARTERIAL - Abnormal; Notable for the following components:   pCO2 arterial 27 (*)    pO2, Arterial 54 (*)    Bicarbonate 17.3 (*)    Acid-base deficit 6.1 (*)    All other components within normal limits  BLOOD GAS, VENOUS - Abnormal; Notable for the following components:   pCO2, Ven 27 (*)    pO2, Ven 54 (*)    Bicarbonate 17.3 (*)    Acid-base deficit 6.1 (*)    All other components within normal limits  CULTURE, BLOOD (ROUTINE X 2)  CULTURE, BLOOD (ROUTINE X 2)  MRSA NEXT GEN BY PCR, NASAL   LACTIC ACID, PLASMA  LACTIC ACID, PLASMA  URINALYSIS, W/ REFLEX TO CULTURE (INFECTION SUSPECTED)                                                                                                                          Radiology DG Chest Portable 1 View Result Date: 10/28/2023 CLINICAL DATA:  Shortness of breath.  Respiratory distress. EXAM: PORTABLE CHEST 1 VIEW COMPARISON:  Chest radiograph dated October 15, 2023. CT chest dated October 20, 2023. FINDINGS: Stable cardiomediastinal silhouette. Aortic atherosclerosis. Patchy airspace opacities within the bilateral upper lung zones, appear increased compared to the prior exam dated October 20, 2023, allowing for variations in modality. Small bilateral pleural effusions with left-greater-than-right basilar atelectasis. No pneumothorax. No acute osseous abnormality. IMPRESSION: 1. Increased patchy airspace opacities within the bilateral upper lung zones,  favored to reflect an infectious/inflammatory etiology. 2. Persistent small bilateral pleural effusions with left-greater-than-right basilar atelectasis. Electronically Signed   By: Hart Robinsons M.D.   On: 10/28/2023 14:24    Pertinent labs & imaging results that were available during my care of the patient were reviewed by me and considered in my medical decision making (see MDM for details).  Medications Ordered in ED Medications  azithromycin (ZITHROMAX) 500 mg in sodium chloride 0.9 % 250 mL IVPB (0 mg Intravenous Stopped 10/28/23 1602)  vancomycin (VANCOREADY) IVPB 1500 mg/300 mL (1,500 mg Intravenous New Bag/Given 10/28/23 1602)  vancomycin (VANCOREADY) IVPB 1250 mg/250 mL (has no administration in time range)  albuterol (PROVENTIL) (2.5 MG/3ML) 0.083% nebulizer solution 2.5 mg (2.5 mg Nebulization Given 10/28/23 1535)  methylPREDNISolone sodium succinate (SOLU-MEDROL) 125 mg/2 mL injection 80 mg (80 mg Intravenous Given 10/28/23 1557)  ceFEPIme (MAXIPIME) 2 g in sodium chloride 0.9 % 100 mL IVPB  (0 g Intravenous Stopped 10/28/23 1532)                                                                                                                                     Procedures .Critical Care  Performed by: Lonell Grandchild, MD Authorized by: Lonell Grandchild, MD   Critical care provider statement:    Critical care time (minutes):  30   Critical care was necessary to treat or prevent imminent or life-threatening deterioration of the following conditions:  Respiratory failure and sepsis   Critical care was time spent personally by me on the following activities:  Development of treatment plan with patient or surrogate, discussions with consultants, evaluation of patient's response to treatment, examination of patient, ordering and review of laboratory studies, ordering and review of radiographic studies, ordering and performing treatments and interventions, pulse oximetry, re-evaluation of patient's condition and review of old charts   (including critical care time)  Medical Decision Making / ED Course   MDM:  88 year old female presenting to the emergency department respiratory distress.  Patient ill-appearing, with tachypnea, mild tachycardia.  No hypotension.  Oxygenation improved with BiPAP.  She does report she feels better with this.  She has diffuse rhonchi on exam.  Concern for possible hospital-acquired pneumonia versus progression of her aspiration pneumonia.  Will cover broadly with cefepime, azithromycin and vancomycin.  Her laboratory testing is notable for mild leukocytosis.  VBG without CO2 retention.  Considered other causes of respiratory failure such as CHF, no significant lower extremity swelling.  Also considered PE, however this was evaluated on previous hospitalization, had a low risk nuclear medicine study, patient denies chest pain.  Discussed with the hospitalist will admit the patient.      Additional history obtained: -Additional history obtained  from family and ems -External records from outside source obtained and reviewed including: Chart review including previous notes, labs, imaging, consultation notes including d/c suimmary    Lab Tests: -I ordered, reviewed,  and interpreted labs.   The pertinent results include:   Labs Reviewed  COMPREHENSIVE METABOLIC PANEL - Abnormal; Notable for the following components:      Result Value   CO2 15 (*)    Glucose, Bld 161 (*)    BUN 31 (*)    Creatinine, Ser 1.28 (*)    Total Bilirubin 1.7 (*)    GFR, Estimated 41 (*)    All other components within normal limits  CBC WITH DIFFERENTIAL/PLATELET - Abnormal; Notable for the following components:   WBC 14.0 (*)    RBC 3.86 (*)    Hemoglobin 11.4 (*)    HCT 35.1 (*)    Neutro Abs 12.3 (*)    Lymphs Abs 0.5 (*)    Abs Immature Granulocytes 0.10 (*)    All other components within normal limits  PROTIME-INR - Abnormal; Notable for the following components:   Prothrombin Time 15.9 (*)    INR 1.3 (*)    All other components within normal limits  BLOOD GAS, ARTERIAL - Abnormal; Notable for the following components:   pCO2 arterial 27 (*)    pO2, Arterial 54 (*)    Bicarbonate 17.3 (*)    Acid-base deficit 6.1 (*)    All other components within normal limits  BLOOD GAS, VENOUS - Abnormal; Notable for the following components:   pCO2, Ven 27 (*)    pO2, Ven 54 (*)    Bicarbonate 17.3 (*)    Acid-base deficit 6.1 (*)    All other components within normal limits  CULTURE, BLOOD (ROUTINE X 2)  CULTURE, BLOOD (ROUTINE X 2)  MRSA NEXT GEN BY PCR, NASAL  LACTIC ACID, PLASMA  LACTIC ACID, PLASMA  URINALYSIS, W/ REFLEX TO CULTURE (INFECTION SUSPECTED)    Notable for no hypercapnea, leukocytosis   Imaging Studies ordered: I ordered imaging studies including CXR On my interpretation imaging demonstrates worsening infiltrates  I independently visualized and interpreted imaging. I agree with the radiologist interpretation   Medicines  ordered and prescription drug management: Meds ordered this encounter  Medications   DISCONTD: cefTRIAXone (ROCEPHIN) 2 g in sodium chloride 0.9 % 100 mL IVPB    Antibiotic Indication::   CAP   DISCONTD: azithromycin (ZITHROMAX) 500 mg in sodium chloride 0.9 % 250 mL IVPB    Antibiotic Indication::   CAP   ceFEPIme (MAXIPIME) 2 g in sodium chloride 0.9 % 100 mL IVPB   azithromycin (ZITHROMAX) 500 mg in sodium chloride 0.9 % 250 mL IVPB    Antibiotic Indication::   CAP   vancomycin (VANCOREADY) IVPB 1500 mg/300 mL    Indication::   Pneumonia   vancomycin (VANCOREADY) IVPB 1250 mg/250 mL    Indication::   Pneumonia   DISCONTD: methylPREDNISolone sodium succinate (SOLU-MEDROL) 125 mg/2 mL injection 80 mg    IV methylprednisolone will be converted to either a q12h or q24h frequency with the same total daily dose (TDD).  Ordered Dose: 1 to 125 mg TDD; convert to: TDD q24h.  Ordered Dose: 126 to 250 mg TDD; convert to: TDD div q12h.  Ordered Dose: >250 mg TDD; DAW.   albuterol (PROVENTIL) (2.5 MG/3ML) 0.083% nebulizer solution 2.5 mg   methylPREDNISolone sodium succinate (SOLU-MEDROL) 125 mg/2 mL injection 80 mg    IV methylprednisolone will be converted to either a q12h or q24h frequency with the same total daily dose (TDD).  Ordered Dose: 1 to 125 mg TDD; convert to: TDD q24h.  Ordered Dose: 126 to 250 mg TDD; convert  to: TDD div q12h.  Ordered Dose: >250 mg TDD; DAW.    -I have reviewed the patients home medicines and have made adjustments as needed   Consultations Obtained: I requested consultation with the hospitalist,  and discussed lab and imaging findings as well as pertinent plan - they recommend: admission   Cardiac Monitoring: The patient was maintained on a cardiac monitor.  I personally viewed and interpreted the cardiac monitored which showed an underlying rhythm of: stach   Reevaluation: After the interventions noted above, I reevaluated the patient and found that their  symptoms have improved  Co morbidities that complicate the patient evaluation  Past Medical History:  Diagnosis Date   Hypertension    Thyroid disease       Dispostion: Disposition decision including need for hospitalization was considered, and patient admitted to the hospital.    Final Clinical Impression(s) / ED Diagnoses Final diagnoses:  Acute respiratory failure with hypoxia (HCC)     This chart was dictated using voice recognition software.  Despite best efforts to proofread,  errors can occur which can change the documentation meaning.    Lonell Grandchild, MD 10/28/23 [phone]

## 2023-10-28 NOTE — ED Notes (Signed)
ABG changed to ABG

## 2023-10-28 NOTE — H&P (Signed)
History and Physical  Sherry Waller YNW:[id number] DOB: Aug 17, 1936 DOA: 10/28/2023  PCP: Assunta Found, MD Patient coming from: [facility]  I have personally briefly reviewed patient's old medical records in [facility]   Chief Complaint: Shortness of breath and confusion  HPI: Sherry Waller is a 88 y.o. female past medical history of hypothyroidism, depression malignant hypertension diabetes mellitus type 2 insulin-dependent with a last A1c of 6.6, chronic kidney disease stage IIIb recently discharged from the hospital on January 2025 for acute kidney injury on chronic kidney disease stage IIIb and malignant hypertension was brought into the hospital with family with shortness of breath and confusion the patient is on BiPAP and relates she feels bad, is slightly confused but as per the son she has been coughing since she was discharged from the hospital and is has not gotten better decreased appetite short of breath for the past 5 days prior to admission and started getting confused the day prior to admission when EMS got there she was satting about 74% on room air and placed on a nonrebreather saturations increased to 92% on 15 L now on BiPAP in the ED.  In the ED: She was found to be afebrile with a white count of 14 and a left shift, chest x-ray did show increased patchy opacities with persistent bilateral small pleural effusions on arrival to the ED she was found to be hypoxic placed on BiPAP and her saturations are now 98% or greater, and her respiratory rate is around 20, ABG was done that shows a pH of 7.4 1/27/154, bicarb and BMI it was 15, lactic acid was 1.9 PT and INR were 15 and 1.3 blood cultures were sent and she was started empirically on IV vancomycin and cefepime along with azithromycin.   Review of Systems: All systems reviewed and apart from history of presenting illness, are negative.  Past Medical History:  Diagnosis Date   Hypertension    Thyroid disease     Past Surgical History:  Procedure Laterality Date   ABDOMINAL HYSTERECTOMY     CHOLECYSTECTOMY     TONSILLECTOMY     Social History:  reports that she has never smoked. She has never used smokeless tobacco. She reports that she does not drink alcohol and does not use drugs.   Allergies  Allergen Reactions   Fenofibrate Other (See Comments)    Aches   Statins Itching   Sulfa Antibiotics Other (See Comments)    headahces    Family History  Problem Relation Age of Onset   Brain cancer Father    Ovarian cancer Sister    Brain cancer Sister     Prior to Admission medications   Medication Sig Start Date End Date Taking? Authorizing Provider  acetaminophen (TYLENOL) 500 MG tablet Take 2 tablets (1,000 mg total) by mouth every 8 (eight) hours as needed for mild pain (pain score 1-3), headache or fever. 10/03/23   Vassie Loll, MD  amLODipine (NORVASC) 10 MG tablet Take 1 tablet (10 mg total) by mouth daily. 10/20/23   Kendell Bane, MD  amoxicillin-clavulanate (AUGMENTIN) 875-125 MG tablet Take 1 tablet by mouth every 12 (twelve) hours. X 5 days 10/23/23   Catarina Hartshorn, MD  azithromycin (ZITHROMAX) 500 MG tablet Take 500 mg by mouth daily. 10/28/23 11/01/23  [provider]  carvedilol (COREG) 3.125 MG tablet Take 1 tablet (3.125 mg total) by mouth 2 (two) times daily. 10/03/23   Vassie Loll, MD  cloNIDine (  CATAPRES) 0.3 MG tablet Take 1 tablet (0.3 mg total) by mouth 2 (two) times daily. 10/23/23   Catarina Hartshorn, MD  cyanocobalamin 1000 MCG tablet Take 1,000 mcg by mouth daily.    [provider]  escitalopram (LEXAPRO) 5 MG tablet Take 1 tablet (5 mg total) by mouth daily. 10/20/23   Shahmehdi, Gemma Payor, MD  ferrous sulfate 325 (65 FE) MG tablet Take 1 tablet (325 mg total) by mouth daily with breakfast. 10/20/23   Shahmehdi, Gemma Payor, MD  furosemide (LASIX) 40 MG tablet Take 40 mg by mouth once. Has been given    [provider]  gabapentin (NEURONTIN) 100 MG  capsule Take 100 mg by mouth daily.    [provider]  hydrALAZINE (APRESOLINE) 100 MG tablet Take 1 tablet (100 mg total) by mouth 3 (three) times daily. 10/23/23   Catarina Hartshorn, MD  ipratropium-albuterol (DUONEB) 0.5-2.5 (3) MG/3ML SOLN Take 3 mLs by nebulization every 6 (six) hours. 10/28/23 11/01/23  [provider]  isosorbide mononitrate (IMDUR) 60 MG 24 hr tablet Take 1 tablet (60 mg total) by mouth daily. 10/04/23   Vassie Loll, MD  levothyroxine (SYNTHROID, LEVOTHROID) 50 MCG tablet Take 50 mcg by mouth daily before breakfast.    [provider]  Mouthwashes (MOUTH RINSE) LIQD solution 15 mLs by Mouth Rinse route as needed (for oral care). 10/19/23   Shahmehdi, Gemma Payor, MD  Nutritional Supplements (PROSOURCE) LIQD Take 30 mLs by mouth 3 (three) times daily.    [provider]  pantoprazole (PROTONIX) 40 MG tablet Take 1 tablet (40 mg total) by mouth daily. 10/04/23   Vassie Loll, MD  predniSONE (DELTASONE) 20 MG tablet Take 20 mg by mouth daily with breakfast. 60 mg stat (has been given) then 40 mg daily for 2 days; then 20 mg for 2 days. Then stop    [provider]   Physical Exam: Vitals:   10/28/23 1330 10/28/23 1345 10/28/23 1400 10/28/23 1407  BP: (!) 153/86 (!) 161/70 (!) 150/66   Pulse: (!) 106 (!) 112 (!) 101   Resp: (!) 28 (!) 34 (!) 29   Temp:      TempSrc:      SpO2: 98% 96% 98%   Weight:    68 kg  Height:    5\' 5"  (1.651 m)    General exam: Moderately built and nourished patient, laying in bed appears tired Head, eyes and ENT: Nontraumatic and normocephalic.  Dry mucous membrane Neck: Supple. No JVD, carotid bruit or thyromegaly. Lymphatics: No lymphadenopathy. Respiratory system: Work breathing breathing about 18-22 times per minute good air movement and wheezing bilaterally Cardiovascular system: Slightly tachycardic with a regular rate positive S1-S2 Gastrointestinal system: Bowel sounds soft nontender  nondistended Extremities: Symmetric 5 x 5 power. Peripheral pulses symmetrically felt.  Skin: No rashes or acute findings. Musculoskeletal system: Negative exam. Psychiatry: Pleasant and cooperative.   Labs on Admission:  Basic Metabolic Panel: Recent Labs  Lab 10/22/23 0415 10/23/23 0402 10/28/23 0800 10/28/23 1251  NA 130* 132* 135 136  K 4.4 4.5 4.0 4.8  CL 98 101 106 107  CO2 25 23 16* 15*  GLUCOSE 128* 117* 143* 161*  BUN 40* 35* 28* 31*  CREATININE 1.82* 1.44* 1.24* 1.28*  CALCIUM 9.1 9.2 9.5 9.5  PHOS 4.4 3.7  --   --    Liver Function Tests: Recent Labs  Lab 10/22/23 0415 10/23/23 0402 10/28/23 0956 10/28/23 1251  AST  --   --  17 26  ALT  --   --  10 13  ALKPHOS  --   --  81 81  BILITOT  --   --  1.2 1.7*  PROT  --   --  7.2 7.3  ALBUMIN 2.7* 2.7* 3.5 3.5   No results for input(s): "LIPASE", "AMYLASE" in the last 168 hours. No results for input(s): "AMMONIA" in the last 168 hours. CBC: Recent Labs  Lab 10/22/23 0415 10/28/23 0800 10/28/23 0956 10/28/23 1251  WBC 6.0  --  13.4* 14.0*  NEUTROABS  --   --  11.4* 12.3*  HGB 8.2* 11.1* 11.2* 11.4*  HCT 26.5* 35.6* 34.8* 35.1*  MCV 91.7  --  90.9 90.9  PLT 294  --  346 344   Cardiac Enzymes: No results for input(s): "CKTOTAL", "CKMB", "CKMBINDEX", "TROPONINI" in the last 168 hours.  BNP (last 3 results) No results for input(s): "PROBNP" in the last 8760 hours. CBG: Recent Labs  Lab 10/22/23 1146 10/22/23 1626 10/22/23 2116 10/23/23 0825 10/23/23 1114  GLUCAP 122* 151* 100* 106* 114*    Radiological Exams on Admission: DG Chest Portable 1 View Result Date: 10/28/2023 CLINICAL DATA:  Shortness of breath.  Respiratory distress. EXAM: PORTABLE CHEST 1 VIEW COMPARISON:  Chest radiograph dated October 15, 2023. CT chest dated October 20, 2023. FINDINGS: Stable cardiomediastinal silhouette. Aortic atherosclerosis. Patchy airspace opacities within the bilateral upper lung zones, appear increased  compared to the prior exam dated October 20, 2023, allowing for variations in modality. Small bilateral pleural effusions with left-greater-than-right basilar atelectasis. No pneumothorax. No acute osseous abnormality. IMPRESSION: 1. Increased patchy airspace opacities within the bilateral upper lung zones, favored to reflect an infectious/inflammatory etiology. 2. Persistent small bilateral pleural effusions with left-greater-than-right basilar atelectasis. Electronically Signed   By: Hart Robinsons M.D.   On: 10/28/2023 14:24    EKG: Independently reviewed.  None  Assessment/Plan Acute respiratory failure with hypoxia likely due to hospital-acquired pneumonia (possibly aspiration pneumonia): ABG showed a pH of 7.02/05/1951 She was placed on BiPAP in the ED her saturations improved to greater than 98% her respiratory rate slowed down. She was started empirically on IV vancomycin cefepime and azithromycin. Blood cultures have been ordered. Her chest x-ray did show bilateral infiltrates with a persisting cough and leukocytosis, seems like a hospital-acquired pneumonia, but I am more concerned about aspiration pneumonia as she was on good coverage at the facility on Unasyn and azithromycin She is also wheezing on physical exam we will start her on IV steroids, ulcers respiration improving we will start weaning down her steroids quickly. Will admit her to the stepdown unit. She is currently a DNR DNI as spoken extensively to the son and daughter-in-law about her prognosis and they agreed they are very appreciative of the treatment we are giving to her.  And they are aware that her mortality is significantly high. Calculated curb 65% mortality of 15-30%. Will check SARS-CoV-2 influenza PCR and RSV.  Severe sepsis likely due to hospital-acquired pneumonia: Will start empirically on IV vancomycin, cefepime and azithromycin. Blood cultures were sent. She will be admitted to the ICU. She is a  DNR/DNI. She does not want pressors the family has confirmed this. Does not want intubation.  Acute metabolic encephalopathy: Multifactorial likely due to hypoxia and possibly infectious etiology.  Normal anion gap metabolic acidosis: Probably due to high chloride plus or minus renal dysfunction we will put her on half-normal saline and recheck a basic metabolic panel in the morning.  Type  2 diabetes mellitus with stage 3 chronic kidney disease and hypertension (HCC) Will place her n.p.o. as she will be on steroids which will make blood glucose erratic. Her last A1c was 6.6 he is currently on no oral hypoglycemic agents or insulin's at the facility. Will place her on sliding scale insulin and will place her n.p.o. for now.  CBGs every 4.  CKD stage 3 due to type 2 diabetes mellitus (HCC) Creatinine at baseline continue to monitor closely.  Diabetes mellitus with diabetic nephropathy, with long-term current use of insulin (HCC) Continue Neurontin.    DVT Prophylaxis: lovenox Code Status: DNR/DNI  Family Communication: son and daughter in law  Disposition Plan: inpatient SDU   Time spent: 75 min    It is my clinical opinion that admission to INPATIENT is reasonable and necessary in this 89 y.o. female with diabetes mellitus who comes in for acute respiratory failure with hypoxia due to probably aspiration pneumonia requires BiPAP satting greater then 90% on BiPAP respiration 20 requiring stepdown admission  Given the aforementioned, the predictability of an adverse outcome is felt to be significant. I expect that the patient will require at least 2 midnights in the hospital to treat this condition.  Marinda Elk MD [facility]   10/28/2023, 3:09 PM

## 2023-10-28 NOTE — Progress Notes (Addendum)
 Marland Kitchen

## 2023-10-28 NOTE — Progress Notes (Signed)
Pharmacy Antibiotic Note  Sherry Waller is a 88 y.o. female admitted on 10/28/2023 with pneumonia.  Pharmacy has been consulted for vancomycin dosing.  Plan: Vancomycin 1500 mg IV x 1 dose. Vancomycin 1250 mg IV every 48 hours. Cefepime 2000 mg IV x 1 dose. F/U additional doses Monitor labs, c/s, and vanco levels as indicated.  Height: 5\' 5"  (165.1 cm) Weight: 68 kg (150 lb) IBW/kg (Calculated) : 57  Temp (24hrs), Avg:97.9 F (36.6 C), Min:97.8 F (36.6 C), Max:97.9 F (36.6 C)  Recent Labs  Lab 10/22/23 0415 10/23/23 0402 10/28/23 0800 10/28/23 0954 10/28/23 0956 10/28/23 1251 10/28/23 1257  WBC 6.0  --   --   --  13.4* 14.0*  --   CREATININE 1.82* 1.44* 1.24*  --   --  1.28*  --   LATICACIDVEN  --   --   --  1.8  --   --  1.9    Estimated Creatinine Clearance: 28.9 mL/min (A) (by C-G formula based on SCr of 1.28 mg/dL (H)).    Allergies  Allergen Reactions   Fenofibrate Other (See Comments)    Aches   Statins Itching   Sulfa Antibiotics Other (See Comments)    headahces    Antimicrobials this admission: Vanco 1/16 >> Cefepime 1/16 Azith 1/16 >>    Microbiology results: 1/16 BCx: pending  1/16 MRSA PCR: pending  Thank you for allowing pharmacy to be a part of this patient's care.  Judeth Cornfield, PharmD Clinical Pharmacist 10/28/2023 2:40 PM

## 2023-10-28 NOTE — Plan of Care (Signed)

## 2023-10-28 NOTE — ED Notes (Signed)
Pt placed on Bipap

## 2023-10-29 ENCOUNTER — Other Ambulatory Visit: Payer: Self-pay

## 2023-10-29 DIAGNOSIS — J189 Pneumonia, unspecified organism: Secondary | ICD-10-CM | POA: Diagnosis not present

## 2023-10-29 DIAGNOSIS — R6520 Severe sepsis without septic shock: Secondary | ICD-10-CM

## 2023-10-29 DIAGNOSIS — E1121 Type 2 diabetes mellitus with diabetic nephropathy: Secondary | ICD-10-CM

## 2023-10-29 DIAGNOSIS — J9601 Acute respiratory failure with hypoxia: Secondary | ICD-10-CM

## 2023-10-29 DIAGNOSIS — E1122 Type 2 diabetes mellitus with diabetic chronic kidney disease: Secondary | ICD-10-CM

## 2023-10-29 DIAGNOSIS — Y95 Nosocomial condition: Secondary | ICD-10-CM

## 2023-10-29 DIAGNOSIS — Z794 Long term (current) use of insulin: Secondary | ICD-10-CM

## 2023-10-29 DIAGNOSIS — A419 Sepsis, unspecified organism: Secondary | ICD-10-CM

## 2023-10-29 DIAGNOSIS — I129 Hypertensive chronic kidney disease with stage 1 through stage 4 chronic kidney disease, or unspecified chronic kidney disease: Secondary | ICD-10-CM

## 2023-10-29 DIAGNOSIS — N183 Chronic kidney disease, stage 3 unspecified: Secondary | ICD-10-CM

## 2023-10-29 DIAGNOSIS — G9341 Metabolic encephalopathy: Secondary | ICD-10-CM

## 2023-10-29 LAB — COMPREHENSIVE METABOLIC PANEL
ALT: 13 U/L (ref 0–44)
AST: 21 U/L (ref 15–41)
Albumin: 3.2 g/dL — ABNORMAL LOW (ref 3.5–5.0)
Alkaline Phosphatase: 65 U/L (ref 38–126)
Anion gap: 15 (ref 5–15)
BUN: 38 mg/dL — ABNORMAL HIGH (ref 8–23)
CO2: 15 mmol/L — ABNORMAL LOW (ref 22–32)
Calcium: 9.6 mg/dL (ref 8.9–10.3)
Chloride: 110 mmol/L (ref 98–111)
Creatinine, Ser: 1.43 mg/dL — ABNORMAL HIGH (ref 0.44–1.00)
GFR, Estimated: 36 mL/min — ABNORMAL LOW (ref >60)
Glucose, Bld: 200 mg/dL — ABNORMAL HIGH (ref 70–99)
Potassium: 3.6 mmol/L (ref 3.5–5.1)
Sodium: 140 mmol/L (ref 135–145)
Total Bilirubin: 1.3 mg/dL — ABNORMAL HIGH (ref 0.0–1.2)
Total Protein: 7 g/dL (ref 6.5–8.1)

## 2023-10-29 LAB — CBC
HCT: 31.6 % — ABNORMAL LOW (ref 36.0–46.0)
Hemoglobin: 10 g/dL — ABNORMAL LOW (ref 12.0–15.0)
MCH: 29.2 pg (ref 26.0–34.0)
MCHC: 31.6 g/dL (ref 30.0–36.0)
MCV: 92.1 fL (ref 80.0–100.0)
Platelets: 317 10*3/uL (ref 150–400)
RBC: 3.43 MIL/uL — ABNORMAL LOW (ref 3.87–5.11)
RDW: 14.8 % (ref 11.5–15.5)
WBC: 15.4 10*3/uL — ABNORMAL HIGH (ref 4.0–10.5)
nRBC: 0 % (ref 0.0–0.2)

## 2023-10-29 LAB — BRAIN NATRIURETIC PEPTIDE: B Natriuretic Peptide: 4152 pg/mL — ABNORMAL HIGH (ref 0.0–100.0)

## 2023-10-29 MED ORDER — POLYVINYL ALCOHOL 1.4 % OP SOLN
1.0000 [drp] | Freq: Four times a day (QID) | OPHTHALMIC | Status: DC | PRN
  Administered 2023-10-31: 1 [drp] via OPHTHALMIC
  Filled 2023-10-29: qty 15

## 2023-10-29 MED ORDER — BUDESONIDE 0.5 MG/2ML IN SUSP
0.5000 mg | Freq: Two times a day (BID) | RESPIRATORY_TRACT | Status: DC
  Administered 2023-10-29: 0.5 mg via RESPIRATORY_TRACT
  Filled 2023-10-29 (×2): qty 2

## 2023-10-29 MED ORDER — LORAZEPAM 2 MG/ML PO CONC: 1.0000 mg | Freq: Four times a day (QID) | ORAL | Status: DC | PRN

## 2023-10-29 MED ORDER — GLYCOPYRROLATE 0.2 MG/ML IJ SOLN: 0.2000 mg | INTRAMUSCULAR | Status: DC | PRN

## 2023-10-29 MED ORDER — LORAZEPAM 2 MG/ML IJ SOLN
0.5000 mg | Freq: Once | INTRAMUSCULAR | Status: AC
  Administered 2023-10-29: 0.5 mg via INTRAVENOUS
  Filled 2023-10-29: qty 1

## 2023-10-29 MED ORDER — BIOTENE DRY MOUTH MT LIQD
15.0000 mL | OROMUCOSAL | Status: DC | PRN
Start: 2023-10-29 — End: 2023-10-31

## 2023-10-29 MED ORDER — ALBUTEROL SULFATE (2.5 MG/3ML) 0.083% IN NEBU: 2.5000 mg | INHALATION_SOLUTION | RESPIRATORY_TRACT | Status: DC | PRN

## 2023-10-29 MED ORDER — LORAZEPAM 2 MG/ML IJ SOLN
1.0000 mg | Freq: Four times a day (QID) | INTRAMUSCULAR | Status: DC | PRN
  Administered 2023-10-31 (×2): 1 mg via INTRAVENOUS
  Filled 2023-10-29 (×2): qty 1

## 2023-10-29 MED ORDER — HYDROMORPHONE HCL 1 MG/ML IJ SOLN
0.5000 mg | INTRAMUSCULAR | Status: DC | PRN
  Administered 2023-10-29: 2 mg via INTRAVENOUS
  Administered 2023-10-29 – 2023-10-31 (×9): 1 mg via INTRAVENOUS
  Filled 2023-10-29 (×2): qty 1
  Filled 2023-10-29: qty 2
  Filled 2023-10-29 (×7): qty 1

## 2023-10-29 MED ORDER — SODIUM CHLORIDE 0.9% FLUSH
3.0000 mL | Freq: Two times a day (BID) | INTRAVENOUS | Status: DC
  Administered 2023-10-29 – 2023-10-31 (×5): 3 mL via INTRAVENOUS

## 2023-10-29 MED ORDER — GLYCOPYRROLATE 1 MG PO TABS: 1.0000 mg | ORAL_TABLET | ORAL | Status: DC | PRN

## 2023-10-29 MED ORDER — HALOPERIDOL LACTATE 2 MG/ML PO CONC: 0.5000 mg | ORAL | Status: DC | PRN

## 2023-10-29 MED ORDER — HALOPERIDOL 0.5 MG PO TABS: 0.5000 mg | ORAL_TABLET | ORAL | Status: DC | PRN

## 2023-10-29 MED ORDER — LORAZEPAM 1 MG PO TABS: 1.0000 mg | ORAL_TABLET | Freq: Four times a day (QID) | ORAL | Status: DC | PRN

## 2023-10-29 MED ORDER — LORAZEPAM 2 MG/ML IJ SOLN
INTRAMUSCULAR | Status: AC
  Administered 2023-10-29: 1 mg via INTRAVENOUS
  Filled 2023-10-29: qty 1

## 2023-10-29 MED ORDER — SODIUM CHLORIDE 0.9 % IV SOLN: 250.0000 mL | INTRAVENOUS | Status: AC | PRN

## 2023-10-29 MED ORDER — GLYCOPYRROLATE 0.2 MG/ML IJ SOLN
0.2000 mg | INTRAMUSCULAR | Status: DC | PRN
  Administered 2023-10-29: 0.2 mg via INTRAVENOUS
  Filled 2023-10-29: qty 1

## 2023-10-29 MED ORDER — SODIUM CHLORIDE 0.9% FLUSH: 3.0000 mL | INTRAVENOUS | Status: DC | PRN

## 2023-10-29 MED ORDER — LORAZEPAM 2 MG/ML IJ SOLN
1.0000 mg | Freq: Once | INTRAMUSCULAR | Status: AC
  Administered 2023-10-29: 1 mg via INTRAVENOUS
  Filled 2023-10-29: qty 1

## 2023-10-29 MED ORDER — HALOPERIDOL LACTATE 5 MG/ML IJ SOLN: 0.5000 mg | INTRAMUSCULAR | Status: DC | PRN

## 2023-10-29 NOTE — TOC Initial Note (Signed)
Transition of Care [facility]) - Initial/Assessment Note    Patient Details  Name: Sherry Waller MRN: [id number] Date of Birth: 1935/12/20  [facility] CM/SW Contact:    Elliot Gault, LCSW Phone Number: 10/29/2023, 10:17 AM  Clinical Narrative:                  Pt discharged to [facility] from [place] on 10/23/23. Pt readmitted with respiratory distress. Pt is high risk for readmission.  Reviewed pt's record. Goals of Care will be addressed by MD. If pt improves and able to return to SNF, will need new PT eval and insurance authorization.  TOC will follow.  Expected Discharge Plan: Skilled Nursing Facility Barriers to Discharge: Continued Medical Work up   Patient Goals and CMS Choice            Expected Discharge Plan and Services In-house Referral: Clinical Social Work     Living arrangements for the past 2 months: Single Family Home                                      Prior Living Arrangements/Services Living arrangements for the past 2 months: Single Family Home Lives with:: Self Patient language and need for interpreter reviewed:: Yes        Need for Family Participation in Patient Care: Yes (Comment) Care giver support system in place?: Yes (comment)   Criminal Activity/Legal Involvement Pertinent to Current Situation/Hospitalization: No - Comment as needed  Activities of Daily Living   ADL Screening (condition at time of admission) Independently performs ADLs?: No Does the patient have a NEW difficulty with bathing/dressing/toileting/self-feeding that is expected to last >3 days?: No Does the patient have a NEW difficulty with getting in/out of bed, walking, or climbing stairs that is expected to last >3 days?: Yes (Initiates electronic notice to provider for possible PT consult) Does the patient have a NEW difficulty with communication that is expected to last >3 days?: No Is the patient deaf or have difficulty hearing?: No Does  the patient have difficulty seeing, even when wearing glasses/contacts?: No Does the patient have difficulty concentrating, remembering, or making decisions?: No  Permission Sought/Granted                  Emotional Assessment Appearance:: Appears stated age     Orientation: : Oriented to Self, Oriented to Place, Oriented to  Time, Oriented to Situation Alcohol / Substance Use: Not Applicable Psych Involvement: No (comment)  Admission diagnosis:  Hospital-acquired pneumonia [J18.9, Y95] Acute respiratory failure with hypoxia (HCC) [J96.01] Patient Active Problem List   Diagnosis Date Noted   Acute metabolic encephalopathy 10/28/2023   Normal anion gap metabolic acidosis 10/28/2023   Hospital-acquired pneumonia 10/28/2023   Diabetes mellitus with diabetic nephropathy, with long-term current use of insulin (HCC) 10/26/2023   Unspecified protein-calorie malnutrition (HCC) 10/26/2023   Type 2 diabetes mellitus with stage 3 chronic kidney disease and hypertension (HCC) 10/26/2023   CKD stage 3 due to type 2 diabetes mellitus (HCC) 10/26/2023   Diabetic peripheral neuropathy (HCC) 10/26/2023   GERD without esophagitis 10/26/2023   Major depression, recurrent, chronic (HCC) 10/26/2023   Vitamin B 12 deficiency 10/26/2023   Acute anemia 10/26/2023   Hyponatremia 10/26/2023   Aortic atherosclerosis (HCC) 10/26/2023   Aspiration pneumonitis (HCC) 10/21/2023   AKI (acute kidney injury) (HCC) 10/12/2023   Acute respiratory failure with hypoxia (  HCC) 10/11/2023   HTN (hypertension), malignant 10/11/2023   Hypothyroidism 10/11/2023   CKD (chronic kidney disease) stage 3B- 10/11/2023   Hypertensive urgency 10/01/2023   PCP:  Assunta Found, MD Pharmacy:   [facility] [address], [place] - [address] [place] [address] [place] [address] [place] [address] Phone: [phone] Fax: [phone]  [facility] [place], [place] - [address] [place] [address] Phone: [phone] Fax: [phone]     Social Drivers of Health (SDOH) Social History: SDOH Screenings   Food Insecurity: No Food Insecurity (10/28/2023)  Housing: Low Risk  (10/28/2023)  Transportation Needs: No Transportation Needs (10/28/2023)  Utilities: Not At Risk (10/28/2023)  Social Connections: Patient Unable To Answer (10/28/2023)  Recent Concern: Social Connections - Socially Isolated (10/11/2023)  Tobacco Use: Low Risk  (10/28/2023)   SDOH Interventions:     Readmission Risk Interventions    10/29/2023   10:16 AM 10/13/2023   11:03 AM  Readmission Risk Prevention Plan  Transportation Screening Complete Complete  HRI or Home Care Consult  Complete  Social Work Consult for Recovery Care Planning/Counseling  Complete  Palliative Care Screening  Not Applicable  Medication Review Oceanographer) Complete Complete  HRI or Home Care Consult Complete   SW Recovery Care/Counseling Consult Complete   Palliative Care Screening Complete   Skilled Nursing Facility Complete

## 2023-10-29 NOTE — Progress Notes (Signed)
Was called to patient's room, patient was refusing Bipap stating it was "making her miserable", but patient did need it.  Patient BS are coarse and rate goes up when she is off unit.  Patient is spitting up bloody secretions.  Talked with patient about the benefits of staying on Bipap versus using other methods such as Heated HFNC.  Patient decided to let us place her back on Bipap, but before doing so, I swabbed her mouth and applied moisturizer to her lips and applied a foam bandage to the bridge of her nose for comfort while on mask.  Suction was set up in her room for when she is off and needs to clear secretions.  Will continue to monitor patient and provide comfort measures when needed.

## 2023-10-29 NOTE — Plan of Care (Signed)

## 2023-10-29 NOTE — Progress Notes (Signed)
Progress Note   Patient: Sherry Waller XBJ:[id number] DOB: 06-20-1936 DOA: 10/28/2023     1 DOS: the patient was seen and examined on 10/29/2023   Brief hospital admission course: As per H&P written by Dr. Radonna Ricker on 10/28/2023 Sherry Waller is a 88 y.o. female past medical history of hypothyroidism, depression malignant hypertension diabetes mellitus type 2 insulin-dependent with a last A1c of 6.6, chronic kidney disease stage IIIb recently discharged from the hospital on January 2025 for acute kidney injury on chronic kidney disease stage IIIb and malignant hypertension was brought into the hospital with family with shortness of breath and confusion the patient is on BiPAP and relates she feels bad, is slightly confused but as per the son she has been coughing since she was discharged from the hospital and is has not gotten better decreased appetite short of breath for the past 5 days prior to admission and started getting confused the day prior to admission when EMS got there she was satting about 74% on room air and placed on a nonrebreather saturations increased to 92% on 15 L now on BiPAP in the ED.   In the ED: She was found to be afebrile with a white count of 14 and a left shift, chest x-ray did show increased patchy opacities with persistent bilateral small pleural effusions on arrival to the ED she was found to be hypoxic placed on BiPAP and her saturations are now 98% or greater, and her respiratory rate is around 20, ABG was done that shows a pH of 7.4 1/27/154, bicarb and BMI it was 15, lactic acid was 1.9 PT and INR were 15 and 1.3 blood cultures were sent and she was started empirically on IV vancomycin and cefepime along with azithromycin.  Assessment and Plan: 1-acute respiratory failure with hypoxia -Patient requiring the use of BiPAP to maintain saturation and adequate support. -Appears to be associated with aspiration pneumonia/pneumonitis -Patient very uncomfortable  demonstrating difficulty tolerating BiPAP anxious and after almost 20 hours of treatment not demonstrating significant improvement. -Case discussed with patient's sons at bedside who has recommended and are in agreement to transition patient to full comfort care. -BiPAP will be discontinue; oxygen supplementation for comfort will be provided and end-of-life protocol for air hunger, pain and agitation using Dilaudid, lorazepam and, Haldol and Robinul will be provided. -Patient high risk for hospital death.  2-severe sepsis due to hospital-acquired pneumonia/aspiration -Present at time of admission -At this moment transitioning care into full comfort. -No further antibiotics will be provided.  3-acute metabolic encephalopathy -Most likely triggered by the infectious process along with hypoxia -Will focus on symptomatic management and comfort care.  4-chronic kidney disease due to type 2 diabetes -Appears to be stable and at baseline -No future blood work or labs anticipated -Plan is for full comfort care.  5-type 2 diabetes -Recent A1c 6.6 -No further CBGs monitoring or insulin therapy will be provided. -Comfort feeding will be allowed.  Subjective:  Lethargic after receiving Ativan; no complaints of pain at the moment.  No nausea or vomiting.  Physical Exam: Vitals:   10/29/23 1200 10/29/23 1255 10/29/23 1300 10/29/23 1400  BP:      Pulse: (!) 118 (!) 129 91 (!) 109  Resp: (!) 27 14 13  (!) 32  Temp:      TempSrc:      SpO2: (!) 89% (!) 41% (!) 38% (!) 41%  Weight:      Height:       General exam:  Chronically ill in appearance; high flow nasal cannula in place. Respiratory system: Positive rhonchi appreciated bilaterally; no wheezing.  Positive tachypnea. Cardiovascular system: Sinus tachycardia, no rubs, no gallops, no JVD on exam. Gastrointestinal system: Abdomen is nondistended, soft and nontender.  Positive bowel sounds. Central nervous system: Unable to assess with  current mentation. Extremities: No cyanosis or clubbing. Skin: No petechiae. Psychiatry: Unable to assess with current mentation.  Data Reviewed: Comprehensive metabolic panel: Sodium 140, potassium 3.6, chloride 110, bicarb 15, BUN 38, creatinine 1.4, normal LFTs; GFR 36. CBC: WBCs 15.4, hemoglobin 10.0 and platelet count 317K    Family Communication: Sounds at bedside.  Disposition: Status is: Inpatient Remains inpatient appropriate because: IV therapy   Planned Discharge Destination:  High risk for hospital death; residential hospice   Time spent: 50 minutes  Author: Vassie Loll, MD 10/29/2023 7:09 PM  For on call review [email or web address].

## 2023-10-29 NOTE — Progress Notes (Signed)
Patient is comfort care.  Bipap not needed.

## 2023-10-29 NOTE — Progress Notes (Signed)
Pt's son discussed with me that he does not want a PICC placed on the pt. PICC team notified. MD notified.

## 2023-10-30 DIAGNOSIS — E1121 Type 2 diabetes mellitus with diabetic nephropathy: Secondary | ICD-10-CM | POA: Diagnosis not present

## 2023-10-30 DIAGNOSIS — J9601 Acute respiratory failure with hypoxia: Secondary | ICD-10-CM | POA: Diagnosis not present

## 2023-10-30 DIAGNOSIS — J189 Pneumonia, unspecified organism: Secondary | ICD-10-CM | POA: Diagnosis not present

## 2023-10-30 DIAGNOSIS — E1122 Type 2 diabetes mellitus with diabetic chronic kidney disease: Secondary | ICD-10-CM | POA: Diagnosis not present

## 2023-10-30 NOTE — Progress Notes (Signed)
Progress Note   Patient: Sherry Waller ZOX:[id number] DOB: 12-16-1935 DOA: 10/28/2023     2 DOS: the patient was seen and examined on 10/30/2023   Brief hospital admission course: As per H&P written by Dr. Radonna Ricker on 10/28/2023 Sherry Waller is a 88 y.o. female past medical history of hypothyroidism, depression malignant hypertension diabetes mellitus type 2 insulin-dependent with a last A1c of 6.6, chronic kidney disease stage IIIb recently discharged from the hospital on January 2025 for acute kidney injury on chronic kidney disease stage IIIb and malignant hypertension was brought into the hospital with family with shortness of breath and confusion the patient is on BiPAP and relates she feels bad, is slightly confused but as per the son she has been coughing since she was discharged from the hospital and is has not gotten better decreased appetite short of breath for the past 5 days prior to admission and started getting confused the day prior to admission when EMS got there she was satting about 74% on room air and placed on a nonrebreather saturations increased to 92% on 15 L now on BiPAP in the ED.   In the ED: She was found to be afebrile with a white count of 14 and a left shift, chest x-ray did show increased patchy opacities with persistent bilateral small pleural effusions on arrival to the ED she was found to be hypoxic placed on BiPAP and her saturations are now 98% or greater, and her respiratory rate is around 20, ABG was done that shows a pH of 7.4 1/27/154, bicarb and BMI it was 15, lactic acid was 1.9 PT and INR were 15 and 1.3 blood cultures were sent and she was started empirically on IV vancomycin and cefepime along with azithromycin.  Assessment and Plan: 1-acute respiratory failure with hypoxia -Patient requiring the use of BiPAP to maintain saturation and adequate support. -Appears to be associated with aspiration pneumonia/pneumonitis -Patient very uncomfortable  demonstrating difficulty tolerating BiPAP anxious and after almost 20 hours of treatment not demonstrating significant improvement. -Case discussed with patient's sons at bedside who has recommended and are in agreement to transition patient to full comfort care. -BiPAP successfully discontinued; oxygen supplementation for comfort will be provided and end-of-life protocol for air hunger, pain and agitation using Dilaudid, lorazepam and, Haldol and Robinul will be provided. -Patient is actively dying and will pass away in the hospital  2-severe sepsis due to hospital-acquired pneumonia/aspiration -Present at time of admission -At this moment transitioning care into full comfort. -No further antibiotics will be provided.  3-acute metabolic encephalopathy -Most likely triggered by the infectious process along with hypoxia -Will focus on symptomatic management and comfort care.  4-chronic kidney disease due to type 2 diabetes -Appears to be stable and at baseline -No future blood work or labs anticipated -Plan is for full comfort care.  5-type 2 diabetes -Recent A1c 6.6 -No further CBGs monitoring or insulin therapy will be provided. -Comfort feeding will be allowed.  Subjective:  In no major distress; appears comfortable and actively dying.  Physical Exam: Vitals:   10/29/23 2000 10/29/23 2300 10/30/23 0800 10/30/23 1456  BP:  (!) 131/43  (!) 62/38  Pulse: (!) 103 (!) 101 79 65  Resp: 18 17 16  (!) 6  Temp:      TempSrc:      SpO2: (!) 46% (!) 39% (!) 47% (!) 58%  Weight:      Height:       General exam: Nonverbal; appears to  be comfortable.  Actively dying. Respiratory system: Positive scattered rhonchi; no using accessory muscles.  Agonal breathing. Cardiovascular system: No rubs, no gallops. Gastrointestinal system: Abdomen is nondistended, soft and nontender.  Positive bowel sounds. Central nervous system: Currently nonverbal. Extremities: No cyanosis or clubbing. Skin:  No petechiae. Psychiatry: Unable to assess.  Latest data Reviewed: Comprehensive metabolic panel: Sodium 140, potassium 3.6, chloride 110, bicarb 15, BUN 38, creatinine 1.4, normal LFTs; GFR 36. CBC: WBCs 15.4, hemoglobin 10.0 and platelet count 317K    Family Communication: Sounds at bedside.  Disposition: Status is: Inpatient Remains inpatient appropriate because: IV therapy   Planned Discharge Destination:  High risk for hospital death; residential hospice  Time spent: 50 minutes  Author: Vassie Loll, MD 10/30/2023 3:08 PM  For on call review [email or web address].

## 2023-10-30 NOTE — Progress Notes (Signed)
Pt arrived to unit not responsive to pain or voice. Transitioning. Respirations 6. Appears calm and resting comfortably. Provided family with emotional support.

## 2023-10-31 DIAGNOSIS — E872 Acidosis, unspecified: Secondary | ICD-10-CM

## 2023-10-31 DIAGNOSIS — J690 Pneumonitis due to inhalation of food and vomit: Secondary | ICD-10-CM

## 2023-10-31 DIAGNOSIS — J9601 Acute respiratory failure with hypoxia: Secondary | ICD-10-CM | POA: Diagnosis not present

## 2023-10-31 DIAGNOSIS — E1121 Type 2 diabetes mellitus with diabetic nephropathy: Secondary | ICD-10-CM | POA: Diagnosis not present

## 2023-11-02 LAB — CULTURE, BLOOD (ROUTINE X 2)
Culture: NO GROWTH
Culture: NO GROWTH
Culture: NO GROWTH
Culture: NO GROWTH
Special Requests: ADEQUATE
Special Requests: ADEQUATE
Special Requests: ADEQUATE

## 2023-11-13 NOTE — Progress Notes (Signed)
Received call to nurse's station from pt room, son states he thinks patient has passed away. Staff RN's to room, pt without spontaneous respirations, no apical pulse detected x 1 minute. This was verified by second RN. Confirmed pt's death with son. TOD called at 1554. Sympathy offered to son, he states no needs at this time. He has notified his brother of pt's passing. Advised to call for needs, son states understanding.

## 2023-11-13 NOTE — Death Summary Note (Signed)
DEATH SUMMARY   Patient Details  Name: Sherry Waller MRN: [id number] DOB: [birth year] VFI:[id number], Sherry Ruiz, MD Admission/Discharge Information   Admit Date:  11-18-2023  Date of Death: Date of Death: 11-21-2023  Time of Death: Time of Death: 11-27-1552  Length of Stay: 3   Principle Cause of death: Severe sepsis secondary to hospital-acquired pneumonia/aspiration.  Hospital Diagnoses: Principal Problem:   Acute respiratory failure with hypoxia (HCC) Active Problems:   AKI (acute kidney injury) (HCC)   Aspiration pneumonitis (HCC)   Diabetes mellitus with diabetic nephropathy, with long-term current use of insulin (HCC)   Type 2 diabetes mellitus with stage 3 chronic kidney disease and hypertension (HCC)   CKD stage 3 due to type 2 diabetes mellitus (HCC)   Acute metabolic encephalopathy   Normal anion gap metabolic acidosis   Hospital-acquired pneumonia  Brief hospital admission course: As per H&P written by Dr. Radonna Ricker on 11-18-2023 Sherry Waller is a 88 y.o. female past medical history of hypothyroidism, depression malignant hypertension diabetes mellitus type 2 insulin-dependent with a last A1c of 6.6, chronic kidney disease stage IIIb recently discharged from the hospital on 2025/02/16for acute kidney injury on chronic kidney disease stage IIIb and malignant hypertension was brought into the hospital with family with shortness of breath and confusion the patient is on BiPAP and relates she feels bad, is slightly confused but as per the son she has been coughing since she was discharged from the hospital and is has not gotten better decreased appetite short of breath for the past 5 days prior to admission and started getting confused the day prior to admission when EMS got there she was satting about 74% on room air and placed on a nonrebreather saturations increased to 92% on 15 L now on BiPAP in the ED.   In the ED: She was found to be afebrile with a white count of 14 and a left  shift, chest x-ray did show increased patchy opacities with persistent bilateral small pleural effusions on arrival to the ED she was found to be hypoxic placed on BiPAP and her saturations are now 98% or greater, and her respiratory rate is around 20, ABG was done that shows a pH of 7.4 1/27/154, bicarb and BMI it was 15, lactic acid was 1.9 PT and INR were 15 and 1.3 blood cultures were sent and she was started empirically on IV vancomycin and cefepime along with azithromycin.  Assessment and Plan: 1-acute respiratory failure with hypoxia -Patient requiring the use of BiPAP to maintain saturation and adequate support. -Appears to be associated with aspiration pneumonia/pneumonitis -Patient very uncomfortable demonstrating difficulty tolerating BiPAP anxious and after almost 20 hours of treatment not demonstrating significant improvement. -Case discussed with patient's sons at bedside who has recommended and are in agreement to transition patient to full comfort care. -BiPAP successfully discontinued; oxygen supplementation for comfort will be provided and end-of-life protocol for air hunger, pain and agitation using Dilaudid, lorazepam and, Haldol and Robinul will be provided. -Patient is actively dying and will pass away in the hospital. -Patient peacefully expire at 1554 with family at bedside.   2-severe sepsis due to hospital-acquired pneumonia/aspiration -Present at time of admission -At this moment transitioning care into full comfort. -No further antibiotics will be provided.   3-acute metabolic encephalopathy -Most likely triggered by the infectious process along with hypoxia -Will focus on symptomatic management and comfort care.   4-chronic kidney disease due to type 2 diabetes -Appears to be stable  and at baseline -No future blood work or labs anticipated -Plan is for full comfort care.   5-type 2 diabetes -Recent A1c 6.6 -No further CBGs monitoring or insulin therapy will be  provided. -Comfort feeding will be allowed.  Procedures: See below for x-ray reports.  Consultations: None  The results of significant diagnostics from this hospitalization (including imaging, microbiology, ancillary and laboratory) are listed below for reference.   Significant Diagnostic Studies: [place] EKG SITE RITE Result Date: 10/29/2023 If Site Rite image not attached, placement could not be confirmed due to current cardiac rhythm.  DG Chest Portable 1 View Result Date: 10/28/2023 CLINICAL DATA:  Shortness of breath.  Respiratory distress. EXAM: PORTABLE CHEST 1 VIEW COMPARISON:  Chest radiograph dated October 15, 2023. CT chest dated October 20, 2023. FINDINGS: Stable cardiomediastinal silhouette. Aortic atherosclerosis. Patchy airspace opacities within the bilateral upper lung zones, appear increased compared to the prior exam dated October 20, 2023, allowing for variations in modality. Small bilateral pleural effusions with left-greater-than-right basilar atelectasis. No pneumothorax. No acute osseous abnormality. IMPRESSION: 1. Increased patchy airspace opacities within the bilateral upper lung zones, favored to reflect an infectious/inflammatory etiology. 2. Persistent small bilateral pleural effusions with left-greater-than-right basilar atelectasis. Electronically Signed   By: Hart Robinsons M.D.   On: 10/28/2023 14:24   CT CHEST WO CONTRAST Result Date: 10/20/2023 CLINICAL DATA:  Hypoxia, cough, shortness of breath EXAM: CT CHEST WITHOUT CONTRAST TECHNIQUE: Multidetector CT imaging of the chest was performed following the standard protocol without IV contrast. RADIATION DOSE REDUCTION: This exam was performed according to the departmental dose-optimization program which includes automated exposure control, adjustment of the mA and/or kV according to patient size and/or use of iterative reconstruction technique. COMPARISON:  None Available. FINDINGS: Cardiovascular: Heart is normal size. Aorta  is normal caliber. Scattered coronary artery and aortic atherosclerosis. Mediastinum/Nodes: Mildly enlarged scattered mediastinal lymph nodes. Right paratracheal lymph node has a short axis diameter of 11 mm. Other similarly sized paratracheal, AP window and subcarinal lymph nodes. No axillary or visible hilar adenopathy. Trachea and thyroid unremarkable. Esophagus is filled with fluid/debris. Lungs/Pleura: Small to moderate bilateral pleural effusions. Compressive atelectasis in the lower lobes. Patchy ground-glass opacities in the upper lobes, likely infectious/inflammatory. Upper Abdomen: No acute findings Musculoskeletal: Chest wall soft tissues are unremarkable. No acute bony abnormality. IMPRESSION: Small to moderate bilateral pleural effusions. Compressive atelectasis in the lower lobes. Patchy ground-glass nodular opacities in the upper lobes, likely infectious/inflammatory. These could be followed after treatment to ensure resolution. Mildly enlarged mediastinal lymph nodes, likely reactive. Coronary artery disease. Esophagus is filled with food debris and fluid a could be related to reflux or dysmotility. Aortic Atherosclerosis (ICD10-I70.0). Electronically Signed   By: Charlett Nose M.D.   On: 10/20/2023 22:07   US Venous Img Upper Uni Left (DVT) Result Date: 10/15/2023 CLINICAL DATA:  Left arm swelling EXAM: LEFT UPPER EXTREMITY VENOUS DOPPLER ULTRASOUND TECHNIQUE: Gray-scale sonography with graded compression, as well as color Doppler and duplex ultrasound were performed to evaluate the upper extremity deep venous system from the level of the subclavian vein and including the jugular, axillary, basilic, radial, ulnar and upper cephalic vein. Spectral Doppler was utilized to evaluate flow at rest and with distal augmentation maneuvers. COMPARISON:  None Available. FINDINGS: Contralateral Subclavian Vein: Respiratory phasicity is normal and symmetric with the symptomatic side. No evidence of thrombus.  Normal compressibility. Internal Jugular Vein: No evidence of thrombus. Normal compressibility, respiratory phasicity and response to augmentation. Subclavian Vein: No evidence of thrombus.  Normal compressibility, respiratory phasicity and response to augmentation. Axillary Vein: No evidence of thrombus. Normal compressibility, respiratory phasicity and response to augmentation. Cephalic Vein: Thrombus is noted with decreased compressibility Basilic Vein: No evidence of thrombus. Normal compressibility, respiratory phasicity and response to augmentation. Brachial Veins: No evidence of thrombus. Normal compressibility, respiratory phasicity and response to augmentation. Radial Veins: No evidence of thrombus. Normal compressibility, respiratory phasicity and response to augmentation. Ulnar Veins: No evidence of thrombus. Normal compressibility, respiratory phasicity and response to augmentation. Venous Reflux:  None visualized. Other Findings:  None visualized. IMPRESSION: No evidence of DVT within the left upper extremity. Superficial cephalic vein thrombus is noted. Electronically Signed   By: Alcide Clever M.D.   On: 10/15/2023 20:32   DG CHEST PORT 1 VIEW Result Date: 10/15/2023 CLINICAL DATA:  Shortness of breath EXAM: PORTABLE CHEST 1 VIEW COMPARISON:  10/11/2023 FINDINGS: Stable cardiomediastinal contours. Aortic atherosclerosis. Chronic coarsened interstitial markings are identified bilateral. New asymmetric opacification within the left base compatible with atelectasis and or airspace disease. There is blunting of the left costophrenic angle which may be due to atelectasis. A pleural effusion is not excluded. IMPRESSION: 1. New asymmetric opacification within the left base compatible with atelectasis and/or airspace disease. 2. Blunting of the left costophrenic angle may be due to a small pleural effusion. 3. Diffuse chronic interstitial coarsening noted bilaterally. Electronically Signed   By: Signa Kell M.D.   On: 10/15/2023 13:09   US RENAL Result Date: 10/12/2023 CLINICAL DATA:  Acute kidney insufficiency EXAM: RENAL / URINARY TRACT ULTRASOUND COMPLETE COMPARISON:  Ultrasound 07/19/2020 FINDINGS: Right Kidney: Renal measurements: 10.5 x 4.6 x 4.5 cm = volume: 112.7 mL. No perinephric fluid. No collecting system dilatation. Left Kidney: Renal measurements: 8.7 x 4.1 x 4.6 cm = volume: 86.9 mL. Slight perinephric fluid. Small anechoic structure associated with kidney measures 10 mm. Simple cyst. The left kidney is echogenic. Bladder: Appears normal for degree of bladder distention. Other: Left kidney not as well seen with overlapping bowel gas and soft tissue. IMPRESSION: No collecting system dilatation.  Small echogenic left kidney. Electronically Signed   By: Karen Kays M.D.   On: 10/12/2023 13:34   NM Pulmonary Perfusion Result Date: 10/11/2023 CLINICAL DATA:  High prob PE suspected, shortness of breath times weeks EXAM: NUCLEAR MEDICINE PERFUSION LUNG SCAN TECHNIQUE: Perfusion images were obtained in multiple projections after intravenous injection of radiopharmaceutical. Ventilation scans intentionally deferred if perfusion scan and chest x-ray adequate for interpretation during COVID 19 epidemic. RADIOPHARMACEUTICALS:  4.4 mCi Tc-39m MAA IV COMPARISON:  Portable chest radiograph from the same day FINDINGS: Heterogenous bilateral distribution of radiopharmaceutical. No discrete segmental or subsegmental perfusion defects. IMPRESSION: Low likelihood ratio for pulmonary embolism. Electronically Signed   By: Corlis Leak M.D.   On: 10/11/2023 13:37   DG Chest Portable 1 View Result Date: 10/11/2023 CLINICAL DATA:  Shortness of breath. EXAM: PORTABLE CHEST 1 VIEW COMPARISON:  10/01/2023 FINDINGS: The lungs are clear without focal pneumonia, edema, pneumothorax or pleural effusion. Interstitial markings are diffusely coarsened with chronic features. The cardio pericardial silhouette is enlarged.  No acute bony abnormality. Telemetry leads overlie the chest. IMPRESSION: Chronic interstitial coarsening without acute cardiopulmonary findings. Electronically Signed   By: Kennith Center M.D.   On: 10/11/2023 07:27   ECHOCARDIOGRAM COMPLETE Result Date: 10/02/2023    ECHOCARDIOGRAM REPORT   Patient Name:   LIDA DOUGALL Date of Exam: 10/02/2023 Medical Rec #:  [id number]       Height:  65.0 in Accession #:    [id number]      Weight:       147.5 lb Date of Birth:  [birth year]       BSA:          1.738 m Patient Age:    87 years        BP:           171/71 mmHg Patient Gender: F               HR:           59 bpm. [facility] Procedure: 2D Echo, Cardiac Doppler and Color Doppler Indications:    Elevated Troponin  History:        Patient has no prior history of Echocardiogram examinations.                 Risk Factors:Hypertensive urgency. DOE p covid 19.  Sonographer:    Celesta Gentile RCS Referring Phys: [id number] Areanna Gengler IMPRESSIONS  1. Left ventricular ejection fraction, by estimation, is 65 to 70%. The left ventricle has normal function. The left ventricle has no regional wall motion abnormalities. There is moderate concentric left ventricular hypertrophy. Left ventricular diastolic parameters are indeterminate.  2. Right ventricular systolic function is normal. The right ventricular size is normal.  3. Left atrial size was mildly dilated.  4. The mitral valve is abnormal. Mild to moderate mitral valve regurgitation. Moderate mitral annular calcification.  5. The aortic valve is tricuspid. There is mild calcification of the aortic valve. Aortic valve regurgitation is not visualized. Aortic valve sclerosis/calcification is present, without any evidence of aortic stenosis.  6. The inferior vena cava is normal in size with greater than 50% respiratory variability, suggesting right atrial pressure of 3 mmHg. Comparison(s): No prior Echocardiogram. FINDINGS  Left Ventricle: Left ventricular  ejection fraction, by estimation, is 65 to 70%. The left ventricle has normal function. The left ventricle has no regional wall motion abnormalities. The left ventricular internal cavity size was normal in size. There is  moderate concentric left ventricular hypertrophy. Left ventricular diastolic parameters are indeterminate. Right Ventricle: The right ventricular size is normal. No increase in right ventricular wall thickness. Right ventricular systolic function is normal. Left Atrium: Left atrial size was mildly dilated. Right Atrium: Right atrial size was not assessed. Pericardium: Trivial pericardial effusion is present. The pericardial effusion is surrounding the apex. Mitral Valve: Calcified and prolapse in the 2 chamber, central regurgitation worst in the 4 chamber. The mitral valve is abnormal. Moderate mitral annular calcification. Mild to moderate mitral valve regurgitation. Tricuspid Valve: The tricuspid valve is normal in structure. Tricuspid valve regurgitation is not demonstrated. No evidence of tricuspid stenosis. Aortic Valve: The aortic valve is tricuspid. There is mild calcification of the aortic valve. Aortic valve regurgitation is not visualized. Aortic valve sclerosis/calcification is present, without any evidence of aortic stenosis. Pulmonic Valve: The pulmonic valve was normal in structure. Pulmonic valve regurgitation is trivial. No evidence of pulmonic stenosis. Aorta: The aortic root is normal in size and structure. Venous: The inferior vena cava is normal in size with greater than 50% respiratory variability, suggesting right atrial pressure of 3 mmHg. IAS/Shunts: The atrial septum is grossly normal.  LEFT VENTRICLE PLAX 2D LVIDd:         4.20 cm   Diastology LVIDs:         2.70 cm   LV e' medial:    2.83 cm/s LV PW:  1.30 cm   LV E/e' medial:  21.8 LV IVS:        1.30 cm   LV e' lateral:   3.15 cm/s LVOT diam:     1.90 cm   LV E/e' lateral: 19.6 LV SV:         63 LV SV Index:   36  LVOT Area:     2.84 cm  RIGHT VENTRICLE RV S prime:     10.70 cm/s TAPSE (M-mode): 1.8 cm LEFT ATRIUM             Index        RIGHT ATRIUM           Index LA diam:        3.60 cm 2.07 cm/m   RA Area:     15.70 cm LA Vol (A2C):   73.2 ml 42.12 ml/m  RA Volume:   39.00 ml  22.44 ml/m LA Vol (A4C):   56.1 ml 32.28 ml/m LA Biplane Vol: 64.5 ml 37.11 ml/m  AORTIC VALVE LVOT Vmax:   102.00 cm/s LVOT Vmean:  66.600 cm/s LVOT VTI:    0.222 m  AORTA Ao Root diam: 3.20 cm MITRAL VALVE MV Area (PHT): 2.83 cm       SHUNTS MV Decel Time: 268 msec       Systemic VTI:  0.22 m MR Peak grad:    139.2 mmHg   Systemic Diam: 1.90 cm MR Mean grad:    86.0 mmHg MR Vmax:         590.00 cm/s MR Vmean:        425.0 cm/s MR PISA:         1.01 cm MR PISA Eff ROA: 5 mm MR PISA Radius:  0.40 cm MV E velocity: 61.70 cm/s MV A velocity: 101.00 cm/s MV E/A ratio:  0.61 Riley Lam MD Electronically signed by Riley Lam MD Signature Date/Time: 10/02/2023/2:56:39 PM    Final     Microbiology: Recent Results (from the past 240 hours)  Culture, blood (Routine x 2)     Status: None (Preliminary result)   Collection Time: 10/28/23  1:31 PM   Specimen: BLOOD RIGHT HAND  Result Value Ref Range Status   Specimen Description BLOOD RIGHT HAND  Final   Special Requests   Final    BOTTLES DRAWN AEROBIC AND ANAEROBIC Blood Culture results may not be optimal due to an inadequate volume of blood received in culture bottles   Culture   Final    NO GROWTH 3 DAYS Performed at [facility], [address], [place], [place] [address]    Report Status PENDING  Incomplete  Culture, blood (Routine x 2)     Status: None (Preliminary result)   Collection Time: 10/28/23  1:51 PM   Specimen: BLOOD RIGHT ARM  Result Value Ref Range Status   Specimen Description BLOOD RIGHT ARM  Final   Special Requests   Final    BOTTLES DRAWN AEROBIC AND ANAEROBIC Blood Culture adequate volume   Culture   Final    NO GROWTH 3  DAYS Performed at [facility], [address] [place], [place] [address]    Report Status PENDING  Incomplete  MRSA Next Gen by PCR, Nasal     Status: None   Collection Time: 10/28/23  5:11 PM   Specimen: Nasal Mucosa; Nasal Swab  Result Value Ref Range Status   MRSA by PCR Next Gen NOT DETECTED NOT DETECTED Final  Comment: (NOTE) The GeneXpert MRSA Assay (FDA approved for NASAL specimens only), is one component of a comprehensive MRSA colonization surveillance program. It is not intended to diagnose MRSA infection nor to guide or monitor treatment for MRSA infections. Test performance is not FDA approved in patients less than 56 years old. Performed at [facility], [address] [place], [place] [address]   Resp panel by RT-PCR (RSV, Flu A&B, Covid) Anterior Nasal Swab     Status: None   Collection Time: 10/28/23  5:20 PM   Specimen: Anterior Nasal Swab  Result Value Ref Range Status   SARS Coronavirus 2 by RT PCR NEGATIVE NEGATIVE Final    Comment: (NOTE) SARS-CoV-2 target nucleic acids are NOT DETECTED.  The SARS-CoV-2 RNA is generally detectable in upper respiratory specimens during the acute phase of infection. The lowest concentration of SARS-CoV-2 viral copies this assay can detect is 138 copies/mL. A negative result does not preclude SARS-Cov-2 infection and should not be used as the sole basis for treatment or other patient management decisions. A negative result may occur with  improper specimen collection/handling, submission of specimen other than nasopharyngeal swab, presence of viral mutation(s) within the areas targeted by this assay, and inadequate number of viral copies(<138 copies/mL). A negative result must be combined with clinical observations, patient history, and epidemiological information. The expected result is Negative.  Fact Sheet for Patients:  BloggerCourse.com  Fact Sheet for Healthcare Providers:   SeriousBroker.it  This test is no t yet approved or cleared by the [place] FDA and  has been authorized for detection and/or diagnosis of SARS-CoV-2 by FDA under an Emergency Use Authorization (EUA). This EUA will remain  in effect (meaning this test can be used) for the duration of the COVID-19 declaration under Section 564(b)(1) of the Act, 21 U.S.C.section 360bbb-3(b)(1), unless the authorization is terminated  or revoked sooner.       Influenza A by PCR NEGATIVE NEGATIVE Final   Influenza B by PCR NEGATIVE NEGATIVE Final    Comment: (NOTE) The Xpert Xpress SARS-CoV-2/FLU/RSV plus assay is intended as an aid in the diagnosis of influenza from Nasopharyngeal swab specimens and should not be used as a sole basis for treatment. Nasal washings and aspirates are unacceptable for Xpert Xpress SARS-CoV-2/FLU/RSV testing.  Fact Sheet for Patients: BloggerCourse.com  Fact Sheet for Healthcare Providers: SeriousBroker.it  This test is not yet approved or cleared by the [place] FDA and has been authorized for detection and/or diagnosis of SARS-CoV-2 by FDA under an Emergency Use Authorization (EUA). This EUA will remain in effect (meaning this test can be used) for the duration of the COVID-19 declaration under Section 564(b)(1) of the Act, 21 U.S.C. section 360bbb-3(b)(1), unless the authorization is terminated or revoked.     Resp Syncytial Virus by PCR NEGATIVE NEGATIVE Final    Comment: (NOTE) Fact Sheet for Patients: BloggerCourse.com  Fact Sheet for Healthcare Providers: SeriousBroker.it  This test is not yet approved or cleared by the [place] FDA and has been authorized for detection and/or diagnosis of SARS-CoV-2 by FDA under an Emergency Use Authorization (EUA). This EUA will remain in effect (meaning this test can be used) for  the duration of the COVID-19 declaration under Section 564(b)(1) of the Act, 21 U.S.C. section 360bbb-3(b)(1), unless the authorization is terminated or revoked.  Performed at [facility], [address], [place], [place] [address]   Culture, blood (Routine X 2) w Reflex to ID Panel     Status: None (  Preliminary result)   Collection Time: 10/28/23  7:04 PM   Specimen: Left Antecubital; Blood  Result Value Ref Range Status   Specimen Description   Final    LEFT ANTECUBITAL BOTTLES DRAWN AEROBIC AND ANAEROBIC   Special Requests Blood Culture adequate volume  Final   Culture   Final    NO GROWTH 3 DAYS Performed at [facility], [address] [place], [place] [address]    Report Status PENDING  Incomplete  Culture, blood (Routine X 2) w Reflex to ID Panel     Status: None (Preliminary result)   Collection Time: 10/28/23  7:09 PM   Specimen: BLOOD LEFT ARM  Result Value Ref Range Status   Specimen Description BLOOD LEFT ARM BOTTLES DRAWN AEROBIC AND ANAEROBIC  Final   Special Requests Blood Culture adequate volume  Final   Culture   Final    NO GROWTH 3 DAYS Performed at [facility], [address], [place], [place] [address]    Report Status PENDING  Incomplete    Time spent: 40 minutes  Signed: Vassie Loll, MD

## 2023-11-13 DEATH — deceased
